# Patient Record
Sex: Male | Born: 1990 | Race: White | Hispanic: No | Marital: Single | Smoking: Never smoker
Health system: Southern US, Community
[De-identification: ages and names within clinical notes are randomized; demographics above are authoritative.]

## PROBLEM LIST (undated history)

## (undated) DIAGNOSIS — F988 Other specified behavioral and emotional disorders with onset usually occurring in childhood and adolescence: Secondary | ICD-10-CM

## (undated) DIAGNOSIS — F32A Depression, unspecified: Secondary | ICD-10-CM

## (undated) DIAGNOSIS — J302 Other seasonal allergic rhinitis: Secondary | ICD-10-CM

## (undated) DIAGNOSIS — F329 Major depressive disorder, single episode, unspecified: Secondary | ICD-10-CM

## (undated) DIAGNOSIS — F419 Anxiety disorder, unspecified: Secondary | ICD-10-CM

## (undated) HISTORY — DX: Other specified behavioral and emotional disorders with onset usually occurring in childhood and adolescence: F98.8

---
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
## 2012-02-10 ENCOUNTER — Emergency Department (HOSPITAL_COMMUNITY)
Admission: EM | Admit: 2012-02-10 | Discharge: 2012-02-10 | Disposition: A | Discharge status: Home / Self Care | Payer: Medicaid Other | Attending: Emergency Medicine | Admitting: Emergency Medicine

## 2012-02-10 ENCOUNTER — Encounter (HOSPITAL_COMMUNITY): Payer: Self-pay | Admitting: *Deleted

## 2012-02-10 DIAGNOSIS — W261XXA Contact with sword or dagger, initial encounter: Secondary | ICD-10-CM | POA: Insufficient documentation

## 2012-02-10 DIAGNOSIS — W260XXA Contact with knife, initial encounter: Secondary | ICD-10-CM | POA: Insufficient documentation

## 2012-02-10 DIAGNOSIS — S61209A Unspecified open wound of unspecified finger without damage to nail, initial encounter: Secondary | ICD-10-CM | POA: Insufficient documentation

## 2012-02-10 DIAGNOSIS — IMO0001 Reserved for inherently not codable concepts without codable children: Secondary | ICD-10-CM

## 2012-02-10 DIAGNOSIS — Z23 Encounter for immunization: Secondary | ICD-10-CM | POA: Insufficient documentation

## 2012-02-10 MED ORDER — BACITRACIN-NEOMYCIN-POLYMYXIN 400-5-5000 EX OINT
TOPICAL_OINTMENT | Freq: Once | CUTANEOUS | Status: AC
  Administered 2012-02-10: 1 via TOPICAL
  Filled 2012-02-10: qty 1

## 2012-02-10 MED ORDER — LIDOCAINE HCL (PF) 1 % IJ SOLN
INTRAMUSCULAR | Status: AC
  Administered 2012-02-10: 17:00:00
  Filled 2012-02-10: qty 5

## 2012-02-10 MED ORDER — TETANUS-DIPHTH-ACELL PERTUSSIS 5-2.5-18.5 LF-MCG/0.5 IM SUSP
0.5000 mL | Freq: Once | INTRAMUSCULAR | Status: AC
  Administered 2012-02-10: 0.5 mL via INTRAMUSCULAR
  Filled 2012-02-10: qty 0.5

## 2012-02-10 NOTE — ED Notes (Addendum)
C/o laceration to left index finger from sharpening a knife. Bleeding controlled at triage

## 2012-02-10 NOTE — ED Notes (Signed)
Discharge instructions reviewed with pt, questions answered. Pt verbalized understanding.  

## 2012-02-10 NOTE — ED Provider Notes (Signed)
Medical screening examination/treatment/procedure(s) were performed by non-physician practitioner and as supervising physician I was immediately available for consultation/collaboration.  Raeford Razor, MD 02/10/12 2044

## 2012-02-10 NOTE — ED Provider Notes (Signed)
History     CSN: [id number]  Arrival date & time 02/10/12  1356   First MD Initiated Contact with Patient 02/10/12 1453      Chief Complaint  Patient presents with  . Extremity Laceration    (Consider location/radiation/quality/duration/timing/severity/associated sxs/prior treatment) HPI Comments: Cut L second finger while sharpening his pocket knife.  ? Last dT.  The history is provided by the patient. No language interpreter was used.    History reviewed. No pertinent past medical history.  History reviewed. No pertinent past surgical history.  No family history on file.  History  Substance Use Topics  . Smoking status: Never Smoker   . Smokeless tobacco: Current User    Types: Chew  . Alcohol Use: No      Review of Systems  Constitutional: Negative for fever and chills.  Skin: Positive for wound.  Neurological: Negative for weakness and numbness.  All other systems reviewed and are negative.    Allergies  Iohexol  Home Medications   Current Outpatient Rx  Name Route Sig Dispense Refill  . CETIRIZINE HCL 10 MG PO TABS Oral Take 10 mg by mouth daily.      BP 115/57  Pulse 65  Temp 98.3 F (36.8 C) (Oral)  Resp 18  Ht 5\' 10"  (1.778 m)  Wt 133 lb (60.328 kg)  BMI 19.08 kg/m2  SpO2 100%  Physical Exam  Nursing note and vitals reviewed. Constitutional: He is oriented to person, place, and time. He appears well-developed and well-nourished.  HENT:  Head: Normocephalic and atraumatic.  Eyes: EOM are normal.  Neck: Normal range of motion.  Cardiovascular: Normal rate, regular rhythm, normal heart sounds and intact distal pulses.   Pulmonary/Chest: Effort normal and breath sounds normal. No respiratory distress.  Abdominal: Soft. He exhibits no distension. There is no tenderness.  Musculoskeletal: Normal range of motion. He exhibits tenderness.       Left hand: He exhibits tenderness and laceration. He exhibits normal range of motion, no bony  tenderness, normal capillary refill, no deformity and no swelling. normal sensation noted. Normal strength noted.       Hands: Neurological: He is alert and oriented to person, place, and time.  Skin: Skin is warm and dry.  Psychiatric: He has a normal mood and affect. Judgment normal.    ED Course  LACERATION REPAIR Date/Time: 02/10/2012 4:00 PM Performed by: Evalina Field Authorized by: Evalina Field Consent: Verbal consent obtained. Written consent not obtained. Consent given by: patient Patient understanding: patient states understanding of the procedure being performed Patient consent: the patient's understanding of the procedure matches consent given Site marked: the operative site was not marked Imaging studies: imaging studies not available Patient identity confirmed: verbally with patient Time out: Immediately prior to procedure a "time out" was called to verify the correct patient, procedure, equipment, support staff and site/side marked as required. Laceration length: 2 cm Foreign bodies: no foreign bodies Tendon involvement: none Nerve involvement: none Vascular damage: no Anesthesia: local infiltration Local anesthetic: lidocaine 1% without epinephrine Anesthetic total: 2 ml Patient sedated: no Preparation: Patient was prepped and draped in the usual sterile fashion. Irrigation solution: saline Irrigation method: syringe Amount of cleaning: standard Debridement: none Degree of undermining: none Skin closure: 4-0 nylon Number of sutures: 4 Approximation: close Approximation difficulty: simple Dressing: 4x4 sterile gauze and antibiotic ointment Patient tolerance: Patient tolerated the procedure well with no immediate complications.   (including critical care time)  Labs Reviewed - No data to display  No results found.   1. Laceration of second finger, left       MDM  Simple laceration repair. Wash/abx oint BID Suture removal in 8-10  days.        Evalina Field, [place] 02/10/12 [phone]

## 2012-04-18 ENCOUNTER — Emergency Department (HOSPITAL_COMMUNITY)
Admission: EM | Admit: 2012-04-18 | Discharge: 2012-04-18 | Disposition: A | Discharge status: Home / Self Care | Payer: Medicaid Other | Attending: Emergency Medicine | Admitting: Emergency Medicine

## 2012-04-18 ENCOUNTER — Encounter (HOSPITAL_COMMUNITY): Payer: Self-pay | Admitting: Emergency Medicine

## 2012-04-18 DIAGNOSIS — S058X9A Other injuries of unspecified eye and orbit, initial encounter: Secondary | ICD-10-CM | POA: Insufficient documentation

## 2012-04-18 DIAGNOSIS — H538 Other visual disturbances: Secondary | ICD-10-CM | POA: Insufficient documentation

## 2012-04-18 DIAGNOSIS — J301 Allergic rhinitis due to pollen: Secondary | ICD-10-CM | POA: Insufficient documentation

## 2012-04-18 DIAGNOSIS — Y929 Unspecified place or not applicable: Secondary | ICD-10-CM | POA: Insufficient documentation

## 2012-04-18 DIAGNOSIS — Y939 Activity, unspecified: Secondary | ICD-10-CM | POA: Insufficient documentation

## 2012-04-18 DIAGNOSIS — S0500XA Injury of conjunctiva and corneal abrasion without foreign body, unspecified eye, initial encounter: Secondary | ICD-10-CM

## 2012-04-18 DIAGNOSIS — X58XXXA Exposure to other specified factors, initial encounter: Secondary | ICD-10-CM | POA: Insufficient documentation

## 2012-04-18 HISTORY — DX: Other seasonal allergic rhinitis: J30.2

## 2012-04-18 MED ORDER — GENTAMICIN SULFATE 0.3 % OP SOLN: 2.0000 [drp] | OPHTHALMIC | Status: DC

## 2012-04-18 NOTE — ED Provider Notes (Signed)
History   This chart was scribed for Geoffery Lyons, MD by Toya Smothers, ED Scribe. The patient was seen in room APA10/APA10. Patient's care was started at 0649.  CSN: [id number]  Arrival date & time 04/18/12  7829   First MD Initiated Contact with Patient 04/18/12 0710      Chief Complaint  Patient presents with  . Eye Pain   Patient is a 21 y.o. male presenting with eye pain. The history is provided by the patient. No language interpreter was used.  Eye Pain This is a new problem. The current episode started 2 days ago. The problem occurs constantly. The problem has not changed since onset.Pertinent negatives include no chest pain, no abdominal pain, no headaches and no shortness of breath. Nothing aggravates the symptoms. Nothing relieves the symptoms. He has tried water for the symptoms. The treatment provided no relief.    Alan Hess is a 21 y.o. male who presents to the Emergency Department complaining of 2 days of new, constant, unchanged, moderate left eye pain with associated blurred vision after possible foreign body. Pain is worse in the morning and Pt denotes associated clear discharge. Symptoms have not been treated PTA. No fever, chills, cough, congestion, rhinorrhea, chest pain, SOB, or n/v/d.   Past Medical History  Diagnosis Date  . Seasonal allergies     No past surgical history on file.  No family history on file.  History  Substance Use Topics  . Smoking status: Never Smoker   . Smokeless tobacco: Current User    Types: Chew  . Alcohol Use: No    Review of Systems  Eyes: Positive for pain. Negative for photophobia.  Respiratory: Negative for shortness of breath.   Cardiovascular: Negative for chest pain.  Gastrointestinal: Negative for abdominal pain.  Neurological: Negative for headaches.  All other systems reviewed and are negative.    Allergies  Iohexol  Home Medications   Current Outpatient Rx  Name  Route  Sig  Dispense  Refill  .  CETIRIZINE HCL 10 MG PO TABS   Oral   Take 10 mg by mouth daily.           BP 138/71  Pulse 114  Temp 97.7 F (36.5 C) (Oral)  Resp 16  Ht 5\' 10"  (1.778 m)  Wt 130 lb (58.968 kg)  BMI 18.65 kg/m2  SpO2 98%  Physical Exam  Nursing note and vitals reviewed. Constitutional:       Awake, alert, nontoxic appearance.  HENT:  Head: Atraumatic.  Eyes: Pupils are equal, round, and reactive to light. Right eye exhibits no discharge. Left eye exhibits no discharge.       Left cornea has a small abrasion centrally located. There is mild discharge and injection of the conjunctiva.  Neck: Neck supple.  Pulmonary/Chest: Effort normal. He exhibits no tenderness.  Abdominal: Soft. There is no tenderness. There is no rebound.  Musculoskeletal: He exhibits no tenderness.       Baseline ROM, no obvious new focal weakness.  Neurological:       Mental status and motor strength appears baseline for patient and situation.  Skin: No rash noted.  Psychiatric: He has a normal mood and affect.    ED Course  Procedures DIAGNOSTIC STUDIES: Oxygen Saturation is 98% on room air, normal by my interpretation.    COORDINATION OF CARE: 19:15- Evaluated Pt. Pt is awake, alert, and without distress 19:20- Patient informed of clinical course, understand medical decision-making process, and agree with  plan.    Labs Reviewed - No data to display No results found.   No diagnosis found.    MDM  Looks to have a corneal abrasion.  Will treat with antibiotic drops.  To discharge and return prn.    I personally performed the services described in this documentation, which was scribed in my presence. The recorded information has been reviewed and is accurate.         Geoffery Lyons, MD 04/18/12 [phone]

## 2012-04-18 NOTE — ED Notes (Signed)
Pt getting leaves up two days ago and feels like something is in eye. Has flushed it out but pain and irritation remains. Wakes up with drainage making it difficult to see.

## 2012-05-08 ENCOUNTER — Emergency Department (HOSPITAL_COMMUNITY)
Admission: EM | Admit: 2012-05-08 | Discharge: 2012-05-08 | Disposition: A | Discharge status: Home / Self Care | Payer: Medicaid Other | Attending: Emergency Medicine | Admitting: Emergency Medicine

## 2012-05-08 ENCOUNTER — Encounter (HOSPITAL_COMMUNITY): Payer: Self-pay | Admitting: Emergency Medicine

## 2012-05-08 DIAGNOSIS — L739 Follicular disorder, unspecified: Secondary | ICD-10-CM

## 2012-05-08 DIAGNOSIS — Y929 Unspecified place or not applicable: Secondary | ICD-10-CM | POA: Insufficient documentation

## 2012-05-08 DIAGNOSIS — Y939 Activity, unspecified: Secondary | ICD-10-CM | POA: Insufficient documentation

## 2012-05-08 DIAGNOSIS — L738 Other specified follicular disorders: Secondary | ICD-10-CM | POA: Insufficient documentation

## 2012-05-08 DIAGNOSIS — Z79899 Other long term (current) drug therapy: Secondary | ICD-10-CM | POA: Insufficient documentation

## 2012-05-08 MED ORDER — DOXYCYCLINE HYCLATE 100 MG PO CAPS: 100.0000 mg | ORAL_CAPSULE | Freq: Two times a day (BID) | ORAL | Status: DC

## 2012-05-08 NOTE — ED Provider Notes (Signed)
History  This chart was scribed for Alan Co, MD by Ardeen Jourdain, ED Scribe. This patient was seen in room APA07/APA07 and the patient's care was started at 0730.  CSN: [id number]  Arrival date & time 05/08/12  2952   First MD Initiated Contact with Patient 05/08/12 0730      Chief Complaint  Patient presents with  . Insect Bite    The history is provided by the patient. No language interpreter was used.    Alan Hess is a 21 y.o. male who presents to the Emergency Department complaining of gradually improving an area of redness on his left anterior lower leg with associated fever, soreness and swelling . He states the area of redness began 1 day ago. He denies seeing a spider bite the area. He reports putting "fat back" with no relief from the swelling. He states the area has been improved by nothing. Pt does not have a h/o any pertinent or chronic medical conditions. Pt denies smoking and alcohol use.   Past Medical History  Diagnosis Date  . Seasonal allergies     History reviewed. No pertinent past surgical history.  History reviewed. No pertinent family history.  History  Substance Use Topics  . Smoking status: Never Smoker   . Smokeless tobacco: Current User    Types: Chew  . Alcohol Use: No      Review of Systems  All other systems reviewed and are negative.  A complete 10 system review of systems was obtained and all systems are negative except as noted in the HPI and PMH.    Allergies  Iohexol  Home Medications   Current Outpatient Rx  Name  Route  Sig  Dispense  Refill  . CETIRIZINE HCL 10 MG PO TABS   Oral   Take 10 mg by mouth daily.         . GENTAMICIN SULFATE 0.3 % OP SOLN   Left Eye   Place 2 drops into the left eye every 4 (four) hours.   5 mL   0     Triage Vitals: BP 122/64  Pulse 67  Temp 98.6 F (37 C) (Oral)  Resp 17  SpO2 97%  Physical Exam  Constitutional: He is oriented to person, place, and time. He  appears well-developed and well-nourished.  HENT:  Head: Normocephalic.  Eyes: EOM are normal.  Neck: Normal range of motion.  Pulmonary/Chest: Effort normal.  Abdominal: He exhibits no distension.  Musculoskeletal: Normal range of motion.  Neurological: He is alert and oriented to person, place, and time.  Skin: Skin is warm and dry. There is erythema.       1.5 cm area of erythema to the left anterior midline tibia. No fluctuation with mild tenderness, no drainage, no spreading erythema   Psychiatric: He has a normal mood and affect.    ED Course  Procedures (including critical care time)  DIAGNOSTIC STUDIES: Oxygen Saturation is 97% on room air, normal by my interpretation.    COORDINATION OF CARE:  7:43 AM: Discussed treatment plan which includes ibuprofen and warm compresses with pt at bedside and pt agreed to plan.    Labs Reviewed - No data to display No results found.   1. Folliculitis       MDM  The patient has evidence of folliculitis on his left anterior tibia.  No secondary signs of spreading erythema.  No systemic symptoms.  No abscess at this time requiring incision and drainage.  Warm compresses and antibiotics.  Instructed to return to the ER for new or worsening symptoms      I personally performed the services described in this documentation, which was scribed in my presence. The recorded information has been reviewed and is accurate.      Alan Co, MD 05/08/12 [phone]

## 2012-05-08 NOTE — ED Notes (Signed)
Pt noticed bump on L anterior lower leg yesterday. States was swollen but put fatback on it and swelling came down. Area is red and size of quarter. nad

## 2012-08-22 ENCOUNTER — Encounter: Payer: Self-pay | Admitting: Family Medicine

## 2012-08-22 ENCOUNTER — Telehealth: Payer: Self-pay | Admitting: Family Medicine

## 2012-08-22 DIAGNOSIS — F988 Other specified behavioral and emotional disorders with onset usually occurring in childhood and adolescence: Secondary | ICD-10-CM | POA: Insufficient documentation

## 2012-08-22 DIAGNOSIS — J302 Other seasonal allergic rhinitis: Secondary | ICD-10-CM | POA: Insufficient documentation

## 2012-08-22 NOTE — Telephone Encounter (Signed)
ntbs

## 2012-08-22 NOTE — Telephone Encounter (Signed)
Please call patient and schedule appt to have medication refilled per providers request. 

## 2012-08-22 NOTE — Telephone Encounter (Signed)
Need approval for controlled medication.  Last office visit for follow up for ADD 12/2011.  Last refill was 07/16/2012. Adderral 20mg  BID

## 2012-08-25 ENCOUNTER — Ambulatory Visit: Payer: Self-pay | Admitting: Physician Assistant

## 2012-08-25 NOTE — Telephone Encounter (Signed)
Pt is coming in for appt today (08/25/12).Oren Section

## 2012-08-27 ENCOUNTER — Ambulatory Visit (INDEPENDENT_AMBULATORY_CARE_PROVIDER_SITE_OTHER): Payer: Medicaid Other | Admitting: Physician Assistant

## 2012-08-27 ENCOUNTER — Encounter: Payer: Self-pay | Admitting: Physician Assistant

## 2012-08-27 VITALS — BP 130/74 | HR 72 | Temp 98.8°F | Resp 20 | Ht 69.0 in | Wt 137.0 lb

## 2012-08-27 DIAGNOSIS — F988 Other specified behavioral and emotional disorders with onset usually occurring in childhood and adolescence: Secondary | ICD-10-CM

## 2012-08-27 MED ORDER — AMPHETAMINE-DEXTROAMPHETAMINE 20 MG PO TABS: 20.0000 mg | ORAL_TABLET | Freq: Two times a day (BID) | ORAL | Status: DC

## 2012-08-27 NOTE — Progress Notes (Signed)
   Patient ID: Alan Hess MRN: [id number], DOB: 12-16-1990, 22 y.o. Date of Encounter: 08/27/2012, 5:10 PM    Chief Complaint:  Chief Complaint  Patient presents with  . Medication Refill    Adderall     HPI: 22 y.o. year old male here to f/u ADD. On Adderall 20mg  BID. Says this is working well. 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 This job pays more so changed jobs. Is "putting off getting GED" b/c busy with a lot of work hours. 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 Current Adderall still works well for him. Able to focus and pay attention to his work and complete tasks. Is eating protein bars, drinking protein drinks, trying to gain weight.  No insomnia, palitation, chest pain, abdominal pain.    Home Meds: Current Outpatient Prescriptions on File Prior to Visit  Medication Sig Dispense Refill  . cetirizine (ZYRTEC) 10 MG tablet Take 10 mg by mouth daily.      Marland Kitchen doxycycline (VIBRAMYCIN) 100 MG capsule Take 1 capsule (100 mg total) by mouth 2 (two) times daily.  14 capsule  0  . gentamicin (GARAMYCIN) 0.3 % ophthalmic solution Place 2 drops into the left eye every 4 (four) hours.  5 mL  0   No current facility-administered medications on file prior to visit.    Allergies:  Allergies  Allergen Reactions  . Iohexol      Code: HIVES, Desc: became itchy after injection, Onset Date: [id number]       Review of Systems: Constitutional: negative for chills, fever, night sweats, weight changes, or fatigue  HEENT: negative for vision changes, hearing loss, congestion, rhinorrhea, ST, epistaxis, or sinus pressure Cardiovascular: negative for chest pain or palpitations Respiratory: negative for hemoptysis, wheezing, shortness of breath, or cough Abdominal: negative for abdominal pain, nausea, vomiting, diarrhea, or constipation Dermatological: negative for rash Neurologic: negative for headache, dizziness, or  syncope    Physical Exam: Blood pressure 130/74, pulse 72, temperature 98.8 F (37.1 C), temperature source Oral, resp. rate 20, height 5\' 9"  (1.753 m), weight 137 lb (62.143 kg)., Body mass index is 20.22 kg/(m^2). General: Well developed, well nourished, in no acute distress. Neck: Supple. No thyromegaly. Full ROM. No lymphadenopathy. Lungs: Clear bilaterally to auscultation without wheezes, rales, or rhonchi. Breathing is unlabored. Heart: RRR with S1 S2. No murmurs, rubs, or gallops appreciated. Msk:  Strength and tone normal for age. Extremities/Skin: Warm and dry. No clubbing or cyanosis. No edema. No rashes or suspicious lesions. Neuro: Alert and oriented X 3. Moves all extremities spontaneously. Gait is normal. CNII-XII grossly in tact. Psych:  Responds to questions appropriately with a normal affect.    ASSESSMENT AND PLAN:  22 y.o. year old male with  1. ADD (attention deficit disorder) Cont Adderall 20mg  BID # 60 / 0. I gave him 2 Prescriptions today. One for today, one to fill 09/27/12.  ROV 6 months   Murray Hodgkins [place], [place], [facility] 08/27/2012 5:10 PM

## 2012-09-01 ENCOUNTER — Ambulatory Visit (INDEPENDENT_AMBULATORY_CARE_PROVIDER_SITE_OTHER): Payer: Medicaid Other | Admitting: Physician Assistant

## 2012-09-01 VITALS — BP 110/66 | HR 100 | Temp 98.5°F | Resp 18 | Ht 69.0 in | Wt 134.0 lb

## 2012-09-01 DIAGNOSIS — G4700 Insomnia, unspecified: Secondary | ICD-10-CM

## 2012-09-01 MED ORDER — ZOLPIDEM TARTRATE 5 MG PO TABS: 5.0000 mg | ORAL_TABLET | Freq: Every evening | ORAL | Status: DC | PRN

## 2012-09-02 NOTE — Progress Notes (Signed)
Patient ID: Alan Hess MRN: [id number], DOB: 06/21/1990, 22 y.o. Date of Encounter: 09/02/2012, 8:46 AM   Chief Complaint:  Chief Complaint  Patient presents with  . c/o insomnia  maybe get 2-3hrs a night  headaches, mind raci    HPI: 22 y.o. year old male is having problems with insomnia. Says he has "always had a problem getting really good sleep" but it is worse recently. Takes a while to fall asleep and even after asleep, does not get a good sleep. Tosses and turns a lot. Sometimes completely awakens during the night after falling asleep. Was having problems with this prior to starting Adderall. Also, has been on Adderall a long time now but the insomnia has only recently worsened. Takes first dose of Adderall at 5:15 am before work. Takes 2nd dose at 12:30 during lunch break.  Has tried otc "natural supplement called Dream Water" but this did not help. Relaxes prior to bed time. Consumes no caffeine after 4pm.     Past Medical History  Diagnosis Date  . Seasonal allergies   . ADD (attention deficit disorder)      Home Meds: Current Outpatient Prescriptions on File Prior to Visit  Medication Sig Dispense Refill  . amphetamine-dextroamphetamine (ADDERALL) 20 MG tablet Take 1 tablet (20 mg total) by mouth 2 (two) times daily.  60 tablet  0  . cetirizine (ZYRTEC) 10 MG tablet Take 10 mg by mouth daily.      Marland Kitchen doxycycline (VIBRAMYCIN) 100 MG capsule Take 1 capsule (100 mg total) by mouth 2 (two) times daily.  14 capsule  0  . gentamicin (GARAMYCIN) 0.3 % ophthalmic solution Place 2 drops into the left eye every 4 (four) hours.  5 mL  0  . loratadine (CLARITIN) 10 MG tablet Take 10 mg by mouth daily.       No current facility-administered medications on file prior to visit.    Allergies:  Allergies  Allergen Reactions  . Iohexol      Code: HIVES, Desc: became itchy after injection, Onset Date: [id number]     History   Social History  . Marital Status: Single     Spouse Name: N/A    Number of Children: N/A  . Years of Education: N/A   Occupational History  . Not on file.   Social History Main Topics  . Smoking status: Never Smoker   . Smokeless tobacco: Current User    Types: Chew  . Alcohol Use: No  . Drug Use: No  . Sexually Active:    Other Topics Concern  . Not on file   Social History Narrative  . No narrative on file     Review of Systems: Constitutional: negative for chills, fever, night sweats, weight changes, or fatigue  HEENT: negative for vision changes or hearing loss Cardiovascular: negative for chest pain or palpitations Respiratory: negative for hemoptysis, wheezing, shortness of breath, or cough Dermatological: negative for rash Neurologic: negative for headache, dizziness, or syncope All other systems reviewed and are otherwise negative with the exception to those above and in the HPI.   Physical Exam: Blood pressure 110/66, pulse 100, temperature 98.5 F (36.9 C), temperature source Oral, resp. rate 18, height 5\' 9"  (1.753 m), weight 134 lb (60.782 kg)., Body mass index is 19.78 kg/(m^2). General: Well developed, well nourished, in no acute distress. Neck: Supple. No thyromegaly. Full ROM. No lymphadenopathy. Lungs: Clear bilaterally to auscultation without wheezes, rales, or rhonchi. Breathing is unlabored. Heart: Regular rhythm.  No murmurs, rubs, or gallops. Msk:  Strength and tone normal for age. Extremities/Skin: Warm and dry. No clubbing or cyanosis. No edema. No rashes or suspicious lesions. Neuro: Alert and oriented X 3. Moves all extremities spontaneously. Gait is normal. CNII-XII grossly in tact. Psych:  Responds to questions appropriately with a normal affect.     ASSESSMENT AND PLAN:  22 y.o. year old male with  1. Insomnia - zolpidem (AMBIEN) 5 MG tablet; Take 1 tablet (5 mg total) by mouth at bedtime as needed for sleep.  Dispense: 15 tablet; Refill: 1   Signed,  [address] [place], [place],  [facility] 09/02/2012 8:46 AM

## 2012-09-22 ENCOUNTER — Ambulatory Visit: Payer: Medicaid Other | Admitting: Physician Assistant

## 2012-10-16 ENCOUNTER — Telehealth: Payer: Self-pay | Admitting: Physician Assistant

## 2012-10-16 MED ORDER — AMPHETAMINE-DEXTROAMPHETAMINE 20 MG PO TABS: 20.0000 mg | ORAL_TABLET | Freq: Two times a day (BID) | ORAL | Status: DC

## 2012-10-16 NOTE — Telephone Encounter (Addendum)
Just seen 08/27/12.  Last refill 08/27/12.  Rx printed for provider signature.  Pt called told ready for pick up in AM

## 2012-10-17 NOTE — Telephone Encounter (Signed)
Rx printed, signed, approved.

## 2012-10-28 ENCOUNTER — Encounter (HOSPITAL_COMMUNITY): Payer: Self-pay

## 2012-10-28 ENCOUNTER — Telehealth: Payer: Self-pay | Admitting: Family Medicine

## 2012-10-28 ENCOUNTER — Emergency Department (HOSPITAL_COMMUNITY)
Admission: EM | Admit: 2012-10-28 | Discharge: 2012-10-28 | Disposition: A | Discharge status: Home / Self Care | Payer: Self-pay | Attending: Emergency Medicine | Admitting: Emergency Medicine

## 2012-10-28 DIAGNOSIS — Y9389 Activity, other specified: Secondary | ICD-10-CM | POA: Insufficient documentation

## 2012-10-28 DIAGNOSIS — T622X1A Toxic effect of other ingested (parts of) plant(s), accidental (unintentional), initial encounter: Secondary | ICD-10-CM | POA: Insufficient documentation

## 2012-10-28 DIAGNOSIS — F988 Other specified behavioral and emotional disorders with onset usually occurring in childhood and adolescence: Secondary | ICD-10-CM | POA: Insufficient documentation

## 2012-10-28 DIAGNOSIS — Z79899 Other long term (current) drug therapy: Secondary | ICD-10-CM | POA: Insufficient documentation

## 2012-10-28 DIAGNOSIS — Y9289 Other specified places as the place of occurrence of the external cause: Secondary | ICD-10-CM | POA: Insufficient documentation

## 2012-10-28 DIAGNOSIS — L255 Unspecified contact dermatitis due to plants, except food: Secondary | ICD-10-CM | POA: Insufficient documentation

## 2012-10-28 MED ORDER — DEXAMETHASONE SODIUM PHOSPHATE 4 MG/ML IJ SOLN
10.0000 mg | Freq: Once | INTRAMUSCULAR | Status: AC
  Administered 2012-10-28: 10 mg via INTRAMUSCULAR
  Filled 2012-10-28: qty 3

## 2012-10-28 MED ORDER — PREDNISONE 10 MG PO TABS: ORAL_TABLET | ORAL | Status: DC

## 2012-10-28 NOTE — Telephone Encounter (Signed)
Yes pt seen in ER

## 2012-10-28 NOTE — ED Notes (Signed)
Pt reports broke out in rash from poison oak on arms and abd x 3 days.  Has been using topical cream without relief.

## 2012-10-28 NOTE — Telephone Encounter (Signed)
Looks like he went to er today, didn't he? If not, he can have prednisone taper pack.

## 2012-10-28 NOTE — ED Provider Notes (Signed)
History     CSN: [id number]  Arrival date & time 10/28/12  1157   First MD Initiated Contact with Patient 10/28/12 1222      Chief Complaint  Patient presents with  . Poison Oak    (Consider location/radiation/quality/duration/timing/severity/associated sxs/prior treatment) HPI Comments: Alan Hess is a 22 y.o. male who presents to the Emergency Department complaining of rash and itching to his  Bilateral arms and abdomen for 3 days.  States the rash began after he was moving bricks outside and he believes it is related to poison oak exposure.  States rash began weeping fluids last evening.  States he has been using Calamine lotion w/o relief. He denies fever,chills, swelling or pain  The history is provided by the patient.    Past Medical History  Diagnosis Date  . Seasonal allergies   . ADD (attention deficit disorder)     History reviewed. No pertinent past surgical history.  No family history on file.  History  Substance Use Topics  . Smoking status: Never Smoker   . Smokeless tobacco: Current User    Types: Chew  . Alcohol Use: No      Review of Systems  Constitutional: Negative for fever, chills, activity change and appetite change.  HENT: Negative for sore throat, facial swelling, trouble swallowing, neck pain and neck stiffness.   Respiratory: Negative for chest tightness, shortness of breath and wheezing.   Gastrointestinal: Negative for nausea and vomiting.  Musculoskeletal: Negative for myalgias and arthralgias.  Skin: Positive for rash. Negative for color change and wound.  Neurological: Negative for dizziness, weakness, numbness and headaches.  All other systems reviewed and are negative.    Allergies  Iohexol  Home Medications   Current Outpatient Rx  Name  Route  Sig  Dispense  Refill  . amphetamine-dextroamphetamine (ADDERALL) 20 MG tablet   Oral   Take 1 tablet (20 mg total) by mouth 2 (two) times daily.   60 tablet   0   .  cetirizine (ZYRTEC) 10 MG tablet   Oral   Take 10 mg by mouth daily.         Marland Kitchen doxycycline (VIBRAMYCIN) 100 MG capsule   Oral   Take 1 capsule (100 mg total) by mouth 2 (two) times daily.   14 capsule   0   . gentamicin (GARAMYCIN) 0.3 % ophthalmic solution   Left Eye   Place 2 drops into the left eye every 4 (four) hours.   5 mL   0   . loratadine (CLARITIN) 10 MG tablet   Oral   Take 10 mg by mouth daily.         Marland Kitchen zolpidem (AMBIEN) 5 MG tablet   Oral   Take 1 tablet (5 mg total) by mouth at bedtime as needed for sleep.   15 tablet   1     BP 130/70  Pulse 84  Temp(Src) 97.1 F (36.2 C) (Oral)  Resp 20  Ht 5\' 10"  (1.778 m)  Wt 140 lb (63.504 kg)  BMI 20.09 kg/m2  Physical Exam  Nursing note and vitals reviewed. Constitutional: He is oriented to person, place, and time. He appears well-developed and well-nourished. No distress.  HENT:  Head: Normocephalic and atraumatic.  Mouth/Throat: Oropharynx is clear and moist.  Neck: Normal range of motion. Neck supple.  Cardiovascular: Normal rate, regular rhythm, normal heart sounds and intact distal pulses.   No murmur heard. Pulmonary/Chest: Effort normal and breath sounds normal. No respiratory  distress.  Musculoskeletal: He exhibits no edema and no tenderness.  Lymphadenopathy:    He has no cervical adenopathy.  Neurological: He is alert and oriented to person, place, and time. He exhibits normal muscle tone. Coordination normal.  Skin: Skin is warm. Rash noted. There is erythema.  Erythematous papules and vesicles to the bilateral forearms and abdomen.  Pt has lesions covered with Calamine lotion.  No edema.  Radial pulse is brisk bilaterally, distal sensation intact    ED Course  Procedures (including critical care time)  Labs Reviewed - No data to display No results found.      MDM    Scattered vesicular rash to the bilateral forearms.  Mild serous drainage present.  Appears c/w plant dermatitis.   No edema.      Pt appears stable for d/c, agrees to close f/u with his PMD or to return here if needed    Kyliegh Jester L. Trisha Mangle, PA-C 10/28/12 2204

## 2012-10-30 NOTE — ED Provider Notes (Signed)
Medical screening examination/treatment/procedure(s) were performed by non-physician practitioner and as supervising physician I was immediately available for consultation/collaboration.   Shelda Jakes, MD 10/30/12 [phone]

## 2012-11-25 ENCOUNTER — Telehealth: Payer: Self-pay | Admitting: Physician Assistant

## 2012-11-26 MED ORDER — AMPHETAMINE-DEXTROAMPHETAMINE 20 MG PO TABS: 20.0000 mg | ORAL_TABLET | Freq: Two times a day (BID) | ORAL | Status: DC

## 2012-11-26 NOTE — Telephone Encounter (Signed)
Approvede, signed

## 2012-11-26 NOTE — Telephone Encounter (Signed)
Recent OV,  refill appropriate.  Rx printed for provider signature

## 2012-12-23 ENCOUNTER — Telehealth: Payer: Self-pay | Admitting: Family Medicine

## 2012-12-23 MED ORDER — AMPHETAMINE-DEXTROAMPHETAMINE 20 MG PO TABS: 20.0000 mg | ORAL_TABLET | Freq: Two times a day (BID) | ORAL | Status: DC

## 2012-12-23 NOTE — Telephone Encounter (Signed)
Last refill 11/26/12.  Not due for OV til end of September.  Rx printed for signature for pick up Friday

## 2012-12-23 NOTE — Telephone Encounter (Signed)
He has seen Shon Hale for this.

## 2012-12-23 NOTE — Telephone Encounter (Signed)
Ok to refill 

## 2012-12-24 NOTE — Telephone Encounter (Signed)
Pt aware prescription will be ready for pick up after 8AM Friday

## 2013-01-10 ENCOUNTER — Emergency Department (HOSPITAL_COMMUNITY)
Admission: EM | Admit: 2013-01-10 | Discharge: 2013-01-10 | Disposition: A | Discharge status: Home / Self Care | Payer: Self-pay | Attending: Emergency Medicine | Admitting: Emergency Medicine

## 2013-01-10 ENCOUNTER — Encounter (HOSPITAL_COMMUNITY): Payer: Self-pay

## 2013-01-10 DIAGNOSIS — Z79899 Other long term (current) drug therapy: Secondary | ICD-10-CM | POA: Insufficient documentation

## 2013-01-10 DIAGNOSIS — F988 Other specified behavioral and emotional disorders with onset usually occurring in childhood and adolescence: Secondary | ICD-10-CM | POA: Insufficient documentation

## 2013-01-10 DIAGNOSIS — R4182 Altered mental status, unspecified: Secondary | ICD-10-CM | POA: Insufficient documentation

## 2013-01-10 DIAGNOSIS — R1110 Vomiting, unspecified: Secondary | ICD-10-CM | POA: Insufficient documentation

## 2013-01-10 DIAGNOSIS — F1910 Other psychoactive substance abuse, uncomplicated: Secondary | ICD-10-CM

## 2013-01-10 DIAGNOSIS — Z8709 Personal history of other diseases of the respiratory system: Secondary | ICD-10-CM | POA: Insufficient documentation

## 2013-01-10 DIAGNOSIS — F1310 Sedative, hypnotic or anxiolytic abuse, uncomplicated: Secondary | ICD-10-CM | POA: Insufficient documentation

## 2013-01-10 LAB — RAPID URINE DRUG SCREEN, HOSP PERFORMED
Barbiturates: NOT DETECTED
Tetrahydrocannabinol: POSITIVE — AB

## 2013-01-10 LAB — COMPREHENSIVE METABOLIC PANEL
ALT: 14 U/L (ref 0–53)
AST: 19 U/L (ref 0–37)
Alkaline Phosphatase: 63 U/L (ref 39–117)
CO2: 24 mEq/L (ref 19–32)
Chloride: 100 mEq/L (ref 96–112)
GFR calc Af Amer: >90 mL/min (ref >90)
GFR calc non Af Amer: >90 mL/min (ref >90)
Glucose, Bld: 116 mg/dL — ABNORMAL HIGH (ref 70–99)
Potassium: 3.5 mEq/L (ref 3.5–5.1)
Sodium: 136 mEq/L (ref 135–145)
Total Bilirubin: 0.5 mg/dL (ref 0.3–1.2)

## 2013-01-10 LAB — CBC
Hemoglobin: 14.6 g/dL (ref 13.0–17.0)
MCH: 31.4 pg (ref 26.0–34.0)
RBC: 4.65 MIL/uL (ref 4.22–5.81)
WBC: 6.3 10*3/uL (ref 4.0–10.5)

## 2013-01-10 LAB — SALICYLATE LEVEL: Salicylate Lvl: <2 mg/dL — ABNORMAL LOW (ref 2.8–20.0)

## 2013-01-10 MED ORDER — DIPHENHYDRAMINE HCL 50 MG/ML IJ SOLN: 25.0000 mg | Freq: Once | INTRAMUSCULAR | Status: DC

## 2013-01-10 NOTE — ED Notes (Signed)
Still sleeping and cannot stay awake long enough to provide a urine specimen.

## 2013-01-10 NOTE — ED Notes (Signed)
Pt speaking in complete sentences, able to do simple computations, A&O x3, ambulates well at this time.

## 2013-01-10 NOTE — ED Notes (Signed)
Drank too much beer today. Has been vomiting per patient. Has possibly taken 4 pills per sister.

## 2013-01-10 NOTE — ED Provider Notes (Signed)
CSN: [id number]     Arrival date & time 01/10/13  0115 History     First MD Initiated Contact with Patient 01/10/13 0203     Chief Complaint  Patient presents with  . Alcohol Intoxication  . Emesis   (Consider location/radiation/quality/duration/timing/severity/associated sxs/prior Treatment) HPI Comments: 22 year old male with a history of attention deficit disorder who according to his sister who is the primary historian as he had too much alcohol this evening and combine this with approximately 5 Xanax tablets. She states that he abuses pills including pain pills as well as sedatives. She is unsure where he gets the medication but it is not prescribed to him. They do not live together however he has been staying with her over the last weekend. This evening she was witnessing him drinking, she did not seem take the pills, he became more and more somnolent as he drank. She called the paramedics because he was almost unresponsive after drinking. She denies any history of overt depression, he does not have any suicidal thoughts or history or past. He is actively employed with a [employer] for the last week but prior to that had not been working. He also admitted to smoking marijuana this evening.  Level V caveat applies secondary to altered mental status  Patient is a 22 y.o. male presenting with intoxication and vomiting. The history is provided by the patient and a relative.  Alcohol Intoxication  Emesis   Past Medical History  Diagnosis Date  . Seasonal allergies   . ADD (attention deficit disorder)    History reviewed. No pertinent past surgical history. No family history on file. History  Substance Use Topics  . Smoking status: Never Smoker   . Smokeless tobacco: Current User    Types: Chew  . Alcohol Use: No    Review of Systems  Unable to perform ROS: Mental status change  Gastrointestinal: Positive for vomiting.    Allergies  Iohexol  Home Medications    Current Outpatient Rx  Name  Route  Sig  Dispense  Refill  . amphetamine-dextroamphetamine (ADDERALL) 20 MG tablet   Oral   Take 1 tablet (20 mg total) by mouth 2 (two) times daily.   60 tablet   0   . cetirizine (ZYRTEC) 10 MG tablet   Oral   Take 10 mg by mouth daily.         Marland Kitchen ibuprofen (ADVIL,MOTRIN) 200 MG tablet   Oral   Take 400 mg by mouth every 6 (six) hours as needed for pain.         . predniSONE (DELTASONE) 10 MG tablet      Take 6 tablets day one, 5 tablets day two, 4 tablets day three, 3 tablets day four, 2 tablets day five, then 1 tablet day six   21 tablet   0    BP 104/58  Pulse 63  Temp(Src) 97.5 F (36.4 C) (Oral)  Resp 18  SpO2 100% Physical Exam  Nursing note and vitals reviewed. Constitutional: He appears well-developed and well-nourished.  Somnolent, minimally arousable to voice, follows commands  HENT:  Head: Normocephalic and atraumatic.  Mouth/Throat: Oropharynx is clear and moist. No oropharyngeal exudate.  Eyes: Conjunctivae and EOM are normal. Pupils are equal, round, and reactive to light. Right eye exhibits no discharge. Left eye exhibits no discharge. No scleral icterus.  Mild bilateral conjunctival injection  Neck: Normal range of motion. Neck supple. No JVD present. No thyromegaly present.  Cardiovascular: Normal rate, regular rhythm,  normal heart sounds and intact distal pulses.  Exam reveals no gallop and no friction rub.   No murmur heard. Pulmonary/Chest: Effort normal and breath sounds normal. No respiratory distress. He has no wheezes. He has no rales.  Abdominal: Soft. Bowel sounds are normal. He exhibits no distension and no mass. There is no tenderness.  Musculoskeletal: Normal range of motion. He exhibits no edema and no tenderness.  Lymphadenopathy:    He has no cervical adenopathy.  Neurological:  Somnolent, minimally arousable to voice, moves all 4 extremities, normal grips bilaterally, does not speak in response to  questions  Skin: Skin is warm and dry. No rash noted. No erythema.  Psychiatric: He has a normal mood and affect. His behavior is normal.    ED Course   Procedures (including critical care time)  Labs Reviewed  COMPREHENSIVE METABOLIC PANEL - Abnormal; Notable for the following:    Glucose, Bld 116 (*)    All other components within normal limits  ETHANOL - Abnormal; Notable for the following:    Alcohol, Ethyl (B) 107 (*)    All other components within normal limits  URINE RAPID DRUG SCREEN (HOSP PERFORMED) - Abnormal; Notable for the following:    Benzodiazepines POSITIVE (*)    Tetrahydrocannabinol POSITIVE (*)    All other components within normal limits  SALICYLATE LEVEL - Abnormal; Notable for the following:    Salicylate Lvl <2.0 (*)    All other components within normal limits  CBC  ACETAMINOPHEN LEVEL   No results found. 1. Substance abuse     MDM  The patient has evidence of alcohol intoxication, possibly other substances as well including benzodiazepines, would question other coingestions. No history of suicide, has history of drug abuse to "get high". The patient is too intoxicated to give any other information at this time. Labs, EKG, reevaluate when sober, cardiac monitoring as the patient is impaired at this time.  ED ECG REPORT  I personally interpreted this EKG   Date: 01/10/2013   Rate: 53  Rhythm: sinus bradycardia  QRS Axis: normal  Intervals: normal  ST/T Wave abnormalities: normal  Conduction Disutrbances:none  Narrative Interpretation:   Old EKG Reviewed: Compared with 04/14/2010, no significant changes other than a slower rate today.  Labs unremarkable overall - pt is now awake and states that he wants no help at this time - he does not view his problem with "pills" as a problem and declines detox program placement.  He has normal mental status and is able to describe why he should not drink ETOH and take pills at the same time.  He will be d/cin the  care of his sister who is sober and will be driving.  Vida Roller, MD 01/10/13 [phone]

## 2013-01-10 NOTE — ED Notes (Signed)
Patient states that he took 5 xanax

## 2013-01-10 NOTE — ED Notes (Signed)
Discharge instructions reviewed with pt, questions answered. Pt verbalized understanding.  

## 2013-01-23 ENCOUNTER — Telehealth: Payer: Self-pay | Admitting: Physician Assistant

## 2013-01-23 NOTE — Telephone Encounter (Signed)
Pt must be seen before Adderall will be refilled due to recent ED visit.

## 2013-01-29 ENCOUNTER — Ambulatory Visit: Payer: Medicaid Other | Admitting: Physician Assistant

## 2013-02-05 ENCOUNTER — Ambulatory Visit: Payer: Self-pay | Admitting: Physician Assistant

## 2013-02-18 ENCOUNTER — Ambulatory Visit (INDEPENDENT_AMBULATORY_CARE_PROVIDER_SITE_OTHER): Payer: Self-pay | Admitting: Physician Assistant

## 2013-02-18 ENCOUNTER — Telehealth: Payer: Self-pay | Admitting: Physician Assistant

## 2013-02-18 ENCOUNTER — Encounter: Payer: Self-pay | Admitting: Physician Assistant

## 2013-02-18 VITALS — BP 100/68 | HR 92 | Temp 98.7°F | Resp 16 | Ht 69.0 in | Wt 137.0 lb

## 2013-02-18 DIAGNOSIS — M542 Cervicalgia: Secondary | ICD-10-CM

## 2013-02-18 DIAGNOSIS — F988 Other specified behavioral and emotional disorders with onset usually occurring in childhood and adolescence: Secondary | ICD-10-CM
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
## 2013-02-18 NOTE — Telephone Encounter (Signed)
Pt has an appointment tomorrow at [facility].

## 2013-02-20 ENCOUNTER — Telehealth: Payer: Self-pay | Admitting: Family Medicine

## 2013-03-05 NOTE — Telephone Encounter (Signed)
closed

## 2013-04-25 ENCOUNTER — Emergency Department (HOSPITAL_COMMUNITY): Payer: Self-pay

## 2013-04-25 ENCOUNTER — Encounter (HOSPITAL_COMMUNITY): Payer: Self-pay | Admitting: Emergency Medicine

## 2013-04-25 ENCOUNTER — Emergency Department (HOSPITAL_COMMUNITY)
Admission: EM | Admit: 2013-04-25 | Discharge: 2013-04-25 | Disposition: A | Discharge status: Home / Self Care | Payer: Self-pay | Attending: Emergency Medicine | Admitting: Emergency Medicine

## 2013-04-25 DIAGNOSIS — Z79899 Other long term (current) drug therapy: Secondary | ICD-10-CM | POA: Insufficient documentation

## 2013-04-25 DIAGNOSIS — F17200 Nicotine dependence, unspecified, uncomplicated: Secondary | ICD-10-CM | POA: Insufficient documentation

## 2013-04-25 DIAGNOSIS — J3489 Other specified disorders of nose and nasal sinuses: Secondary | ICD-10-CM | POA: Insufficient documentation

## 2013-04-25 DIAGNOSIS — R51 Headache: Secondary | ICD-10-CM | POA: Insufficient documentation

## 2013-04-25 DIAGNOSIS — Z8659 Personal history of other mental and behavioral disorders: Secondary | ICD-10-CM | POA: Insufficient documentation

## 2013-04-25 DIAGNOSIS — Z9109 Other allergy status, other than to drugs and biological substances: Secondary | ICD-10-CM | POA: Insufficient documentation

## 2013-04-25 LAB — COMPREHENSIVE METABOLIC PANEL
ALT: 12 U/L (ref 0–53)
AST: 13 U/L (ref 0–37)
Alkaline Phosphatase: 64 U/L (ref 39–117)
CO2: 26 mEq/L (ref 19–32)
Calcium: 10.1 mg/dL (ref 8.4–10.5)
GFR calc Af Amer: >90 mL/min (ref >90)
Glucose, Bld: 95 mg/dL (ref 70–99)
Potassium: 4.1 mEq/L (ref 3.5–5.1)
Sodium: 143 mEq/L (ref 135–145)
Total Protein: 7.5 g/dL (ref 6.0–8.3)

## 2013-04-25 LAB — CBC WITH DIFFERENTIAL/PLATELET
Basophils Absolute: 0 10*3/uL (ref 0.0–0.1)
Eosinophils Absolute: 0.1 10*3/uL (ref 0.0–0.7)
Eosinophils Relative: 1 % (ref 0–5)
Lymphocytes Relative: 14 % (ref 12–46)
Lymphs Abs: 1.2 10*3/uL (ref 0.7–4.0)
Neutrophils Relative %: 76 % (ref 43–77)
Platelets: 275 10*3/uL (ref 150–400)
RBC: 5.06 MIL/uL (ref 4.22–5.81)
RDW: 12.5 % (ref 11.5–15.5)
WBC: 8.6 10*3/uL (ref 4.0–10.5)

## 2013-04-25 MED ORDER — KETOROLAC TROMETHAMINE 30 MG/ML IJ SOLN
30.0000 mg | Freq: Once | INTRAMUSCULAR | Status: AC
  Administered 2013-04-25: 30 mg via INTRAVENOUS
  Filled 2013-04-25: qty 1

## 2013-04-25 MED ORDER — METOCLOPRAMIDE HCL 5 MG/ML IJ SOLN
10.0000 mg | Freq: Once | INTRAMUSCULAR | Status: AC
  Administered 2013-04-25: 10 mg via INTRAVENOUS
  Filled 2013-04-25: qty 2

## 2013-04-25 MED ORDER — LORAZEPAM 0.5 MG PO TABS: 0.5000 mg | ORAL_TABLET | Freq: Three times a day (TID) | ORAL | Status: DC | PRN

## 2013-04-25 MED ORDER — LORAZEPAM 2 MG/ML IJ SOLN: 0.5000 mg | Freq: Once | INTRAMUSCULAR | Status: DC

## 2013-04-25 MED ORDER — DIPHENHYDRAMINE HCL 50 MG/ML IJ SOLN
25.0000 mg | Freq: Once | INTRAMUSCULAR | Status: AC
  Administered 2013-04-25: 25 mg via INTRAVENOUS
  Filled 2013-04-25: qty 1

## 2013-04-25 NOTE — ED Notes (Signed)
Pt states that his headache has been unrelieved for past 2 days, pain is a throbbing pain in both temples, pt has tried Tylenol and Aleve with no pain decrease. States pain is 7/10, denies nausea and vomiting, does complain of nasal congestion.

## 2013-04-25 NOTE — ED Notes (Signed)
Complain of headache and nasal congestion

## 2013-04-25 NOTE — ED Notes (Signed)
Pt alert & oriented x4, stable gait. Patient given discharge instructions, paperwork & prescription(s). Patient  instructed to stop at the registration desk to finish any additional paperwork. Patient verbalized understanding. Pt left department w/ no further questions. 

## 2013-04-25 NOTE — ED Provider Notes (Signed)
CSN: [id number]     Arrival date & time 04/25/13  4098 History  This chart was scribed for Alan Lennert, MD,  by Alan Hess, ED Scribe. The patient was seen in room APA01/APA01 and the patient's care was started at  7:08 AM   First MD Initiated Contact with Patient 04/25/13 0706     Chief Complaint  Patient presents with  . Headache   (Consider location/radiation/quality/duration/timing/severity/associated sxs/prior Treatment) Patient is a 22 y.o. male presenting with headaches. The history is provided by the patient and medical records. No language interpreter was used.  Headache Pain location:  Generalized Onset quality:  Sudden Duration:  2 days Timing:  Constant Progression:  Unchanged Chronicity:  New Similar to prior headaches: no   Relieved by:  Nothing Associated symptoms: congestion (nasal)    HPI Comments: Alan Hess is a 22 y.o. male who presents to the Emergency Department complaining of constant, severe, headache for the past two days. Pt reports the pain disturbs his sleep and mentions this is "taking a toll" on him.  Pt works 12 hours shifts for four consecutive days. He denies prior headaches. He also complains of nasal congestion as an associated symptoms. Pt has a hx of seasonal allergies. Pt smokes tobacco and does not drink alcohol.  Past Medical History  Diagnosis Date  . Seasonal allergies   . ADD (attention deficit disorder)    No past surgical history on file. No family history on file. History  Substance Use Topics  . Smoking status: Never Smoker   . Smokeless tobacco: Current User    Types: Chew  . Alcohol Use: No    Review of Systems  HENT: Positive for congestion (nasal).   Neurological: Positive for headaches.  All other systems reviewed and are negative.    Allergies  Iohexol  Home Medications   Current Outpatient Rx  Name  Route  Sig  Dispense  Refill  . cetirizine (ZYRTEC) 10 MG tablet   Oral   Take 10 mg by  mouth daily.          BP 121/61  Pulse 82  Temp(Src) 98.1 F (36.7 C) (Oral)  Resp 18  Ht 5\' 9"  (1.753 m)  Wt 230 lb (104.327 kg)  BMI 33.95 kg/m2  SpO2 99% Physical Exam  Nursing note and vitals reviewed. Constitutional: He is oriented to person, place, and time. He appears well-developed and well-nourished.  HENT:  Head: Normocephalic.  Eyes: Conjunctivae and EOM are normal. No scleral icterus.  Neck: Neck supple. No thyromegaly present.  Cardiovascular: Normal rate and regular rhythm.  Exam reveals no gallop and no friction rub.   No murmur heard. Pulmonary/Chest: No stridor. He has no wheezes. He has no rales. He exhibits no tenderness.  Abdominal: He exhibits no distension. There is no tenderness. There is no rebound.  Musculoskeletal: Normal range of motion. He exhibits no edema.  Lymphadenopathy:    He has no cervical adenopathy.  Neurological: He is oriented to person, place, and time. He exhibits normal muscle tone. Coordination normal.  Skin: No rash noted. No erythema.  Psychiatric: He has a normal mood and affect. His behavior is normal.    ED Course  Procedures (including critical care time) DIAGNOSTIC STUDIES: Oxygen Saturation is 99% on room air, normal by my interpretation.    COORDINATION OF CARE: 7:12 AM Discussed course of care with pt which includes Toradol, Reglan, Benadryl, Head CT and laboratory tests. Pt understands and agrees.  Labs Review  Labs Reviewed  COMPREHENSIVE METABOLIC PANEL - Abnormal; Notable for the following:    Total Bilirubin 1.5 (*)    All other components within normal limits  CBC WITH DIFFERENTIAL   Imaging Review Ct Head Wo Contrast  04/25/2013   CLINICAL DATA:  Headache.  EXAM: CT HEAD WITHOUT CONTRAST  TECHNIQUE: Contiguous axial images were obtained from the base of the skull through the vertex without intravenous contrast.  COMPARISON:  None.  FINDINGS: Bony calvarium appears intact. No mass effect or midline shift is  noted. Ventricular size is within normal limits. There is no evidence of mass lesion, hemorrhage or acute infarction.  IMPRESSION: No gross intracranial abnormality seen.   Electronically Signed   By: Roque Lias M.D.   On: 04/25/2013 07:59    EKG Interpretation   None       MDM  No diagnosis found.  The chart was scribed for me under my direct supervision.  I personally performed the history, physical, and medical decision making and all procedures in the evaluation of this patient.Alan Lennert, MD 04/25/13 [phone]

## 2013-04-25 NOTE — ED Notes (Signed)
Pt states the medicine is making him jittery but the headache is gone, does not want any more medications

## 2013-05-31 ENCOUNTER — Emergency Department (HOSPITAL_COMMUNITY): Payer: Self-pay

## 2013-05-31 ENCOUNTER — Encounter (HOSPITAL_COMMUNITY): Payer: Self-pay | Admitting: Emergency Medicine

## 2013-05-31 ENCOUNTER — Emergency Department (HOSPITAL_COMMUNITY)
Admission: EM | Admit: 2013-05-31 | Discharge: 2013-05-31 | Disposition: A | Discharge status: Home / Self Care | Payer: Self-pay | Attending: Emergency Medicine | Admitting: Emergency Medicine

## 2013-05-31 DIAGNOSIS — S40019A Contusion of unspecified shoulder, initial encounter: Secondary | ICD-10-CM | POA: Insufficient documentation

## 2013-05-31 DIAGNOSIS — Z8659 Personal history of other mental and behavioral disorders: Secondary | ICD-10-CM | POA: Insufficient documentation

## 2013-05-31 DIAGNOSIS — S40012A Contusion of left shoulder, initial encounter: Secondary | ICD-10-CM

## 2013-05-31 DIAGNOSIS — Z8709 Personal history of other diseases of the respiratory system: Secondary | ICD-10-CM | POA: Insufficient documentation

## 2013-05-31 DIAGNOSIS — T148XXA Other injury of unspecified body region, initial encounter: Secondary | ICD-10-CM

## 2013-05-31 DIAGNOSIS — W208XXA Other cause of strike by thrown, projected or falling object, initial encounter: Secondary | ICD-10-CM | POA: Insufficient documentation

## 2013-05-31 DIAGNOSIS — IMO0002 Reserved for concepts with insufficient information to code with codable children: Secondary | ICD-10-CM | POA: Insufficient documentation

## 2013-05-31 DIAGNOSIS — S0993XA Unspecified injury of face, initial encounter: Secondary | ICD-10-CM | POA: Insufficient documentation

## 2013-05-31 DIAGNOSIS — Y929 Unspecified place or not applicable: Secondary | ICD-10-CM | POA: Insufficient documentation

## 2013-05-31 DIAGNOSIS — Y9389 Activity, other specified: Secondary | ICD-10-CM | POA: Insufficient documentation

## 2013-05-31 DIAGNOSIS — Z79899 Other long term (current) drug therapy: Secondary | ICD-10-CM | POA: Insufficient documentation

## 2013-05-31 MED ORDER — IBUPROFEN 800 MG PO TABS
800.0000 mg | ORAL_TABLET | Freq: Once | ORAL | Status: AC
  Administered 2013-05-31: 800 mg via ORAL
  Filled 2013-05-31: qty 1

## 2013-05-31 MED ORDER — TRAMADOL HCL 50 MG PO TABS: 50.0000 mg | ORAL_TABLET | Freq: Four times a day (QID) | ORAL | Status: DC | PRN

## 2013-05-31 MED ORDER — CYCLOBENZAPRINE HCL 10 MG PO TABS
10.0000 mg | ORAL_TABLET | Freq: Once | ORAL | Status: AC
  Administered 2013-05-31: 10 mg via ORAL
  Filled 2013-05-31: qty 1

## 2013-05-31 MED ORDER — CYCLOBENZAPRINE HCL 5 MG PO TABS: 5.0000 mg | ORAL_TABLET | Freq: Three times a day (TID) | ORAL | Status: DC | PRN

## 2013-05-31 MED ORDER — IBUPROFEN 600 MG PO TABS: 600.0000 mg | ORAL_TABLET | Freq: Four times a day (QID) | ORAL | Status: DC | PRN

## 2013-05-31 MED ORDER — HYDROCODONE-ACETAMINOPHEN 5-325 MG PO TABS: 1.0000 | ORAL_TABLET | Freq: Once | ORAL | Status: DC

## 2013-05-31 NOTE — ED Notes (Signed)
No bruising noted to the pt's shoulder.
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
## 2013-06-03 NOTE — ED Provider Notes (Signed)
Medical screening examination/treatment/procedure(s) were performed by non-physician practitioner and as supervising physician I was immediately available for consultation/collaboration.  EKG Interpretation   None         Elsworth Ledin L Aziz Slape, MD 06/03/13 1454 

## 2013-06-24 ENCOUNTER — Encounter (HOSPITAL_COMMUNITY): Payer: Self-pay | Admitting: Emergency Medicine

## 2013-06-24 ENCOUNTER — Emergency Department (HOSPITAL_COMMUNITY)
Admission: EM | Admit: 2013-06-24 | Discharge: 2013-06-24 | Discharge status: Against Medical Advice | Payer: Self-pay | Attending: Emergency Medicine | Admitting: Emergency Medicine

## 2013-06-24 DIAGNOSIS — Z760 Encounter for issue of repeat prescription: Secondary | ICD-10-CM | POA: Insufficient documentation

## 2013-06-24 NOTE — ED Notes (Signed)
Pt states he has been on and off his adderol since July. States he sees a [facility]. Denies si/hi. States he "just feels weird".

## 2013-08-17 ENCOUNTER — Encounter (HOSPITAL_COMMUNITY): Payer: Self-pay | Admitting: Emergency Medicine

## 2013-08-17 ENCOUNTER — Emergency Department (HOSPITAL_COMMUNITY)
Admission: EM | Admit: 2013-08-17 | Discharge: 2013-08-17 | Disposition: A | Discharge status: Home / Self Care | Payer: Self-pay | Attending: Emergency Medicine | Admitting: Emergency Medicine

## 2013-08-17 DIAGNOSIS — F1210 Cannabis abuse, uncomplicated: Secondary | ICD-10-CM | POA: Insufficient documentation

## 2013-08-17 DIAGNOSIS — Z8709 Personal history of other diseases of the respiratory system: Secondary | ICD-10-CM | POA: Insufficient documentation

## 2013-08-17 DIAGNOSIS — Z79899 Other long term (current) drug therapy: Secondary | ICD-10-CM | POA: Insufficient documentation

## 2013-08-17 DIAGNOSIS — F1510 Other stimulant abuse, uncomplicated: Secondary | ICD-10-CM | POA: Insufficient documentation

## 2013-08-17 DIAGNOSIS — F1310 Sedative, hypnotic or anxiolytic abuse, uncomplicated: Secondary | ICD-10-CM | POA: Insufficient documentation

## 2013-08-17 DIAGNOSIS — F909 Attention-deficit hyperactivity disorder, unspecified type: Secondary | ICD-10-CM

## 2013-08-17 DIAGNOSIS — F988 Other specified behavioral and emotional disorders with onset usually occurring in childhood and adolescence: Secondary | ICD-10-CM

## 2013-08-17 DIAGNOSIS — R45851 Suicidal ideations: Secondary | ICD-10-CM

## 2013-08-17 LAB — RAPID URINE DRUG SCREEN, HOSP PERFORMED
AMPHETAMINES: POSITIVE — AB
Barbiturates: NOT DETECTED
Benzodiazepines: POSITIVE — AB
Cocaine: NOT DETECTED
Opiates: NOT DETECTED
TETRAHYDROCANNABINOL: POSITIVE — AB

## 2013-08-17 LAB — CBC WITH DIFFERENTIAL/PLATELET
Basophils Absolute: 0 10*3/uL (ref 0.0–0.1)
Basophils Relative: 1 % (ref 0–1)
Eosinophils Absolute: 0.1 10*3/uL (ref 0.0–0.7)
Eosinophils Relative: 2 % (ref 0–5)
HCT: 43.5 % (ref 39.0–52.0)
Hemoglobin: 14.9 g/dL (ref 13.0–17.0)
Lymphocytes Relative: 27 % (ref 12–46)
Lymphs Abs: 1.6 10*3/uL (ref 0.7–4.0)
MCH: 31.4 pg (ref 26.0–34.0)
MCHC: 34.3 g/dL (ref 30.0–36.0)
MCV: 91.6 fL (ref 78.0–100.0)
Monocytes Absolute: 0.6 10*3/uL (ref 0.1–1.0)
Monocytes Relative: 11 % (ref 3–12)
Neutro Abs: 3.6 10*3/uL (ref 1.7–7.7)
Neutrophils Relative %: 60 % (ref 43–77)
Platelets: 288 10*3/uL (ref 150–400)
RBC: 4.75 MIL/uL (ref 4.22–5.81)
RDW: 12.8 % (ref 11.5–15.5)
WBC: 6 10*3/uL (ref 4.0–10.5)

## 2013-08-17 LAB — BASIC METABOLIC PANEL
BUN: 9 mg/dL (ref 6–23)
CHLORIDE: 108 meq/L (ref 96–112)
CO2: 26 mEq/L (ref 19–32)
Calcium: 9.5 mg/dL (ref 8.4–10.5)
Creatinine, Ser: 0.9 mg/dL (ref 0.50–1.35)
GFR calc non Af Amer: >90 mL/min (ref >90)
GLUCOSE: 101 mg/dL — AB (ref 70–99)
POTASSIUM: 3.9 meq/L (ref 3.7–5.3)
Sodium: 143 mEq/L (ref 137–147)

## 2013-08-17 LAB — ETHANOL: Alcohol, Ethyl (B): <11 mg/dL (ref 0–11)

## 2013-08-17 MED ORDER — AMPHETAMINE-DEXTROAMPHETAMINE 20 MG PO TABS: 20.0000 mg | ORAL_TABLET | Freq: Two times a day (BID) | ORAL | Status: DC

## 2013-08-17 MED ORDER — IBUPROFEN 400 MG PO TABS
600.0000 mg | ORAL_TABLET | Freq: Once | ORAL | Status: DC
Start: 2013-08-17 — End: 2013-08-17

## 2013-08-17 MED ORDER — ONDANSETRON HCL 4 MG PO TABS: 4.0000 mg | ORAL_TABLET | Freq: Three times a day (TID) | ORAL | Status: DC | PRN

## 2013-08-17 MED ORDER — LORAZEPAM 1 MG PO TABS
2.0000 mg | ORAL_TABLET | Freq: Once | ORAL | Status: AC
Start: 2013-08-17 — End: 2013-08-17
  Administered 2013-08-17: 2 mg via ORAL
  Filled 2013-08-17: qty 2

## 2013-08-17 MED ORDER — LORAZEPAM 1 MG PO TABS
1.0000 mg | ORAL_TABLET | Freq: Once | ORAL | Status: AC
  Administered 2013-08-17: 1 mg via ORAL
  Filled 2013-08-17: qty 1

## 2013-08-17 MED ORDER — AMPHETAMINE-DEXTROAMPHETAMINE 10 MG PO TABS
20.0000 mg | ORAL_TABLET | Freq: Every day | ORAL | Status: AC
  Administered 2013-08-17: 20 mg via ORAL
  Filled 2013-08-17: qty 2

## 2013-08-17 MED ORDER — LORAZEPAM 1 MG PO TABS
1.0000 mg | ORAL_TABLET | Freq: Three times a day (TID) | ORAL | Status: DC | PRN
  Administered 2013-08-17: 1 mg via ORAL
  Filled 2013-08-17: qty 1

## 2013-08-17 MED ORDER — ACETAMINOPHEN 325 MG PO TABS: 650.0000 mg | ORAL_TABLET | ORAL | Status: DC | PRN

## 2013-08-17 MED ORDER — ALUM & MAG HYDROXIDE-SIMETH 200-200-20 MG/5ML PO SUSP: 30.0000 mL | ORAL | Status: DC | PRN

## 2013-08-17 NOTE — ED Notes (Signed)
Pt being uncooperative-refusing to change into paper scrubs and give belongings to nurse. Pt states he is "going to go off".security at bedside. EDP aware IVC papers filled out.

## 2013-08-17 NOTE — ED Provider Notes (Signed)
CSN: [id number]     Arrival date & time 08/17/13  7829 History   First MD Initiated Contact with Patient 08/17/13 [phone]     Chief Complaint  Patient presents with  . V70.1     (Consider location/radiation/quality/duration/timing/severity/associated sxs/prior Treatment) HPI This is a 23 year old male with a long-standing history of ADD and depression. He has been off his medications for "a long time". He is subsequently become depressed over the past several months. The depression worsened recently due to stressors in his family. He has become suicidal with plans that include pain himself. He is here voluntarily requesting that he be placed back on his psychiatric medications so that he can suppress these suicidal thoughts. He is having some mild abdominal upset otherwise denies pain or other acute somatic symptom. He did breakout in hives earlier this morning due to an emotional upset. These are resolving. As a consequence of his depression he has been having difficulty sleeping, difficulty concentrating and has been obtaining Adderall on the street.   Past Medical History  Diagnosis Date  . Seasonal allergies   . ADD (attention deficit disorder)    History reviewed. No pertinent past surgical history. No family history on file. History  Substance Use Topics  . Smoking status: Never Smoker   . Smokeless tobacco: Current User    Types: Chew  . Alcohol Use: No    Review of Systems  All other systems reviewed and are negative.      Allergies  Iohexol  Home Medications   Current Outpatient Rx  Name  Route  Sig  Dispense  Refill  . acetaminophen (TYLENOL) 500 MG tablet   Oral   Take 500 mg by mouth every 6 (six) hours as needed.         . cetirizine (ZYRTEC) 10 MG tablet   Oral   Take 10 mg by mouth daily.         . cyclobenzaprine (FLEXERIL) 5 MG tablet   Oral   Take 1 tablet (5 mg total) by mouth 3 (three) times daily as needed for muscle spasms.   15 tablet    0   . ibuprofen (ADVIL,MOTRIN) 200 MG tablet   Oral   Take 200 mg by mouth every 6 (six) hours as needed.         Marland Kitchen ibuprofen (ADVIL,MOTRIN) 600 MG tablet   Oral   Take 1 tablet (600 mg total) by mouth every 6 (six) hours as needed.   30 tablet   0    BP 129/78  Pulse 101  Temp(Src) 97.7 F (36.5 C) (Oral)  Resp 20  SpO2 100%  Physical Exam General: Well-developed, thin male in no acute distress; appearance consistent with age of record HENT: normocephalic; atraumatic Eyes: pupils equal, round and reactive to light; extraocular muscles intact Neck: supple Heart: regular rate and rhythm Lungs: clear to auscultation bilaterally Abdomen: soft; nondistended; nontender; no masses or hepatosplenomegaly; bowel sounds present Extremities: No deformity; full range of motion; pulses normal Neurologic: Awake, alert and oriented; motor function intact in all extremities and symmetric; no facial droop Skin: Warm and dry; resolving urticaria shoulders and upper chest Psychiatric: Depressed mood with congruent affect; suicidal ideation, denies homicidal ideation    ED Course  Procedures (including critical care time)   MDM   Nursing notes and vitals signs, including pulse oximetry, reviewed.  Summary of this visit's results, reviewed by myself:  Labs:  Results for orders placed during the hospital encounter of 08/17/13 (  from the past 24 hour(s))  URINE RAPID DRUG SCREEN (HOSP PERFORMED)     Status: Abnormal   Collection Time    08/17/13  5:54 AM      Result Value Ref Range   Opiates NONE DETECTED  NONE DETECTED   Cocaine NONE DETECTED  NONE DETECTED   Benzodiazepines POSITIVE (*) NONE DETECTED   Amphetamines POSITIVE (*) NONE DETECTED   Tetrahydrocannabinol POSITIVE (*) NONE DETECTED   Barbiturates NONE DETECTED  NONE DETECTED  CBC WITH DIFFERENTIAL     Status: None   Collection Time    08/17/13  6:05 AM      Result Value Ref Range   WBC 6.0  4.0 - 10.5 K/uL   RBC  4.75  4.22 - 5.81 MIL/uL   Hemoglobin 14.9  13.0 - 17.0 g/dL   HCT 69.643.5  29.539.0 - 28.452.0 %   MCV 91.6  78.0 - 100.0 fL   MCH 31.4  26.0 - 34.0 pg   MCHC 34.3  30.0 - 36.0 g/dL   RDW 13.212.8  44.011.5 - 10.215.5 %   Platelets 288  150 - 400 K/uL   Neutrophils Relative % 60  43 - 77 %   Neutro Abs 3.6  1.7 - 7.7 K/uL   Lymphocytes Relative 27  12 - 46 %   Lymphs Abs 1.6  0.7 - 4.0 K/uL   Monocytes Relative 11  3 - 12 %   Monocytes Absolute 0.6  0.1 - 1.0 K/uL   Eosinophils Relative 2  0 - 5 %   Eosinophils Absolute 0.1  0.0 - 0.7 K/uL   Basophils Relative 1  0 - 1 %   Basophils Absolute 0.0  0.0 - 0.1 K/uL  BASIC METABOLIC PANEL     Status: Abnormal   Collection Time    08/17/13  6:05 AM      Result Value Ref Range   Sodium 143  137 - 147 mEq/L   Potassium 3.9  3.7 - 5.3 mEq/L   Chloride 108  96 - 112 mEq/L   CO2 26  19 - 32 mEq/L   Glucose, Bld 101 (*) 70 - 99 mg/dL   BUN 9  6 - 23 mg/dL   Creatinine, Ser 7.250.90  0.50 - 1.35 mg/dL   Calcium 9.5  8.4 - 36.610.5 mg/dL   GFR calc non Af Amer >90  >90 mL/min   GFR calc Af Amer >90  >90 mL/min  ETHANOL     Status: None   Collection Time    08/17/13  6:05 AM      Result Value Ref Range   Alcohol, Ethyl (B) <11  0 - 11 mg/dL       Hanley SeamenJohn L Edilson Vital, MD 08/17/13 [id number]

## 2013-08-17 NOTE — ED Notes (Signed)
Pt st stay for 24 hour obs and get another telepsych in the morning.

## 2013-08-17 NOTE — Progress Notes (Signed)
Writer scheduled a Tele Assessment for 12:30pm.  Clinical research associateWriter informed the nurse Lennon Alstrom(JJ)

## 2013-08-17 NOTE — ED Notes (Addendum)
Pt reports he has been off his medication for the past 3 years. States "I've been trying to be strong. It's just gotten worse". Pt reports SI. When asked if he has a plan, replies "yes, whatever I can do". Pt brought in voluntarily by [facility].

## 2013-08-17 NOTE — ED Provider Notes (Signed)
Patient is not psychotic. No suicidal or homicidal ideation. Refill Adderall 20 mg twice a day. Patient has primary care followup.  Donnetta HutchingBrian Xee [facility], MD 08/17/13 [phone]

## 2013-08-17 NOTE — ED Notes (Signed)
Pt calmer .

## 2013-08-17 NOTE — ED Notes (Signed)
Pt increasingly agitated-tearful and cursing. EDP at bedside.

## 2013-08-17 NOTE — ED Notes (Signed)
Pt wanting to argue with me about the amount of ativan he can take.  States I gave him 3 ativan last time.  (when he actually got 2 ativan)   Wanting to argue about only getting 1 ativan this time.

## 2013-08-17 NOTE — ED Notes (Signed)
Pt states he is feeling  Very mad and angry.  States he doesn't want to flip out and leave.  Spoke with dr. Bebe ShaggyWickline.  Orders received.

## 2013-08-17 NOTE — Discharge Instructions (Signed)
Refill for Adderall given. Followup your primary care Dr.

## 2013-08-17 NOTE — ED Notes (Signed)
Pt reports that his family is stressing him out and making "it worse".

## 2013-08-17 NOTE — Consult Note (Signed)
Telepsych Consultation   Reason for Consult:  Suicidal Ideation (possible) Referring Physician:  EDP Vallarie Hess is an 23 y.o. male.  Assessment: AXIS I:  ADHD, combined type AXIS II:  Deferred AXIS III:   Past Medical History  Diagnosis Date  . Seasonal allergies   . ADD (attention deficit disorder)    AXIS IV:  other psychosocial or environmental problems and problems related to social environment AXIS V:  51-60 moderate symptoms  Plan:  Hold for 24-hour observation. Pt denies SI, HI, AVH at this time. Telepsych tomorrow AM, if pt stable, plan is to discharge home with outpatient psychiatry referral.  Subjective:   Alan Hess is a 23 y.o. male patient presenting to the [facility] with complaints of "wanting to hang himself", escorted voluntarily by [place]. Reviewing the pt's historical encounters as well as this one, requests for prescriptions for Adderall are scattered throughout his records, including pt being discharged as a patient by [facility] for positive drug screens during Adderall therapy as well as manipulative behavior such as adding on requests for Adderall prescriptions at the end of other family members' appointments which he is present at. During assessment, pt denies SI, HI, and AVH, contracts for safety. Pt reports that he "only said the hanging comment to come to the ED to get Adderall...didn't really want to commit suicide". Pt is calm, oriented x 4, cooperative during the assessment, and in NAD. However, pt becomes very anxious when informed that he will not be given a prescription for Adderall in the ED.   HPI:  This is a 23 year old male with a long-standing history of ADD and depression. He has been off his medications for "a long time". He is subsequently become depressed over the past several months. The depression worsened recently due to stressors in his family. He has become suicidal with plans that include pain himself. He is here  voluntarily requesting that he be placed back on his psychiatric medications so that he can suppress these suicidal thoughts. He is having some mild abdominal upset otherwise denies pain or other acute somatic symptom. He did breakout in hives earlier this morning due to an emotional upset. These are resolving. As a consequence of his depression he has been having difficulty sleeping, difficulty concentrating and has been obtaining Adderall on the street.    HPI Elements:   Location:  Generalized, Inpatient [facility]. Quality:  Stable. Severity:  Moderate. Timing:  Transient. Duration:  Chronic. Context:  Pt is manipulative, attempting to obtain Adderall scripts from multiple sources and obtaining them "on the street" when he cannot do so legally..  Past Psychiatric History: Past Medical History  Diagnosis Date  . Seasonal allergies   . ADD (attention deficit disorder)     reports that he has never smoked. His smokeless tobacco use includes Chew. He reports that he does not drink alcohol or use illicit drugs. History reviewed. No pertinent family history.       Allergies:   Allergies  Allergen Reactions  . Iohexol      Code: HIVES, Desc: became itchy after injection, Onset Date: [id number]     ACT Assessment Complete:  Yes:    Educational Status    Risk to Self: Risk to self Substance abuse history and/or treatment for substance abuse?: No  Risk to Others:    Abuse:    Prior Inpatient Therapy:    Prior Outpatient Therapy:    Additional Information:      Objective: Blood  pressure 117/64, pulse 69, temperature 98.4 F (36.9 C), temperature source Oral, resp. rate 18, SpO2 99.00%.There is no weight on file to calculate BMI. Results for orders placed during the hospital encounter of 08/17/13 (from the past 72 hour(s))  URINE RAPID DRUG SCREEN (HOSP PERFORMED)     Status: Abnormal   Collection Time    08/17/13  5:54 AM      Result Value Ref Range   Opiates NONE DETECTED  NONE  DETECTED   Cocaine NONE DETECTED  NONE DETECTED   Benzodiazepines POSITIVE (*) NONE DETECTED   Amphetamines POSITIVE (*) NONE DETECTED   Tetrahydrocannabinol POSITIVE (*) NONE DETECTED   Barbiturates NONE DETECTED  NONE DETECTED   Comment:            DRUG SCREEN FOR MEDICAL PURPOSES     ONLY.  IF CONFIRMATION IS NEEDED     FOR ANY PURPOSE, NOTIFY LAB     WITHIN 5 DAYS.                LOWEST DETECTABLE LIMITS     FOR URINE DRUG SCREEN     Drug Class       Cutoff (ng/mL)     Amphetamine      1000     Barbiturate      200     Benzodiazepine   537     Tricyclics       482     Opiates          300     Cocaine          300     THC              50  CBC WITH DIFFERENTIAL     Status: None   Collection Time    08/17/13  6:05 AM      Result Value Ref Range   WBC 6.0  4.0 - 10.5 K/uL   RBC 4.75  4.22 - 5.81 MIL/uL   Hemoglobin 14.9  13.0 - 17.0 g/dL   HCT 43.5  39.0 - 52.0 %   MCV 91.6  78.0 - 100.0 fL   MCH 31.4  26.0 - 34.0 pg   MCHC 34.3  30.0 - 36.0 g/dL   RDW 12.8  11.5 - 15.5 %   Platelets 288  150 - 400 K/uL   Neutrophils Relative % 60  43 - 77 %   Neutro Abs 3.6  1.7 - 7.7 K/uL   Lymphocytes Relative 27  12 - 46 %   Lymphs Abs 1.6  0.7 - 4.0 K/uL   Monocytes Relative 11  3 - 12 %   Monocytes Absolute 0.6  0.1 - 1.0 K/uL   Eosinophils Relative 2  0 - 5 %   Eosinophils Absolute 0.1  0.0 - 0.7 K/uL   Basophils Relative 1  0 - 1 %   Basophils Absolute 0.0  0.0 - 0.1 K/uL  BASIC METABOLIC PANEL     Status: Abnormal   Collection Time    08/17/13  6:05 AM      Result Value Ref Range   Sodium 143  137 - 147 mEq/L   Potassium 3.9  3.7 - 5.3 mEq/L   Chloride 108  96 - 112 mEq/L   CO2 26  19 - 32 mEq/L   Glucose, Bld 101 (*) 70 - 99 mg/dL   BUN 9  6 - 23 mg/dL   Creatinine, Ser 0.90  0.50 -  1.35 mg/dL   Calcium 9.5  8.4 - 10.5 mg/dL   GFR calc non Af Amer >90  >90 mL/min   GFR calc Af Amer >90  >90 mL/min   Comment: (NOTE)     The eGFR has been calculated using the CKD  EPI equation.     This calculation has not been validated in all clinical situations.     eGFR's persistently <90 mL/min signify possible Chronic Kidney     Disease.  ETHANOL     Status: None   Collection Time    08/17/13  6:05 AM      Result Value Ref Range   Alcohol, Ethyl (B) <11  0 - 11 mg/dL   Comment:            LOWEST DETECTABLE LIMIT FOR     SERUM ALCOHOL IS 11 mg/dL     FOR MEDICAL PURPOSES ONLY   Labs are reviewed and are pertinent for (+) Benzo, amphetamines, THC.  Current Facility-Administered Medications  Medication Dose Route Frequency Provider Last Rate Last Dose  . acetaminophen (TYLENOL) tablet 650 mg  650 mg Oral Q4H PRN John L Molpus, MD      . alum & mag hydroxide-simeth (MAALOX/MYLANTA) 200-200-20 MG/5ML suspension 30 mL  30 mL Oral PRN John L Molpus, MD      . LORazepam (ATIVAN) tablet 1 mg  1 mg Oral Q8H PRN Karen Chafe Molpus, MD   1 mg at 08/17/13 0903  . ondansetron (ZOFRAN) tablet 4 mg  4 mg Oral Q8H PRN Wynetta Fines, MD       Current Outpatient Prescriptions  Medication Sig Dispense Refill  . acetaminophen (TYLENOL) 500 MG tablet Take 500 mg by mouth every 8 (eight) hours as needed for moderate pain.       Marland Kitchen HYDROcodone-acetaminophen (NORCO/VICODIN) 5-325 MG per tablet Take 1-2 tablets by mouth every 8 (eight) hours as needed for moderate pain.        Psychiatric Specialty Exam:     Blood pressure 117/64, pulse 69, temperature 98.4 F (36.9 C), temperature source Oral, resp. rate 18, SpO2 99.00%.There is no weight on file to calculate BMI.  General Appearance: Casual  Eye Contact::  Good  Speech:  Clear and Coherent  Volume:  Normal  Mood:  Anxious  Affect:  Appropriate  Thought Process:  Coherent  Orientation:  Full (Time, Place, and Person)  Thought Content:  WDL  Suicidal Thoughts:  No  Homicidal Thoughts:  No  Memory:  Immediate;   Good Recent;   Good Remote;   Good  Judgement:  Fair  Insight:  Fair  Psychomotor Activity:  Normal   Concentration:  Good  Recall:  Good  Akathisia:  NA  Handed:    AIMS (if indicated):     Assets:  Desire for Improvement Resilience  Sleep:      Treatment Plan Summary: Hold for 24-hour observation. Pt denies SI, HI, AVH at this time. Telepsych tomorrow AM, if pt stable, plan is to discharge home with outpatient psychiatry referral.  Disposition: See above    Benjamine Mola, FNP-BC 08/17/2013 4:42 PM   Case discussed with physician extender and reviewed the information documented by physician extender and agree with the treatment plan.  Tavaughn Silguero,JANARDHAHA R. 08/18/2013 11:27 AM

## 2013-08-17 NOTE — ED Notes (Signed)
Per [facility]: [facility] was [facility] out due to pt "wanting to hang himself".

## 2013-08-26 ENCOUNTER — Emergency Department (HOSPITAL_COMMUNITY)
Admission: EM | Admit: 2013-08-26 | Discharge: 2013-08-26 | Disposition: A | Discharge status: Home / Self Care | Payer: Self-pay | Attending: Emergency Medicine | Admitting: Emergency Medicine

## 2013-08-26 ENCOUNTER — Encounter (HOSPITAL_COMMUNITY): Payer: Self-pay | Admitting: Emergency Medicine

## 2013-08-26 DIAGNOSIS — F988 Other specified behavioral and emotional disorders with onset usually occurring in childhood and adolescence: Secondary | ICD-10-CM | POA: Insufficient documentation

## 2013-08-26 DIAGNOSIS — R45851 Suicidal ideations: Secondary | ICD-10-CM | POA: Insufficient documentation

## 2013-08-26 DIAGNOSIS — Z79899 Other long term (current) drug therapy: Secondary | ICD-10-CM | POA: Insufficient documentation

## 2013-08-26 DIAGNOSIS — F32A Depression, unspecified: Secondary | ICD-10-CM

## 2013-08-26 DIAGNOSIS — F3289 Other specified depressive episodes: Secondary | ICD-10-CM | POA: Insufficient documentation

## 2013-08-26 DIAGNOSIS — F1310 Sedative, hypnotic or anxiolytic abuse, uncomplicated: Secondary | ICD-10-CM | POA: Insufficient documentation

## 2013-08-26 DIAGNOSIS — F329 Major depressive disorder, single episode, unspecified: Secondary | ICD-10-CM

## 2013-08-26 LAB — CBC WITH DIFFERENTIAL/PLATELET
BASOS ABS: 0 10*3/uL (ref 0.0–0.1)
BASOS PCT: 0 % (ref 0–1)
EOS PCT: 1 % (ref 0–5)
Eosinophils Absolute: 0.1 10*3/uL (ref 0.0–0.7)
HCT: 46.3 % (ref 39.0–52.0)
Hemoglobin: 15.7 g/dL (ref 13.0–17.0)
Lymphocytes Relative: 14 % (ref 12–46)
Lymphs Abs: 1.2 10*3/uL (ref 0.7–4.0)
MCH: 30.9 pg (ref 26.0–34.0)
MCHC: 33.9 g/dL (ref 30.0–36.0)
MCV: 91.1 fL (ref 78.0–100.0)
Monocytes Absolute: 0.4 10*3/uL (ref 0.1–1.0)
Monocytes Relative: 5 % (ref 3–12)
NEUTROS ABS: 6.9 10*3/uL (ref 1.7–7.7)
Neutrophils Relative %: 81 % — ABNORMAL HIGH (ref 43–77)
PLATELETS: 282 10*3/uL (ref 150–400)
RBC: 5.08 MIL/uL (ref 4.22–5.81)
RDW: 12.7 % (ref 11.5–15.5)
WBC: 8.6 10*3/uL (ref 4.0–10.5)

## 2013-08-26 LAB — RAPID URINE DRUG SCREEN, HOSP PERFORMED
Amphetamines: NOT DETECTED
Barbiturates: NOT DETECTED
Benzodiazepines: POSITIVE — AB
Cocaine: NOT DETECTED
Opiates: NOT DETECTED
Tetrahydrocannabinol: NOT DETECTED

## 2013-08-26 LAB — BASIC METABOLIC PANEL
BUN: 6 mg/dL (ref 6–23)
CALCIUM: 9.6 mg/dL (ref 8.4–10.5)
CO2: 31 meq/L (ref 19–32)
Chloride: 105 mEq/L (ref 96–112)
Creatinine, Ser: 0.85 mg/dL (ref 0.50–1.35)
GFR calc non Af Amer: >90 mL/min (ref >90)
Glucose, Bld: 97 mg/dL (ref 70–99)
Potassium: 4.6 mEq/L (ref 3.7–5.3)
SODIUM: 143 meq/L (ref 137–147)

## 2013-08-26 LAB — ETHANOL

## 2013-08-26 MED ORDER — AMPHETAMINE-DEXTROAMPHETAMINE 10 MG PO TABS
20.0000 mg | ORAL_TABLET | Freq: Every day | ORAL | Status: DC
  Administered 2013-08-26: 20 mg via ORAL
  Filled 2013-08-26: qty 2

## 2013-08-26 MED ORDER — LORAZEPAM 1 MG PO TABS
1.0000 mg | ORAL_TABLET | Freq: Once | ORAL | Status: AC
  Administered 2013-08-26: 1 mg via ORAL
  Filled 2013-08-26: qty 1

## 2013-08-26 MED ORDER — AMPHETAMINE-DEXTROAMPHETAMINE 20 MG PO TABS: 20.0000 mg | ORAL_TABLET | Freq: Two times a day (BID) | ORAL | Status: DC

## 2013-08-26 NOTE — BH Assessment (Signed)
Tele Assessment Note   ZALE MARCOTTE is an 23 y.o. male that presented to [facility] with vague suicidal thoughts. Sts he had thoughts this earlier this morning but no longer has them. His suicidal thoughts are related to his living situation. Says that he is homeless and has been for the past week. He was living with grandparents but after a argument with his grandfather was kicked out of the home. He appears remorseful stating, "I got very upset and pushed him". Patient eager to return back to grandparents home but afraid he will be rejected if he ask to come back. He is asking that this Clinical research associate or [facility] staff call his grandparents and ask if he can return back. Patient stating if his living situation is solved he will not have such passive suicidal thoughts. He also is requesting help with a job stating he was recently fired. Patient has no history of suicide attempts. Sts that he does have a hx of self mutilating by cutting and last incident was 1 yr ago. He denies HI and AVH's. He also denies current alcohol and drug use. Says that he did try THC 1x when he was younger. He has a Therapist, sports at [facility] but not happy with the services and reluctant to return. He is however requesting medications for his dx's ADD.   Writer discussed clinicals with Dr. Agapito Games before and following the completion of the assessment. Writer explained that patient is not suicidal, homicidal, and/or psychotic. He meets no criteria for inpatient psychiatric treatment. His issues are psychosocial related. Dr. Agapito Games will have LCSW follow up with patient and discharge home.   Axis I: Depressive Disorder Nos and ADD Axis III: Deferred   Past Medical History  Diagnosis Date  . Seasonal allergies   . ADD (attention deficit disorder)    Axis IV: other psychosocial or environmental problems, problems related to social environment, problems with access to health care services and problems with primary support group Axis V: 31-40  impairment in reality testing  Past Medical History:  Past Medical History  Diagnosis Date  . Seasonal allergies   . ADD (attention deficit disorder)     History reviewed. No pertinent past surgical history.  Family History: No family history on file.  Social History:  reports that he has never smoked. His smokeless tobacco use includes Chew. He reports that he does not drink alcohol or use illicit drugs.  Additional Social History:  Alcohol / Drug Use Pain Medications: SEE MAR Prescriptions: SEE MAR Over the Counter: SEE MAR History of alcohol / drug use?: No history of alcohol / drug abuse  CIWA: CIWA-Ar BP: 130/72 mmHg Pulse Rate: 98 COWS:    Allergies:  Allergies  Allergen Reactions  . Iohexol      Code: HIVES, Desc: became itchy after injection, Onset Date: [id number]     Home Medications:  (Not in a hospital admission)  OB/GYN Status:  No LMP for male patient.  General Assessment Data Location of Assessment: [facility] Is this a Tele or Face-to-Face Assessment?: Tele Assessment Is this an Initial Assessment or a Re-assessment for this encounter?: Initial Assessment Living Arrangements: Other (Comment) (homeless; kicked out of grandparents home 1 wk ago) Can pt return to current living arrangement?: No (kicked out of grandparents home ) Admission Status: Voluntary Is patient capable of signing voluntary admission?: Yes Transfer from: Acute Hospital Referral Source: Self/Family/Friend     [facility] Crisis Care Plan Living Arrangements: Other (Comment) (homeless; kicked out of grandparents  home 1 wk ago) Name of Psychiatrist:  [facility] ) Name of Therapist:  (None Reported )  Education Status Is patient currently in school?: No  Risk to self Suicidal Ideation: No-Not Currently/Within Last 6 Months Suicidal Intent: No Is patient at risk for suicide?: No Suicidal Plan?: No Access to Means: No What has been your use of drugs/alcohol within the last 12 months?:   (patient reports that he tried THC 1c in the past ) Previous Attempts/Gestures: No How many times?:  (none reported ) Other Self Harm Risks:  (hx of cutting self 1 yr ago) Triggers for Past Attempts: Other (Comment) (depression ) Intentional Self Injurious Behavior: None Family Suicide History: Unknown Recent stressful life event(s): Other (Comment) (argument w/ grandparents and kicked out of their home ) Persecutory voices/beliefs?: No Depression: Yes Depression Symptoms: Feeling angry/irritable;Feeling worthless/self pity;Loss of interest in usual pleasures;Guilt;Fatigue;Isolating;Tearfulness;Insomnia;Despondent Substance abuse history and/or treatment for substance abuse?: No Suicide prevention information given to non-admitted patients: Not applicable  Risk to Others Homicidal Ideation: No Thoughts of Harm to Others: No Current Homicidal Intent: No Current Homicidal Plan: No Access to Homicidal Means: No Identified Victim:  (patient is calm and cooperative ) History of harm to others?: No Assessment of Violence: None Noted Does patient have access to weapons?: No Criminal Charges Pending?: No Does patient have a court date: No  Psychosis Hallucinations: None noted Delusions: None noted  Mental Status Report Appear/Hygiene: Disheveled Eye Contact: Fair Motor Activity: Freedom of movement Speech: Logical/coherent Level of Consciousness: Alert Mood: Depressed Affect: Appropriate to circumstance Anxiety Level: Severe Thought Processes: Coherent Judgement: Unimpaired Orientation: Person;Situation;Time;Place Obsessive Compulsive Thoughts/Behaviors: None  Cognitive Functioning Concentration: Decreased Memory: Recent Intact;Remote Intact IQ: Average Insight: Fair Impulse Control: Good Appetite: Fair Weight Loss:  (none reported ) Weight Gain:  (none reported ) Sleep: Decreased Total Hours of Sleep:  (varies ) Vegetative Symptoms: None  ADLScreening [facility]  Assessment Services) Patient's cognitive ability adequate to safely complete daily activities?: Yes Patient able to express need for assistance with ADLs?: Yes Independently performs ADLs?: Yes (appropriate for developmental age)  Prior Inpatient Therapy Prior Inpatient Therapy: No Prior Therapy Dates:  (n/a) Prior Therapy Facilty/Provider(s):  (none reported ) Reason for Treatment:  (none reported )  Prior Outpatient Therapy Prior Outpatient Therapy: Yes Prior Therapy Dates:  ([facility]-current) Prior Therapy Facilty/Provider(s):  [facility] ) Reason for Treatment:  (depression )  ADL Screening (condition at time of admission) Patient's cognitive ability adequate to safely complete daily activities?: Yes Is the patient deaf or have difficulty hearing?: No Does the patient have difficulty seeing, even when wearing glasses/contacts?: No Does the patient have difficulty concentrating, remembering, or making decisions?: Yes Patient able to express need for assistance with ADLs?: Yes Does the patient have difficulty dressing or bathing?: No Independently performs ADLs?: Yes (appropriate for developmental age) Does the patient have difficulty walking or climbing stairs?: No Weakness of Legs: None Weakness of Arms/Hands: None  Home Assistive Devices/Equipment Home Assistive Devices/Equipment: None    Abuse/Neglect Assessment (Assessment to be complete while patient is alone) Physical Abuse: Denies Verbal Abuse: Denies Sexual Abuse: Denies Exploitation of patient/patient's resources: Denies Self-Neglect: Denies Values / Beliefs Cultural Requests During Hospitalization: None Spiritual Requests During Hospitalization: None   Advance Directives (For Healthcare) Advance Directive: Patient does not have advance directive Nutrition Screen- [facility] Adult/[facility] Patient's home diet: Regular  Additional Information 1:1 In Past 12 Months?: No CIRT Risk: No Elopement Risk: No Does  patient have medical clearance?: Yes  Disposition:  Disposition Initial Assessment Completed for this Encounter: Yes Disposition of Patient: [facility] Patient referred to: Other (Comment);Outpatient clinic referral [facility] )  Melynda Rippleerry, Winferd Wease [facility] 08/26/2013 5:22 PM

## 2013-08-26 NOTE — ED Notes (Signed)
Pt reports feeling depressed lately and having SI.  Pt denies any HI.  Pt reports his grandparents kicking him out of their house 2 weeks ago.  Pt reports staying at his "other grandparents" house off and on since then.  Pt reports due to his inability to get to work everyday that he may lose his job with the [employer] [address] [place].  Pt reports some withdrawal symptoms from Adderall because he ran out two days ago.  Pt reports being being on prescribed Adderall since he was 23 yrs old.  Pt reports pain to his back, and some anxiety.

## 2013-08-26 NOTE — ED Provider Notes (Signed)
CSN: [id number]     Arrival date & time 08/26/13  1052 History   First MD Initiated Contact with Patient 08/26/13 1147 This chart was scribed for Benny Lennert, MD by Valera Castle, ED Scribe. This patient was seen in room APA15/APA15 and the patient's care was started at 11:50 AM.     No chief complaint on file.  (Consider location/radiation/quality/duration/timing/severity/associated sxs/prior Treatment) The history is provided by the patient. No language interpreter was used.   HPI Comments: Alan Hess is a 23 y.o. male who presents to the Emergency Department complaining of depression and SI. He denies having a plan of suicide. He reports having been to psychiatric hospital when he was younger. He reports h/o ADD, but states he has been off of his medication recently. He no longer has a job, and does not have a place to sleep. He denies HI, and any other symptoms.  PCP - No PCP Per Patient  Past Medical History  Diagnosis Date  . Seasonal allergies   . ADD (attention deficit disorder)    History reviewed. No pertinent past surgical history. No family history on file. History  Substance Use Topics  . Smoking status: Never Smoker   . Smokeless tobacco: Current User    Types: Chew  . Alcohol Use: No    Review of Systems  Constitutional: Negative for appetite change and fatigue.  HENT: Negative for congestion, ear discharge and sinus pressure.   Eyes: Negative for discharge.  Respiratory: Negative for cough.   Cardiovascular: Negative for chest pain.  Gastrointestinal: Negative for abdominal pain and diarrhea.  Genitourinary: Negative for frequency and hematuria.  Musculoskeletal: Negative for back pain.  Skin: Negative for rash and wound.  Neurological: Negative for seizures and headaches.  Psychiatric/Behavioral: Positive for suicidal ideas and dysphoric mood. Negative for hallucinations and self-injury.       Negative for plan of suicide, HI.     Allergies   Iohexol  Home Medications   Current Outpatient Rx  Name  Route  Sig  Dispense  Refill  . amphetamine-dextroamphetamine (ADDERALL) 20 MG tablet   Oral   Take 1 tablet (20 mg total) by mouth 2 (two) times daily.   60 tablet   0    There were no vitals taken for this visit.  Physical Exam  Constitutional: He is oriented to person, place, and time. He appears well-developed.  HENT:  Head: Normocephalic.  Eyes: Conjunctivae and EOM are normal. No scleral icterus.  Neck: Neck supple. No thyromegaly present.  Cardiovascular: Normal rate and regular rhythm.  Exam reveals no gallop and no friction rub.   No murmur heard. Pulmonary/Chest: No stridor. He has no wheezes. He has no rales. He exhibits no tenderness.  Abdominal: He exhibits no distension. There is no tenderness. There is no rebound.  Musculoskeletal: Normal range of motion. He exhibits no edema.  Lymphadenopathy:    He has no cervical adenopathy.  Neurological: He is oriented to person, place, and time. He exhibits normal muscle tone. Coordination normal.  Skin: No rash noted. No erythema.  Psychiatric: His behavior is normal. He exhibits a depressed mood. He expresses suicidal ideation. He expresses no homicidal ideation. He expresses no suicidal plans.    ED Course  Procedures (including critical care time)  COORDINATION OF CARE: 11:53 AM-Discussed treatment plan which includes telepsych with pt at bedside and pt agreed to plan.   Results for orders placed during the hospital encounter of 08/26/13  URINE RAPID DRUG SCREEN (  HOSP PERFORMED)      Result Value Ref Range   Opiates NONE DETECTED  NONE DETECTED   Cocaine NONE DETECTED  NONE DETECTED   Benzodiazepines POSITIVE (*) NONE DETECTED   Amphetamines NONE DETECTED  NONE DETECTED   Tetrahydrocannabinol NONE DETECTED  NONE DETECTED   Barbiturates NONE DETECTED  NONE DETECTED  CBC WITH DIFFERENTIAL      Result Value Ref Range   WBC 8.6  4.0 - 10.5 K/uL   RBC  5.08  4.22 - 5.81 MIL/uL   Hemoglobin 15.7  13.0 - 17.0 g/dL   HCT 16.146.3  09.639.0 - 04.552.0 %   MCV 91.1  78.0 - 100.0 fL   MCH 30.9  26.0 - 34.0 pg   MCHC 33.9  30.0 - 36.0 g/dL   RDW 40.912.7  81.111.5 - 91.415.5 %   Platelets 282  150 - 400 K/uL   Neutrophils Relative % 81 (*) 43 - 77 %   Neutro Abs 6.9  1.7 - 7.7 K/uL   Lymphocytes Relative 14  12 - 46 %   Lymphs Abs 1.2  0.7 - 4.0 K/uL   Monocytes Relative 5  3 - 12 %   Monocytes Absolute 0.4  0.1 - 1.0 K/uL   Eosinophils Relative 1  0 - 5 %   Eosinophils Absolute 0.1  0.0 - 0.7 K/uL   Basophils Relative 0  0 - 1 %   Basophils Absolute 0.0  0.0 - 0.1 K/uL  BASIC METABOLIC PANEL      Result Value Ref Range   Sodium 143  137 - 147 mEq/L   Potassium 4.6  3.7 - 5.3 mEq/L   Chloride 105  96 - 112 mEq/L   CO2 31  19 - 32 mEq/L   Glucose, Bld 97  70 - 99 mg/dL   BUN 6  6 - 23 mg/dL   Creatinine, Ser 7.820.85  0.50 - 1.35 mg/dL   Calcium 9.6  8.4 - 95.610.5 mg/dL   GFR calc non Af Amer >90  >90 mL/min   GFR calc Af Amer >90  >90 mL/min  ETHANOL      Result Value Ref Range   Alcohol, Ethyl (B) <11  0 - 11 mg/dL   No results found.   EKG Interpretation None     Medications - No data to display MDM   Final diagnoses:  None    Pt depressed not suicidal and will follow up with pcp  The chart was scribed for me under my direct supervision.  I personally performed the history, physical, and medical decision making and all procedures in the evaluation of this patient.Benny Lennert.   Nickole Adamek L Arleth Mccullar, MD 08/26/13 [phone]

## 2013-08-26 NOTE — ED Notes (Signed)
Pt reports severe anxiety.  Notified edp

## 2013-08-26 NOTE — ED Notes (Signed)
Social work in with pt, gave him prescription voucher for his meds.  Grandmother has been contacted and on her way to pick up pt

## 2013-08-26 NOTE — ED Notes (Signed)
Gave pt lunch tray.  Pt ate 100%. nad noted

## 2013-09-21 ENCOUNTER — Other Ambulatory Visit: Payer: Self-pay | Admitting: Occupational Medicine

## 2013-09-21 ENCOUNTER — Ambulatory Visit: Payer: Self-pay

## 2013-09-21 DIAGNOSIS — M549 Dorsalgia, unspecified: Secondary | ICD-10-CM

## 2013-09-24 ENCOUNTER — Emergency Department (HOSPITAL_COMMUNITY)
Admission: EM | Admit: 2013-09-24 | Discharge: 2013-09-24 | Disposition: A | Discharge status: Home / Self Care | Payer: Self-pay | Attending: Emergency Medicine | Admitting: Emergency Medicine

## 2013-09-24 ENCOUNTER — Encounter (HOSPITAL_COMMUNITY): Payer: Self-pay | Admitting: Emergency Medicine

## 2013-09-24 DIAGNOSIS — F411 Generalized anxiety disorder: Secondary | ICD-10-CM | POA: Insufficient documentation

## 2013-09-24 DIAGNOSIS — Z79899 Other long term (current) drug therapy: Secondary | ICD-10-CM | POA: Insufficient documentation

## 2013-09-24 DIAGNOSIS — F988 Other specified behavioral and emotional disorders with onset usually occurring in childhood and adolescence: Secondary | ICD-10-CM | POA: Insufficient documentation

## 2013-09-24 DIAGNOSIS — Z760 Encounter for issue of repeat prescription: Secondary | ICD-10-CM | POA: Insufficient documentation

## 2013-09-24 MED ORDER — LORAZEPAM 1 MG PO TABS
1.0000 mg | ORAL_TABLET | Freq: Once | ORAL | Status: AC
  Administered 2013-09-24: 1 mg via ORAL
  Filled 2013-09-24: qty 1

## 2013-09-24 NOTE — ED Notes (Signed)
Pt requesting to speak with provider again, states he is not willing to leave until he receives a prescription for Adderal for which he has received here before.  Pt states if he doesn't get it, then "I will be right back up her saying i am suicidal again, and I really don't want to do that"   Alan Hess back to room to speak with pt at this time and to explain to pt as this nurse has attempted to do that we cannot prescribe ADHD meds that are prescribed by his Psychiatric doctors.  Pt states he cannot afford to go to his psychiatric doctor right now due to money issues.

## 2013-09-24 NOTE — ED Notes (Signed)
Pt states he tried to come off med, has not had in one month. He just can not take the way he is feeling in his stomach, trying to work and stay busy, has appt in 2 weeks, but feels he needs meds now

## 2013-09-24 NOTE — Discharge Instructions (Signed)
You have been treated tonight with Ativan. It is reported that you see your doctor, or physicians at behavioral health tomorrow for a refill of your ADHD prescriptions. The emergency department is not set up for prescription refills.

## 2013-09-24 NOTE — ED Notes (Signed)
Pt left very upset, "this is bullshit"  That he didn't get prescription for his adderall.  Pt did sign discharge acceptance.

## 2013-09-24 NOTE — ED Notes (Signed)
Patient states he has been out of his ADHD medication x 1 month and cannot go to doctor for another 2 weeks.
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
## 2013-09-26 NOTE — ED Provider Notes (Signed)
Medical screening examination/treatment/procedure(s) were performed by non-physician practitioner and as supervising physician I was immediately available for consultation/collaboration.   EKG Interpretation None        Benny LennertJoseph L Nasia Cannan, MD 09/26/13 1539

## 2013-10-03 ENCOUNTER — Ambulatory Visit (HOSPITAL_COMMUNITY)
Admission: RE | Admit: 2013-10-03 | Discharge: 2013-10-03 | Disposition: A | Discharge status: Home / Self Care | Payer: Self-pay | Attending: Psychiatry | Admitting: Psychiatry

## 2013-10-03 ENCOUNTER — Ambulatory Visit (INDEPENDENT_AMBULATORY_CARE_PROVIDER_SITE_OTHER): Payer: Self-pay | Admitting: Emergency Medicine

## 2013-10-03 VITALS — BP 122/68 | HR 74 | Temp 98.5°F | Resp 18 | Ht 69.0 in | Wt 134.0 lb

## 2013-10-03 DIAGNOSIS — Z765 Malingerer [conscious simulation]: Secondary | ICD-10-CM

## 2013-10-03 DIAGNOSIS — F1910 Other psychoactive substance abuse, uncomplicated: Secondary | ICD-10-CM
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
## 2013-10-07 ENCOUNTER — Telehealth: Payer: Self-pay | Admitting: *Deleted

## 2013-10-07 NOTE — Telephone Encounter (Signed)
Discussed with Frazier RichardsMary Beth Dixon and she will not accept back into the practice

## 2014-02-05 ENCOUNTER — Emergency Department (HOSPITAL_COMMUNITY)
Admission: EM | Admit: 2014-02-05 | Discharge: 2014-02-05 | Disposition: A | Discharge status: Home / Self Care | Payer: Self-pay | Attending: Emergency Medicine | Admitting: Emergency Medicine

## 2014-02-05 ENCOUNTER — Encounter (HOSPITAL_COMMUNITY): Payer: Self-pay | Admitting: Emergency Medicine

## 2014-02-05 DIAGNOSIS — Z79899 Other long term (current) drug therapy: Secondary | ICD-10-CM | POA: Insufficient documentation

## 2014-02-05 DIAGNOSIS — M545 Low back pain, unspecified: Secondary | ICD-10-CM | POA: Insufficient documentation

## 2014-02-05 DIAGNOSIS — Z8709 Personal history of other diseases of the respiratory system: Secondary | ICD-10-CM | POA: Insufficient documentation

## 2014-02-05 DIAGNOSIS — G478 Other sleep disorders: Secondary | ICD-10-CM | POA: Insufficient documentation

## 2014-02-05 DIAGNOSIS — F909 Attention-deficit hyperactivity disorder, unspecified type: Secondary | ICD-10-CM | POA: Insufficient documentation

## 2014-02-05 DIAGNOSIS — G4700 Insomnia, unspecified: Secondary | ICD-10-CM | POA: Insufficient documentation

## 2014-02-05 MED ORDER — HYDROXYZINE HCL 25 MG PO TABS: 25.0000 mg | ORAL_TABLET | Freq: Three times a day (TID) | ORAL | Status: DC | PRN

## 2014-02-05 MED ORDER — IBUPROFEN 800 MG PO TABS: 800.0000 mg | ORAL_TABLET | Freq: Three times a day (TID) | ORAL | Status: DC

## 2014-02-05 NOTE — ED Notes (Signed)
Pt reports taking various narcotic pain medications when he can get them to help alleviate the pain.

## 2014-02-05 NOTE — Discharge Instructions (Signed)
Back Pain, Adult °Low back pain is very common. About 1 in 5 people have back pain. The cause of low back pain is rarely dangerous. The pain often gets better over time. About half of people with a sudden onset of back pain feel better in just 2 weeks. About 8 in 10 people feel better by 6 weeks.  °CAUSES °Some common causes of back pain include: °· Strain of the muscles or ligaments supporting the spine. °· Wear and tear (degeneration) of the spinal discs. °· Arthritis. °· Direct injury to the back. °DIAGNOSIS °Most of the time, the direct cause of low back pain is not known. However, back pain can be treated effectively even when the exact cause of the pain is unknown. Answering your caregiver's questions about your overall health and symptoms is one of the most accurate ways to make sure the cause of your pain is not dangerous. If your caregiver needs more information, he or she may order lab work or imaging tests (X-rays or MRIs). However, even if imaging tests show changes in your back, this usually does not require surgery. °HOME CARE INSTRUCTIONS °For many people, back pain returns. Since low back pain is rarely dangerous, it is often a condition that people can learn to manage on their own.  °· Remain active. It is stressful on the back to sit or stand in one place. Do not sit, drive, or stand in one place for more than 30 minutes at a time. Take short walks on level surfaces as soon as pain allows. Try to increase the length of time you walk each day. °· Do not stay in bed. Resting more than 1 or 2 days can delay your recovery. °· Do not avoid exercise or work. Your body is made to move. It is not dangerous to be active, even though your back may hurt. Your back will likely heal faster if you return to being active before your pain is gone. °· Pay attention to your body when you  bend and lift. Many people have less discomfort when lifting if they bend their knees, keep the load close to their bodies, and  avoid twisting. Often, the most comfortable positions are those that put less stress on your recovering back. °· Find a comfortable position to sleep. Use a firm mattress and lie on your side with your knees slightly bent. If you lie on your back, put a pillow under your knees. °· Only take over-the-counter or prescription medicines as directed by your caregiver. Over-the-counter medicines to reduce pain and inflammation are often the most helpful. Your caregiver may prescribe muscle relaxant drugs. These medicines help dull your pain so you can more quickly return to your normal activities and healthy exercise. °· Put ice on the injured area. °¨ Put ice in a plastic bag. °¨ Place a towel between your skin and the bag. °¨ Leave the ice on for 15-20 minutes, 03-04 times a day for the first 2 to 3 days. After that, ice and heat may be alternated to reduce pain and spasms. °· Ask your caregiver about trying back exercises and gentle massage. This may be of some benefit. °· Avoid feeling anxious or stressed. Stress increases muscle tension and can worsen back pain. It is important to recognize when you are anxious or stressed and learn ways to manage it. Exercise is a great option. °SEEK MEDICAL CARE IF: °· You have pain that is not relieved with rest or medicine. °· You have pain that does not improve in 1 week. °· You have new symptoms. °· You are generally not feeling well. °SEEK   IMMEDIATE MEDICAL CARE IF:   You have pain that radiates from your back into your legs.  You develop new bowel or bladder control problems.  You have unusual weakness or numbness in your arms or legs.  You develop nausea or vomiting.  You develop abdominal pain.  You feel faint. Document Released: 05/21/2005 Document Revised: 11/20/2011 Document Reviewed: 09/22/2013 [facility] Patient Information 2015 [place], [place]. This information is not intended to replace advice given to you by your health care provider. Make sure you  discuss any questions you have with your health care provider.  Insomnia Insomnia is frequent trouble falling and/or staying asleep. Insomnia can be a long term problem or a short term problem. Both are common. Insomnia can be a short term problem when the wakefulness is related to a certain stress or worry. Long term insomnia is often related to ongoing stress during waking hours and/or poor sleeping habits. Overtime, sleep deprivation itself can make the problem worse. Every little thing feels more severe because you are overtired and your ability to cope is decreased. CAUSES   Stress, anxiety, and depression.  Poor sleeping habits.  Distractions such as TV in the bedroom.  Naps close to bedtime.  Engaging in emotionally charged conversations before bed.  Technical reading before sleep.  Alcohol and other sedatives. They may make the problem worse. They can hurt normal sleep patterns and normal dream activity.  Stimulants such as caffeine for several hours prior to bedtime.  Pain syndromes and shortness of breath can cause insomnia.  Exercise late at night.  Changing time zones may cause sleeping problems (jet lag). It is sometimes helpful to have someone observe your sleeping patterns. They should look for periods of not breathing during the night (sleep apnea). They should also look to see how long those periods last. If you live alone or observers are uncertain, you can also be observed at a sleep clinic where your sleep patterns will be professionally monitored. Sleep apnea requires a checkup and treatment. Give your caregivers your medical history. Give your caregivers observations your family has made about your sleep.  SYMPTOMS   Not feeling rested in the morning.  Anxiety and restlessness at bedtime.  Difficulty falling and staying asleep. TREATMENT   Your caregiver may prescribe treatment for an underlying medical disorders. Your caregiver can give advice or help if  you are using alcohol or other drugs for self-medication. Treatment of underlying problems will usually eliminate insomnia problems.  Medications can be prescribed for short time use. They are generally not recommended for lengthy use.  Over-the-counter sleep medicines are not recommended for lengthy use. They can be habit forming.  You can promote easier sleeping by making lifestyle changes such as:  Using relaxation techniques that help with breathing and reduce muscle tension.  Exercising earlier in the day.  Changing your diet and the time of your last meal. No night time snacks.  Establish a regular time to go to bed.  Counseling can help with stressful problems and worry.  Soothing music and white noise may be helpful if there are background noises you cannot remove.  Stop tedious detailed work at least one hour before bedtime. HOME CARE INSTRUCTIONS   Keep a diary. Inform your caregiver about your progress. This includes any medication side effects. See your caregiver regularly. Take note of:  Times when you are asleep.  Times when you are awake during the night.  The quality of your sleep.  How you feel the next  day. This information will help your caregiver care for you.  Get out of bed if you are still awake after 15 minutes. Read or do some quiet activity. Keep the lights down. Wait until you feel sleepy and go back to bed.  Keep regular sleeping and waking hours. Avoid naps.  Exercise regularly.  Avoid distractions at bedtime. Distractions include watching television or engaging in any intense or detailed activity like attempting to balance the household checkbook.  Develop a bedtime ritual. Keep a familiar routine of bathing, brushing your teeth, climbing into bed at the same time each night, listening to soothing music. Routines increase the success of falling to sleep faster.  Use relaxation techniques. This can be using breathing and muscle tension release  routines. It can also include visualizing peaceful scenes. You can also help control troubling or intruding thoughts by keeping your mind occupied with boring or repetitive thoughts like the old concept of counting sheep. You can make it more creative like imagining planting one beautiful flower after another in your backyard garden.  During your day, work to eliminate stress. When this is not possible use some of the previous suggestions to help reduce the anxiety that accompanies stressful situations. MAKE SURE YOU:   Understand these instructions.  Will watch your condition.  Will get help right away if you are not doing well or get worse. Document Released: 05/18/2000 Document Revised: 08/13/2011 Document Reviewed: 06/18/2007 [facility] Patient Information 2015 [place], [place]. This information is not intended to replace advice given to you by your health care provider. Make sure you discuss any questions you have with your health care provider.   Emergency Department Resource Guide 1) Find a Doctor and Pay Out of Pocket Although you won't have to find out who is covered by your insurance plan, it is a good idea to ask around and get recommendations. You will then need to call the office and see if the doctor you have chosen will accept you as a new patient and what types of options they offer for patients who are self-pay. Some doctors offer discounts or will set up payment plans for their patients who do not have insurance, but you will need to ask so you aren't surprised when you get to your appointment.  2) Contact Your Local Health Department Not all health departments have doctors that can see patients for sick visits, but many do, so it is worth a call to see if yours does. If you don't know where your local health department is, you can check in your phone book. The CDC also has a tool to help you locate your state's health department, and many state websites also have listings of all of  their local health departments.  3) Find a Walk-in Clinic If your illness is not likely to be very severe or complicated, you may want to try a walk in clinic. These are popping up all over the country in pharmacies, drugstores, and shopping centers. They're usually staffed by nurse practitioners or physician assistants that have been trained to treat common illnesses and complaints. They're usually fairly quick and inexpensive. However, if you have serious medical issues or chronic medical problems, these are probably not your best option.  No Primary Care Doctor: - Call Health Connect at  [phone] - they can help you locate a primary care doctor that  accepts your insurance, provides certain services, etc. - Physician Referral Service- [phone]  Chronic Pain Problems: Organization  Address  Phone   Notes  [facility]  [phone] Patients need to be referred by their primary care doctor.   Medication Assistance: Organization         Address  Phone   Notes  [facility] [address] [place], [place] [address] [phone] --Must be a resident of [facility] -- Must have NO insurance coverage whatsoever (no Medicaid/ Medicare, etc.) -- The pt. MUST have a primary care doctor that directs their care regularly and follows them in the community   [facility]ist  [phone]   [employer]  [phone]    Agencies that provide inexpensive medical care: Organization         Address  Phone   Notes  [facility]  [phone]   [facility]    [phone]   [facility] [address] [place], [place] [address] [phone]   [facility] [place] [address], [place] [phone]   Planned Parenthood    [phone]   [facility]    [phone]   [facility]  [address],  [place] Phone:  [phone], Fax:  [phone] Hours of Operation:  9 am - 6 pm, M-F.  Also accepts Medicaid/Medicare and self-pay.  [facility]  [address], [place] Phone: [phone], Fax: [phone]. Hours of Operation:  8:30 am - 5:30 pm, M-F.  Also accepts Medicaid and self-pay.  [facility] [place] [address] [place] Phone: [phone]   [facility] [address] Natasha Bence [place], [place] [phone], Ext. [phone] Mondays & Thursdays: 7-9 AM.  First 15 patients are seen on a first come, first serve basis.    Medicaid-accepting [facility] Providers:  Organization         Address  Phone   Notes  [facility] [address],  [phone] Also accepts self-pay patients.  [facility] [address] Laurell Josephs [place], [place]  [phone]   [facility] [address], [place] [phone]   [facility] [address], [place] [phone]   Renaye Rakers [address], [place]   [phone] Only accepts [place] Access [place] patients after they have their name applied to their card.   Self-Pay (no insurance) in [facility]:  Organization         Address  Phone   Notes  Sickle Cell Patients, [facility] [address] [place], [place] [phone]   [facility] [address] [place], [place] [phone]   [facility]  [address], [place] [phone]   [facility]/Dr. Osei-Bonsu  [address], [place] or [address], [place] [phone] Phone number for both [place] and [place] locations is the same.  [facility] [address] [place] [phone]   [facility] [address] [place] or [address] [phone]   [facility] [address] [place] [phone], phone; [phone], fax Sees patients 1st and 3rd Saturday of every month.  Must not qualify  for public or private insurance (i.e. Medicaid, Medicare, [place] Health Choice, Veterans' Benefits)  Household income should be no more than 200% of the poverty level The clinic cannot treat you if you are pregnant or think you are pregnant  Sexually transmitted diseases are not treated at the clinic.    Dental Care: Organization         Address  Phone  Notes  [facility] [address] [place], [place] [phone] Accepts children up to age 19 who are enrolled in [place] or [place] Health Choice; pregnant women with a Medicaid card; and children who have applied for Medicaid or [place] Health Choice, but were declined, whose parents can pay a reduced fee at time of service.  [facility] of [facility]  [address], [place] [phone] Accepts children up to age 75 who are enrolled in [place] or [place] Health Choice; pregnant women with a Medicaid card; and children who have applied for Medicaid or [place] Health Choice, but were declined, whose parents can pay a reduced fee at time of service.  [facility]  [address] [place], [place] [phone] Patients are seen by appointment only. Walk-ins are not accepted. [facility] will see patients 28 years of age and older. Monday - Tuesday (8am-5pm) Most Wednesdays (8:30-5pm) $30 per visit, cash only  [facility]  [address], [facility] [phone] Patients are seen by appointment only. Walk-ins are not accepted. [facility] will see patients 38 years of age and older. One Wednesday Evening (Monthly: Volunteer Based).  $30 per visit, cash only  [employer]   [phone] for adults; Children under age 31, call Graduate Pediatric Dentistry at [phone]. Children aged 61-14, please call [phone] to request a pediatric application.  Dental services are provided in all areas of dental care including fillings, crowns and bridges, complete and partial dentures, implants, gum treatment, root canals, and extractions. Preventive care is also provided. Treatment is provided to both adults and children. Patients are selected via a lottery and there is often a waiting list.   [facility] [address], [place]  [phone] [email or web address]   [facility] [address] [place], [place] [phone], Ext. [phone] Second and Fourth Thursday of each month, opens at 6:30 AM; Clinic ends at 9 AM.  Patients are seen on a first-come first-served basis, and a limited number are seen during each clinic.   [facility]  [address] Ether Griffins [place], [place] [phone]   Eligibility Requirements You must have lived in [place], [place], or [place] counties for at least the last three months.   You cannot be eligible for state or federal sponsored [place], including [employer], [place], or [employer].   You generally cannot be eligible for healthcare insurance through your employer.    How to apply: Eligibility screenings are held every Tuesday and Wednesday afternoon from 1:00 pm until 4:00 pm. You do not need an appointment for the interview!  [facility] [address], [place], [place] [phone]   [facility] Department  [phone]   [facility] Health Department  [phone]   [facility] Health Department  [phone]    Behavioral Health Resources in the Community: Intensive Outpatient Programs Organization         Address  Phone  Notes  [facility] [address] [place], [place] [phone]   [facility] Outpatient [address], [place], [place] [phone]   [facility] [address] [place], [place]  [phone]   [facility] [address],  [place], [place] [phone] or [phone]   Substance Abuse Resources Organization         Address  Phone  Notes  [facility]  [phone]   [facility]  [phone]   [facility] [place]  [phone]   Floydene Flock  [phone]   Residential & Outpatient Substance Abuse Program  [phone]   Psychological Services Organization         Address  Phone  Notes  [facility] Behavioral Health  [phone]   [facility]  [phone]   [facility] [address], [place] [phone] or [phone]    Mobile Crisis Teams Organization         Address  Phone  Notes  [facility], Mobile Crisis Care Unit  [phone]   [facility]  [address] [place], [place] [phone]   Doristine Locks [address] [place] [phone]    Self-Help/Support Groups Organization         Address  Phone             Notes  [facility] [place] - variety of support groups  336- I7437963 Call for more information  Narcotics Anonymous (NA), [facility] [address], [employer]   2 meetings at this location   Statistician         Address  Phone  Notes  [facility] [address],    [place]  [phone]   [facility]  [address], [place] [address], [place], [place] [phone]   [facility] [address] [place], [place] [phone] Admissions: 8am-3pm M-F  [facility] [address]    [place], [place] [phone]   [facility] [address] [place], [place], [place] [phone]   [facility] [address],  [place], [place] [phone]     [facility] - Intensive Outpatient [address] Laurell Josephs [address], [place], [place] [phone]   [facility] ([facility] [address] [place]  [place], [place] [phone] or [phone]   [facility] ([facility]) [address] [place], [place] [phone] Accepts Medicaid  [employer] [address],  [place] [phone] Substance Abuse/Addiction Treatment   [facility] Organization         Address  Phone  Notes  [facility]  [phone]   Angie Fava, PhD [address] Ervin Knack [place], [place]   [phone] or [phone]   [facility]   [address] [place], [place] [phone]   [facility] [address], [place], [place] [phone] Insurance/Medicaid/sponsorship through [employer] and Families [address]                                    [place], [place] [phone] Therapy/tele-psych/case  [facility] [address], [place] [phone]    Dr. Lolly Mustache  [phone]   [facility]  [place] Dept. 1) [address] [place] [address] [place] 2) [place] 3)  [place] [address], [place] [phone]  [phone]   [place] [phone] or [phone] (After Hours)

## 2014-02-05 NOTE — ED Notes (Signed)
Pt reports he has been withdrawing from Adderall for a couple months. States he wasn't able to afford his prescription and came off the medication. C/o bilateral leg and lower pain, inability to sleep.

## 2014-02-05 NOTE — ED Provider Notes (Signed)
CSN: [id number]     Arrival date & time 02/05/14  0258 History   First MD Initiated Contact with Patient 02/05/14 [phone]     Chief Complaint  Patient presents with  . Withdrawal     (Consider location/radiation/quality/duration/timing/severity/associated sxs/prior Treatment) HPI Comments: Patient presents to the ER stating that he is withdrawing from Adderall. Patient reports that he has a previous diagnosis of ADHD. His primary doctor prescribed him Adderall, but several months ago he lost his insurance and has not had any medication since. She reports that since he stopped taking the Adderall he has had decreased ability to sleep at night, it becomes very restless and has occasional pain in his legs and back. No homicidality or suicidality.   Past Medical History  Diagnosis Date  . Seasonal allergies   . ADD (attention deficit disorder)    History reviewed. No pertinent past surgical history. No family history on file. History  Substance Use Topics  . Smoking status: Never Smoker   . Smokeless tobacco: Current User    Types: Chew  . Alcohol Use: No    Review of Systems  Musculoskeletal: Positive for back pain.  Psychiatric/Behavioral: Positive for sleep disturbance. Negative for suicidal ideas.  All other systems reviewed and are negative.     Allergies  Iohexol  Home Medications   Prior to Admission medications   Medication Sig Start Date End Date Taking? Authorizing Provider  amphetamine-dextroamphetamine (ADDERALL) 20 MG tablet Take 1 tablet (20 mg total) by mouth 2 (two) times daily. 08/17/13   Donnetta Hutching, MD   BP 113/64  Pulse 61  Temp(Src) 97.6 F (36.4 C) (Oral)  Resp 20  Ht  (1.753 m)  Wt 130 lb (58.968 kg)  BMI 19.19 kg/m2  SpO2 100% Physical Exam  Constitutional: He is oriented to person, place, and time. He appears well-developed and well-nourished. No distress.  HENT:  Head: Normocephalic and atraumatic.  Right Ear: Hearing normal.  Left Ear:  Hearing normal.  Nose: Nose normal.  Mouth/Throat: Oropharynx is clear and moist and mucous membranes are normal.  Eyes: Conjunctivae and EOM are normal. Pupils are equal, round, and reactive to light.  Neck: Normal range of motion. Neck supple.  Cardiovascular: Regular rhythm, S1 normal and S2 normal.  Exam reveals no gallop and no friction rub.   No murmur heard. Pulmonary/Chest: Effort normal and breath sounds normal. No respiratory distress. He exhibits no tenderness.  Abdominal: Soft. Normal appearance and bowel sounds are normal. There is no hepatosplenomegaly. There is no tenderness. There is no rebound, no guarding, no tenderness at McBurney's point and negative Murphy's sign. No hernia.  Musculoskeletal: Normal range of motion.  Neurological: He is alert and oriented to person, place, and time. He has normal strength. No cranial nerve deficit or sensory deficit. Coordination normal. GCS eye subscore is 4. GCS verbal subscore is 5. GCS motor subscore is 6.  Skin: Skin is warm, dry and intact. No rash noted. No cyanosis.  Psychiatric: He has a normal mood and affect. His speech is normal and behavior is normal. Thought content normal.    ED Course  Procedures (including critical care time) Labs Review Labs Reviewed - No data to display  Imaging Review No results found.   EKG Interpretation None      MDM   Final diagnoses:  None    Patient presents with concerns over possible withdrawal from Adderall. This is not, however, have the medication for several months. This is not withdrawal.  Patient has degraded the fact that he has insomnia and occasionally has back and leg pain with stopping the medication. I informed him that his symptoms were not consistent with a withdrawal syndrome. If he is having symptoms of his ADHD, he needs to followup with psychiatry for possible repeat administration of Adderall. He was given contact information.  Patient is not homicidal or suicidal.  He reports back pain and leg pain, but examination is unremarkable. He does not have any change in bowel or bladder function, change in strength or sensation in the lower extremities. No findings on examination or history that would be concerning for lumbar or thoracic lesion, no workup necessary.    Gilda Crease, MD 02/05/14 [phone]

## 2014-06-29 ENCOUNTER — Encounter (HOSPITAL_COMMUNITY): Payer: Self-pay | Admitting: Emergency Medicine

## 2014-06-29 ENCOUNTER — Emergency Department (HOSPITAL_COMMUNITY)
Admission: EM | Admit: 2014-06-29 | Discharge: 2014-06-29 | Disposition: A | Discharge status: Home / Self Care | Payer: Self-pay | Attending: Emergency Medicine | Admitting: Emergency Medicine

## 2014-06-29 DIAGNOSIS — R5383 Other fatigue: Secondary | ICD-10-CM | POA: Insufficient documentation

## 2014-06-29 DIAGNOSIS — Z791 Long term (current) use of non-steroidal anti-inflammatories (NSAID): Secondary | ICD-10-CM | POA: Insufficient documentation

## 2014-06-29 DIAGNOSIS — G479 Sleep disorder, unspecified: Secondary | ICD-10-CM | POA: Insufficient documentation

## 2014-06-29 DIAGNOSIS — G4700 Insomnia, unspecified: Secondary | ICD-10-CM | POA: Insufficient documentation

## 2014-06-29 DIAGNOSIS — Z8709 Personal history of other diseases of the respiratory system: Secondary | ICD-10-CM | POA: Insufficient documentation

## 2014-06-29 DIAGNOSIS — F909 Attention-deficit hyperactivity disorder, unspecified type: Secondary | ICD-10-CM | POA: Insufficient documentation

## 2014-06-29 DIAGNOSIS — Z79899 Other long term (current) drug therapy: Secondary | ICD-10-CM | POA: Insufficient documentation

## 2014-06-29 DIAGNOSIS — F419 Anxiety disorder, unspecified: Secondary | ICD-10-CM | POA: Insufficient documentation

## 2014-06-29 MED ORDER — LORAZEPAM 1 MG PO TABS: 1.0000 mg | ORAL_TABLET | Freq: Every day | ORAL | Status: DC | PRN

## 2014-06-29 MED ORDER — HYDROXYZINE HCL 25 MG PO TABS: 25.0000 mg | ORAL_TABLET | Freq: Every evening | ORAL | Status: DC | PRN

## 2014-06-29 NOTE — ED Provider Notes (Signed)
CSN: [id number]     Arrival date & time 06/29/14  1522 History   First MD Initiated Contact with Patient 06/29/14 1635     Chief Complaint  Patient presents with  . Withdrawal      HPI  She presents for evaluation stating he is "having withdrawal from my Adderall". History of ADHD since he was age 24. Was on Ritalin initially with poor response. Has been on Adderall according to mom since he was 10 or 11". Did not have the money to see primary care or fill a prescription this is been off that for the last 3 weeks. States she's having trouble focusing. Sleeping poorly. Occasional anxiety. Denies being homicidal suicidal or frankly depressed. Good appetite. Poor energy level.  Past Medical History  Diagnosis Date  . Seasonal allergies   . ADD (attention deficit disorder)    History reviewed. No pertinent past surgical history. Family History  Problem Relation Age of Onset  . Cancer Other    History  Substance Use Topics  . Smoking status: Never Smoker   . Smokeless tobacco: Current User    Types: Chew  . Alcohol Use: No    Review of Systems  Constitutional: Positive for activity change and fatigue. Negative for fever, chills, diaphoresis and appetite change.  HENT: Negative for mouth sores, sore throat and trouble swallowing.   Eyes: Negative for visual disturbance.  Respiratory: Negative for cough, chest tightness, shortness of breath and wheezing.   Cardiovascular: Negative for chest pain.  Gastrointestinal: Negative for nausea, vomiting, abdominal pain, diarrhea and abdominal distention.  Endocrine: Negative for polydipsia, polyphagia and polyuria.  Genitourinary: Negative for dysuria, frequency and hematuria.  Musculoskeletal: Negative for gait problem.  Skin: Negative for color change, pallor and rash.  Neurological: Negative for dizziness, syncope, light-headedness and headaches.  Hematological: Does not bruise/bleed easily.  Psychiatric/Behavioral: Positive for sleep  disturbance. Negative for behavioral problems and confusion. The patient is nervous/anxious.       Allergies  Iohexol  Home Medications   Prior to Admission medications   Medication Sig Start Date End Date Taking? Authorizing Provider  amphetamine-dextroamphetamine (ADDERALL) 20 MG tablet Take 1 tablet (20 mg total) by mouth 2 (two) times daily. 08/17/13  Yes Donnetta Hutching, MD  ferrous sulfate 325 (65 FE) MG EC tablet Take 325 mg by mouth daily.   Yes Historical Provider, MD  Misc Natural Products (FOCUSED MIND PO) Take 1 tablet by mouth daily.   Yes Historical Provider, MD  hydrOXYzine (ATARAX/VISTARIL) 25 MG tablet Take 1 tablet (25 mg total) by mouth at bedtime as needed (sleep). 06/29/14   Rolland Porter, MD  ibuprofen (ADVIL,MOTRIN) 800 MG tablet Take 1 tablet (800 mg total) by mouth 3 (three) times daily. Patient not taking: Reported on 06/29/2014 02/05/14   Gilda Crease, MD  LORazepam (ATIVAN) 1 MG tablet Take 1 tablet (1 mg total) by mouth daily as needed for anxiety. 06/29/14   Rolland Porter, MD   BP 130/66 mmHg  Pulse 87  Temp(Src) 98.7 F (37.1 C) (Oral)  Resp 16  Ht  (1.778 m)  Wt 130 lb (58.968 kg)  BMI 18.65 kg/m2  SpO2 100% Physical Exam  Constitutional: He is oriented to person, place, and time. He appears well-developed and well-nourished. No distress.  HENT:  Head: Normocephalic.  Eyes: Conjunctivae are normal. Pupils are equal, round, and reactive to light. No scleral icterus.  Neck: Normal range of motion. Neck supple. No thyromegaly present.  Cardiovascular: Normal rate and  regular rhythm.  Exam reveals no gallop and no friction rub.   No murmur heard. Pulmonary/Chest: Effort normal and breath sounds normal. No respiratory distress. He has no wheezes. He has no rales.  Abdominal: Soft. Bowel sounds are normal. He exhibits no distension. There is no tenderness. There is no rebound.  Musculoskeletal: Normal range of motion.  Neurological: He is alert and  oriented to person, place, and time.  Skin: Skin is warm and dry. No rash noted.  Psychiatric: He has a normal mood and affect. His behavior is normal.    ED Course  Procedures (including critical care time) Labs Review Labs Reviewed - No data to display  Imaging Review No results found.   EKG Interpretation None      MDM   Final diagnoses:  Attention deficit hyperactivity disorder (ADHD), unspecified ADHD type  Anxiety  Insomnia    I discussed with the patient and his mother that Adderall is a controlled substance that should only be prescribed by an ongoing primary care provider or psychiatrist area in discussing his symptoms other than the "trouble focusing". States he has episodes of anxiety, although Z these are infrequent. Also complains of nightly insomnia. I do feel, writing him a limited number of 10 Ativan when necessary for sleep. Caution regarding working or driving. Hospital Atarax for sleep. Encouraged primary care follow-up.    Rolland PorterMark Mannat Benedetti, MD 06/29/14 [phone]

## 2014-06-29 NOTE — ED Notes (Signed)
Pt reports he is having withdrawal symptoms after being off his medication for a couple of months. ADHD med. Pt c/o body aches, nausea, shaking on occas.

## 2014-06-29 NOTE — Discharge Instructions (Signed)
Attention Deficit Hyperactivity Disorder Attention deficit hyperactivity disorder (ADHD) is a problem with behavior issues based on the way the brain functions (neurobehavioral disorder). It is a common reason for behavior and academic problems in school. SYMPTOMS  There are 3 types of ADHD. The 3 types and some of the symptoms include:  Inattentive.  Gets bored or distracted easily.  Loses or forgets things. Forgets to hand in homework.  Has trouble organizing or completing tasks.  Difficulty staying on task.  An inability to organize daily tasks and school work.  Leaving projects, chores, or homework unfinished.  Trouble paying attention or responding to details. Careless mistakes.  Difficulty following directions. Often seems like is not listening.  Dislikes activities that require sustained attention (like chores or homework).  Hyperactive-impulsive.  Feels like it is impossible to sit still or stay in a seat. Fidgeting with hands and feet.  Trouble waiting turn.  Talking too much or out of turn. Interruptive.  Speaks or acts impulsively.  Aggressive, disruptive behavior.  Constantly busy or on the go; noisy.  Often leaves seat when they are expected to remain seated.  Often runs or climbs where it is not appropriate, or feels very restless.  Combined.  Has symptoms of both of the above. Often children with ADHD feel discouraged about themselves and with school. They often perform well below their abilities in school. As children get older, the excess motor activities can calm down, but the problems with paying attention and staying organized persist. Most children do not outgrow ADHD but with good treatment can learn to cope with the symptoms. DIAGNOSIS  When ADHD is suspected, the diagnosis should be made by professionals trained in ADHD. This professional will collect information about the individual suspected of having ADHD. Information must be collected from  various settings where the person lives, works, or attends school.  Diagnosis will include:  Confirming symptoms began in childhood.  Ruling out other reasons for the child's behavior.  The health care providers will check with the child's school and check their medical records.  They will talk to teachers and parents.  Behavior rating scales for the child will be filled out by those dealing with the child on a daily basis. A diagnosis is made only after all information has been considered. TREATMENT  Treatment usually includes behavioral treatment, tutoring or extra support in school, and stimulant medicines. Because of the way a person's brain works with ADHD, these medicines decrease impulsivity and hyperactivity and increase attention. This is different than how they would work in a person who does not have ADHD. Other medicines used include antidepressants and certain blood pressure medicines. Most experts agree that treatment for ADHD should address all aspects of the person's functioning. Along with medicines, treatment should include structured classroom management at school. Parents should reward good behavior, provide constant discipline, and set limits. Tutoring should be available for the child as needed. ADHD is a lifelong condition. If untreated, the disorder can have long-term serious effects into adolescence and adulthood. HOME CARE INSTRUCTIONS   Often with ADHD there is a lot of frustration among family members dealing with the condition. Blame and anger are also feelings that are common. In many cases, because the problem affects the family as a whole, the entire family may need help. A therapist can help the family find better ways to handle the disruptive behaviors of the person with ADHD and promote change. If the person with ADHD is young, most of the therapist's  work is with the parents. Parents will learn techniques for coping with and improving their child's behavior.  Sometimes only the child with the ADHD needs counseling. Your health care providers can help you make these decisions.  Children with ADHD may need help learning how to organize. Some helpful tips include:  Keep routines the same every day from wake-up time to bedtime. Schedule all activities, including homework and playtime. Keep the schedule in a place where the person with ADHD will often see it. Liyat Faulkenberry schedule changes as far in advance as possible.  Schedule outdoor and indoor recreation.  Have a place for everything and keep everything in its place. This includes clothing, backpacks, and school supplies.  Encourage writing down assignments and bringing home needed books. Work with your child's teachers for assistance in organizing school work.  Offer your child a well-balanced diet. Breakfast that includes a balance of whole grains, protein, and fruits or vegetables is especially important for school performance. Children should avoid drinks with caffeine including:  Soft drinks.  Coffee.  Tea.  However, some older children (adolescents) may find these drinks helpful in improving their attention. Because it can also be common for adolescents with ADHD to become addicted to caffeine, talk with your health care provider about what is a safe amount of caffeine intake for your child.  Children with ADHD need consistent rules that they can understand and follow. If rules are followed, give small rewards. Children with ADHD often receive, and expect, criticism. Look for good behavior and praise it. Set realistic goals. Give clear instructions. Look for activities that can foster success and self-esteem. Make time for pleasant activities with your child. Give lots of affection.  Parents are their children's greatest advocates. Learn as much as possible about ADHD. This helps you become a stronger and better advocate for your child. It also helps you educate your child's teachers and instructors  if they feel inadequate in these areas. Parent support groups are often helpful. A national group with local chapters is called Children and Adults with Attention Deficit Hyperactivity Disorder (CHADD). SEEK MEDICAL CARE IF:  Your child has repeated muscle twitches, cough, or speech outbursts.  Your child has sleep problems.  Your child has a marked loss of appetite.  Your child develops depression.  Your child has new or worsening behavioral problems.  Your child develops dizziness.  Your child has a racing heart.  Your child has stomach pains.  Your child develops headaches. SEEK IMMEDIATE MEDICAL CARE IF:  Your child has been diagnosed with depression or anxiety and the symptoms seem to be getting worse.  Your child has been depressed and suddenly appears to have increased energy or motivation.  You are worried that your child is having a bad reaction to a medication he or she is taking for ADHD. Document Released: 05/11/2002 Document Revised: 05/26/2013 Document Reviewed: 01/26/2013 [facility] Patient Information 2015 [place], [place]. This information is not intended to replace advice given to you by your health care provider. Make sure you discuss any questions you have with your health care provider.  Insomnia Insomnia is frequent trouble falling and/or staying asleep. Insomnia can be a long term problem or a short term problem. Both are common. Insomnia can be a short term problem when the wakefulness is related to a certain stress or worry. Long term insomnia is often related to ongoing stress during waking hours and/or poor sleeping habits. Overtime, sleep deprivation itself can make the problem worse. Every little thing  feels more severe because you are overtired and your ability to cope is decreased. CAUSES   Stress, anxiety, and depression.  Poor sleeping habits.  Distractions such as TV in the bedroom.  Naps close to bedtime.  Engaging in emotionally charged  conversations before bed.  Technical reading before sleep.  Alcohol and other sedatives. They may make the problem worse. They can hurt normal sleep patterns and normal dream activity.  Stimulants such as caffeine for several hours prior to bedtime.  Pain syndromes and shortness of breath can cause insomnia.  Exercise late at night.  Changing time zones may cause sleeping problems (jet lag). It is sometimes helpful to have someone observe your sleeping patterns. They should look for periods of not breathing during the night (sleep apnea). They should also look to see how long those periods last. If you live alone or observers are uncertain, you can also be observed at a sleep clinic where your sleep patterns will be professionally monitored. Sleep apnea requires a checkup and treatment. Give your caregivers your medical history. Give your caregivers observations your family has made about your sleep.  SYMPTOMS   Not feeling rested in the morning.  Anxiety and restlessness at bedtime.  Difficulty falling and staying asleep. TREATMENT   Your caregiver may prescribe treatment for an underlying medical disorders. Your caregiver can give advice or help if you are using alcohol or other drugs for self-medication. Treatment of underlying problems will usually eliminate insomnia problems.  Medications can be prescribed for short time use. They are generally not recommended for lengthy use.  Over-the-counter sleep medicines are not recommended for lengthy use. They can be habit forming.  You can promote easier sleeping by making lifestyle changes such as:  Using relaxation techniques that help with breathing and reduce muscle tension.  Exercising earlier in the day.  Changing your diet and the time of your last meal. No night time snacks.  Establish a regular time to go to bed.  Counseling can help with stressful problems and worry.  Soothing music and white noise may be helpful if  there are background noises you cannot remove.  Stop tedious detailed work at least one hour before bedtime. HOME CARE INSTRUCTIONS   Keep a diary. Inform your caregiver about your progress. This includes any medication side effects. See your caregiver regularly. Take note of:  Times when you are asleep.  Times when you are awake during the night.  The quality of your sleep.  How you feel the next day. This information will help your caregiver care for you.  Get out of bed if you are still awake after 15 minutes. Read or do some quiet activity. Keep the lights down. Wait until you feel sleepy and go back to bed.  Keep regular sleeping and waking hours. Avoid naps.  Exercise regularly.  Avoid distractions at bedtime. Distractions include watching television or engaging in any intense or detailed activity like attempting to balance the household checkbook.  Develop a bedtime ritual. Keep a familiar routine of bathing, brushing your teeth, climbing into bed at the same time each night, listening to soothing music. Routines increase the success of falling to sleep faster.  Use relaxation techniques. This can be using breathing and muscle tension release routines. It can also include visualizing peaceful scenes. You can also help control troubling or intruding thoughts by keeping your mind occupied with boring or repetitive thoughts like the old concept of counting sheep. You can make it more creative  like imagining planting one beautiful flower after another in your backyard garden.  During your day, work to eliminate stress. When this is not possible use some of the previous suggestions to help reduce the anxiety that accompanies stressful situations. MAKE SURE YOU:   Understand these instructions.  Will watch your condition.  Will get help right away if you are not doing well or get worse. Document Released: 05/18/2000 Document Revised: 08/13/2011 Document Reviewed:  06/18/2007 [facility] Patient Information 2015 [place], [place]. This information is not intended to replace advice given to you by your health care provider. Make sure you discuss any questions you have with your health care provider.  Panic Attacks Panic attacks are sudden, short feelings of great fear or discomfort. You may have them for no reason when you are relaxed, when you are uneasy (anxious), or when you are sleeping.  HOME CARE  Take all your medicines as told.  Check with your doctor before starting new medicines.  Keep all doctor visits. GET HELP IF:  You are not able to take your medicines as told.  Your symptoms do not get better.  Your symptoms get worse. GET HELP RIGHT AWAY IF:  Your attacks seem different than your normal attacks.  You have thoughts about hurting yourself or others.  You take panic attack medicine and you have a side effect. MAKE SURE YOU:  Understand these instructions.  Will watch your condition.  Will get help right away if you are not doing well or get worse. Document Released: 06/23/2010 Document Revised: 03/11/2013 Document Reviewed: 01/02/2013 [facility] Patient Information 2015 [place], [place]. This information is not intended to replace advice given to you by your health care provider. Make sure you discuss any questions you have with your health care provider.

## 2014-07-13 ENCOUNTER — Encounter (HOSPITAL_COMMUNITY): Payer: Self-pay | Admitting: *Deleted

## 2014-07-13 ENCOUNTER — Emergency Department (HOSPITAL_COMMUNITY)
Admission: EM | Admit: 2014-07-13 | Discharge: 2014-07-13 | Discharge status: Against Medical Advice | Payer: Self-pay | Attending: Emergency Medicine | Admitting: Emergency Medicine

## 2014-07-13 ENCOUNTER — Emergency Department (HOSPITAL_COMMUNITY): Payer: Self-pay

## 2014-07-13 DIAGNOSIS — R339 Retention of urine, unspecified: Secondary | ICD-10-CM

## 2014-07-13 DIAGNOSIS — R1011 Right upper quadrant pain: Secondary | ICD-10-CM | POA: Insufficient documentation

## 2014-07-13 DIAGNOSIS — F909 Attention-deficit hyperactivity disorder, unspecified type: Secondary | ICD-10-CM | POA: Insufficient documentation

## 2014-07-13 DIAGNOSIS — R109 Unspecified abdominal pain: Secondary | ICD-10-CM

## 2014-07-13 DIAGNOSIS — M545 Low back pain: Secondary | ICD-10-CM | POA: Insufficient documentation

## 2014-07-13 LAB — CBC WITH DIFFERENTIAL/PLATELET
Basophils Absolute: 0 10*3/uL (ref 0.0–0.1)
Basophils Relative: 1 % (ref 0–1)
EOS ABS: 0.3 10*3/uL (ref 0.0–0.7)
EOS PCT: 4 % (ref 0–5)
HEMATOCRIT: 41.5 % (ref 39.0–52.0)
Hemoglobin: 14 g/dL (ref 13.0–17.0)
Lymphocytes Relative: 35 % (ref 12–46)
Lymphs Abs: 2.3 10*3/uL (ref 0.7–4.0)
MCH: 30.5 pg (ref 26.0–34.0)
MCHC: 33.7 g/dL (ref 30.0–36.0)
MCV: 90.4 fL (ref 78.0–100.0)
Monocytes Absolute: 0.5 10*3/uL (ref 0.1–1.0)
Monocytes Relative: 8 % (ref 3–12)
Neutro Abs: 3.4 10*3/uL (ref 1.7–7.7)
Neutrophils Relative %: 52 % (ref 43–77)
PLATELETS: 263 10*3/uL (ref 150–400)
RBC: 4.59 MIL/uL (ref 4.22–5.81)
RDW: 12.3 % (ref 11.5–15.5)
WBC: 6.5 10*3/uL (ref 4.0–10.5)

## 2014-07-13 LAB — COMPREHENSIVE METABOLIC PANEL
ALK PHOS: 68 U/L (ref 39–117)
ALT: 12 U/L (ref 0–53)
AST: 15 U/L (ref 0–37)
Albumin: 4.5 g/dL (ref 3.5–5.2)
Anion gap: 3 — ABNORMAL LOW (ref 5–15)
BILIRUBIN TOTAL: 1.4 mg/dL — AB (ref 0.3–1.2)
BUN: 15 mg/dL (ref 6–23)
CO2: 31 mmol/L (ref 19–32)
Calcium: 8.9 mg/dL (ref 8.4–10.5)
Chloride: 105 mmol/L (ref 96–112)
Creatinine, Ser: 0.83 mg/dL (ref 0.50–1.35)
Glucose, Bld: 105 mg/dL — ABNORMAL HIGH (ref 70–99)
POTASSIUM: 4.2 mmol/L (ref 3.5–5.1)
SODIUM: 139 mmol/L (ref 135–145)
Total Protein: 7.2 g/dL (ref 6.0–8.3)

## 2014-07-13 NOTE — ED Provider Notes (Signed)
CSN: [id number]     Arrival date & time 07/13/14  2050 History   First MD Initiated Contact with Patient 07/13/14 2103     Chief Complaint  Patient presents with  . Urinary Retention     (Consider location/radiation/quality/duration/timing/severity/associated sxs/prior Treatment) The history is provided by the patient.   patient presents with right flank pain and difficulty urinating. States he has not urinated all today. States he has pain in his abdomen and back. He has had some constipation. States he feels as if he has had to urinate. States he has been drinking fluids to try and help. No previous history of this. No nausea vomiting. He states his back hurts also. He denies recent change in medication.  Past Medical History  Diagnosis Date  . Seasonal allergies   . ADD (attention deficit disorder)    History reviewed. No pertinent past surgical history. Family History  Problem Relation Age of Onset  . Cancer Other    History  Substance Use Topics  . Smoking status: Never Smoker   . Smokeless tobacco: Current User    Types: Chew, Snuff  . Alcohol Use: No    Review of Systems  Constitutional: Negative for fever and activity change.  Respiratory: Negative for shortness of breath.   Gastrointestinal: Positive for abdominal pain.  Genitourinary: Positive for flank pain and difficulty urinating. Negative for dysuria, hematuria and genital sores.  Musculoskeletal: Positive for back pain.  Skin: Negative for wound.  Neurological: Negative for headaches.  Hematological: Does not bruise/bleed easily.      Allergies  Iohexol  Home Medications   Prior to Admission medications   Medication Sig Start Date End Date Taking? Authorizing Provider  amphetamine-dextroamphetamine (ADDERALL) 20 MG tablet Take 1 tablet (20 mg total) by mouth 2 (two) times daily. 08/17/13   Donnetta HutchingBrian Cook, MD  ferrous sulfate 325 (65 FE) MG EC tablet Take 325 mg by mouth daily.    Historical Provider, MD   hydrOXYzine (ATARAX/VISTARIL) 25 MG tablet Take 1 tablet (25 mg total) by mouth at bedtime as needed (sleep). 06/29/14   Rolland PorterMark James, MD  ibuprofen (ADVIL,MOTRIN) 800 MG tablet Take 1 tablet (800 mg total) by mouth 3 (three) times daily. Patient not taking: Reported on 06/29/2014 02/05/14   Gilda Creasehristopher J. Pollina, MD  LORazepam (ATIVAN) 1 MG tablet Take 1 tablet (1 mg total) by mouth daily as needed for anxiety. 06/29/14   Rolland PorterMark James, MD  Misc Natural Products (FOCUSED MIND PO) Take 1 tablet by mouth daily.    Historical Provider, MD   BP 122/68 mmHg  Pulse 70  Temp(Src) 97.7 F (36.5 C) (Oral)  Resp 20  Ht 5\' 10"  (1.778 m)  Wt 135 lb (61.236 kg)  BMI 19.37 kg/m2  SpO2 100% Physical Exam  Constitutional: He appears well-developed.  HENT:  Head: Normocephalic.  Neck: Neck supple.  Cardiovascular: Normal rate.   Pulmonary/Chest: Effort normal.  Abdominal: Soft. There is tenderness.  Tenderness on right upper abdomen. No masses. He is slender. No suprapubic mass.   Musculoskeletal:  Right lower back tenderness.  Skin: Skin is warm.    ED Course  Procedures (including critical care time) Labs Review Labs Reviewed  URINALYSIS, ROUTINE W REFLEX MICROSCOPIC  CBC WITH DIFFERENTIAL/PLATELET  COMPREHENSIVE METABOLIC PANEL    Imaging Review No results found.   EKG Interpretation None      MDM   Final diagnoses:  Flank pain    Patient with reported urinary retention. States he has not urinated  all day. Some bladder distention on CT scan. Patient refused Foley catheter also has right-sided abdominal pain. He does have a somewhat distended stomach. Bladder outlet obstruction is possible, however patient also   states that he ate a whole container [place] food right before he got here. Patient is leaving AMA since he will not allow Foley catheter. Will need to follow-up with urology and gastroenterology.   Juliet Rude. Rubin Payor, MD 07/13/14 2214

## 2014-07-13 NOTE — ED Notes (Signed)
Instructed pt on need for urine sample 

## 2014-07-13 NOTE — ED Notes (Signed)
Pt states he has not been able to pee all day. He states his back and abdomen is killing him.

## 2014-07-13 NOTE — ED Notes (Signed)
Patient went to restroom, states that he is  unable to void at this time.

## 2014-07-13 NOTE — Discharge Instructions (Signed)
You have some urinary retention. The way to relieve this would be with a catheter. You have refused this This could cause kidney failure. Feel free to return for further evaluation. Return if you still are unable to urinate. Your CT scan also showed possible stomach problems. This could be due to the [place] food he just ate but also could be an obstruction. His needs further evaluation.  Acute Urinary Retention Acute urinary retention is the temporary inability to urinate. This is a common problem in older men. As men age their prostates become larger and block the flow of urine from the bladder. This is usually a problem that has come on gradually.  HOME CARE INSTRUCTIONS If you are sent home with a Foley catheter and a drainage system, you will need to discuss the best course of action with your health care provider. While the catheter is in, maintain a good intake of fluids. Keep the drainage bag emptied and lower than your catheter. This is so that contaminated urine will not flow back into your bladder, which could lead to a urinary tract infection. There are two main types of drainage bags. One is a large bag that usually is used at night. It has a good capacity that will allow you to sleep through the night without having to empty it. The second type is called a leg bag. It has a smaller capacity, so it needs to be emptied more frequently. However, the main advantage is that it can be attached by a leg strap and can go underneath your clothing, allowing you the freedom to move about or leave your home. Only take over-the-counter or prescription medicines for pain, discomfort, or fever as directed by your health care provider.  SEEK MEDICAL CARE IF:  You develop a low-grade fever.  You experience spasms or leakage of urine with the spasms. SEEK IMMEDIATE MEDICAL CARE IF:   You develop chills or fever.  Your catheter stops draining urine.  Your catheter falls out.  You start to develop  increased bleeding that does not respond to rest and increased fluid intake. MAKE SURE YOU:  Understand these instructions.  Will watch your condition.  Will get help right away if you are not doing well or get worse. Document Released: 08/27/2000 Document Revised: 05/26/2013 Document Reviewed: 10/30/2012 [facility] Patient Information 2015 [place], [place]. This information is not intended to replace advice given to you by your health care provider. Make sure you discuss any questions you have with your health care provider.

## 2014-07-30 ENCOUNTER — Emergency Department (HOSPITAL_COMMUNITY)
Admission: EM | Admit: 2014-07-30 | Discharge: 2014-07-30 | Disposition: A | Discharge status: Home / Self Care | Payer: Self-pay | Attending: Emergency Medicine | Admitting: Emergency Medicine

## 2014-07-30 ENCOUNTER — Encounter (HOSPITAL_COMMUNITY): Payer: Self-pay | Admitting: Emergency Medicine

## 2014-07-30 ENCOUNTER — Emergency Department (HOSPITAL_COMMUNITY): Payer: Self-pay

## 2014-07-30 DIAGNOSIS — Y92410 Unspecified street and highway as the place of occurrence of the external cause: Secondary | ICD-10-CM | POA: Insufficient documentation

## 2014-07-30 DIAGNOSIS — F909 Attention-deficit hyperactivity disorder, unspecified type: Secondary | ICD-10-CM | POA: Insufficient documentation

## 2014-07-30 DIAGNOSIS — Y998 Other external cause status: Secondary | ICD-10-CM | POA: Insufficient documentation

## 2014-07-30 DIAGNOSIS — Z8709 Personal history of other diseases of the respiratory system: Secondary | ICD-10-CM | POA: Insufficient documentation

## 2014-07-30 DIAGNOSIS — S01112A Laceration without foreign body of left eyelid and periocular area, initial encounter: Secondary | ICD-10-CM | POA: Insufficient documentation

## 2014-07-30 DIAGNOSIS — Z79899 Other long term (current) drug therapy: Secondary | ICD-10-CM | POA: Insufficient documentation

## 2014-07-30 DIAGNOSIS — Z23 Encounter for immunization: Secondary | ICD-10-CM | POA: Insufficient documentation

## 2014-07-30 DIAGNOSIS — Y9389 Activity, other specified: Secondary | ICD-10-CM | POA: Insufficient documentation

## 2014-07-30 DIAGNOSIS — S199XXA Unspecified injury of neck, initial encounter: Secondary | ICD-10-CM | POA: Insufficient documentation

## 2014-07-30 DIAGNOSIS — S0990XA Unspecified injury of head, initial encounter: Secondary | ICD-10-CM | POA: Insufficient documentation

## 2014-07-30 MED ORDER — BACITRACIN-NEOMYCIN-POLYMYXIN 400-5-5000 EX OINT
TOPICAL_OINTMENT | Freq: Once | CUTANEOUS | Status: AC
  Administered 2014-07-30: 1 via TOPICAL
  Filled 2014-07-30: qty 4

## 2014-07-30 MED ORDER — TETANUS-DIPHTH-ACELL PERTUSSIS 5-2.5-18.5 LF-MCG/0.5 IM SUSP
0.5000 mL | Freq: Once | INTRAMUSCULAR | Status: AC
  Administered 2014-07-30: 0.5 mL via INTRAMUSCULAR
  Filled 2014-07-30: qty 0.5

## 2014-07-30 MED ORDER — LIDOCAINE-EPINEPHRINE (PF) 1 %-1:200000 IJ SOLN
10.0000 mL | Freq: Once | INTRAMUSCULAR | Status: AC
  Administered 2014-07-30: 10 mL via INTRADERMAL
  Filled 2014-07-30: qty 10

## 2014-07-30 MED ORDER — HYDROCODONE-ACETAMINOPHEN 5-325 MG PO TABS: 1.0000 | ORAL_TABLET | ORAL | Status: DC | PRN

## 2014-07-30 MED ORDER — IBUPROFEN 800 MG PO TABS: 800.0000 mg | ORAL_TABLET | Freq: Three times a day (TID) | ORAL | Status: DC | PRN

## 2014-07-30 MED ORDER — OXYCODONE-ACETAMINOPHEN 5-325 MG PO TABS
2.0000 | ORAL_TABLET | Freq: Once | ORAL | Status: AC
  Administered 2014-07-30: 2 via ORAL
  Filled 2014-07-30: qty 2

## 2014-07-30 NOTE — Discharge Instructions (Signed)
Facial Laceration ° A facial laceration is a cut on the face. These injuries can be painful and cause bleeding. Lacerations usually heal quickly, but they need special care to reduce scarring. °DIAGNOSIS  °Your health care provider will take a medical history, ask for details about how the injury occurred, and examine the wound to determine how deep the cut is. °TREATMENT  °Some facial lacerations may not require closure. Others may not be able to be closed because of an increased risk of infection. The risk of infection and the chance for successful closure will depend on various factors, including the amount of time since the injury occurred. °The wound may be cleaned to help prevent infection. If closure is appropriate, pain medicines may be given if needed. Your health care provider will use stitches (sutures), wound glue (adhesive), or skin adhesive strips to repair the laceration. These tools bring the skin edges together to allow for faster healing and a better cosmetic outcome. If needed, you may also be given a tetanus shot. °HOME CARE INSTRUCTIONS °· Only take over-the-counter or prescription medicines as directed by your health care provider. °· Follow your health care provider's instructions for wound care. These instructions will vary depending on the technique used for closing the wound. °For Sutures: °· Keep the wound clean and dry.   °· If you were given a bandage (dressing), you should change it at least once a day. Also change the dressing if it becomes wet or dirty, or as directed by your health care provider.   °· Wash the wound with soap and water 2 times a day. Rinse the wound off with water to remove all soap. Pat the wound dry with a clean towel.   °· After cleaning, apply a thin layer of the antibiotic ointment recommended by your health care provider. This will help prevent infection and keep the dressing from sticking.   °· You may shower as usual after the first 24 hours. Do not soak the  wound in water until the sutures are removed.   °· Get your sutures removed as directed by your health care provider. With facial lacerations, sutures should usually be taken out after 4-5 days to avoid stitch marks.   °· Wait a few days after your sutures are removed before applying any makeup. °For Skin Adhesive Strips: °· Keep the wound clean and dry.   °· Do not get the skin adhesive strips wet. You may bathe carefully, using caution to keep the wound dry.   °· If the wound gets wet, pat it dry with a clean towel.   °· Skin adhesive strips will fall off on their own. You may trim the strips as the wound heals. Do not remove skin adhesive strips that are still stuck to the wound. They will fall off in time.   °For Wound Adhesive: °· You may briefly wet your wound in the shower or bath. Do not soak or scrub the wound. Do not swim. Avoid periods of heavy sweating until the skin adhesive has fallen off on its own. After showering or bathing, gently pat the wound dry with a clean towel.   °· Do not apply liquid medicine, cream medicine, ointment medicine, or makeup to your wound while the skin adhesive is in place. This may loosen the film before your wound is healed.   °· If a dressing is placed over the wound, be careful not to apply tape directly over the skin adhesive. This may cause the adhesive to be pulled off before the wound is healed.   °· Avoid   prolonged exposure to sunlight or tanning lamps while the skin adhesive is in place.  The skin adhesive will usually remain in place for 5-10 days, then naturally fall off the skin. Do not pick at the adhesive film.  After Healing: Once the wound has healed, cover the wound with sunscreen during the day for 1 full year. This can help minimize scarring. Exposure to ultraviolet light in the first year will darken the scar. It can take 1-2 years for the scar to lose its redness and to heal completely.  SEEK IMMEDIATE MEDICAL CARE IF:  You have redness, pain, or  swelling around the wound.   You see ayellowish-white fluid (pus) coming from the wound.   You have chills or a fever.  MAKE SURE YOU:  Understand these instructions.  Will watch your condition.  Will get help right away if you are not doing well or get worse. Document Released: 06/28/2004 Document Revised: 03/11/2013 Document Reviewed: 01/01/2013 [facility] Patient Information 2015 [place], [place]. This information is not intended to replace advice given to you by your health care provider. Make sure you discuss any questions you have with your health care provider.  Head Injury You have received a head injury. It does not appear serious at this time. Headaches and vomiting are common following head injury. It should be easy to awaken from sleeping. Sometimes it is necessary for you to stay in the emergency department for a while for observation. Sometimes admission to the hospital may be needed. After injuries such as yours, most problems occur within the first 24 hours, but side effects may occur up to 7-10 days after the injury. It is important for you to carefully monitor your condition and contact your health care provider or seek immediate medical care if there is a change in your condition. WHAT ARE THE TYPES OF HEAD INJURIES? Head injuries can be as minor as a bump. Some head injuries can be more severe. More severe head injuries include:  A jarring injury to the brain (concussion).  A bruise of the brain (contusion). This mean there is bleeding in the brain that can cause swelling.  A cracked skull (skull fracture).  Bleeding in the brain that collects, clots, and forms a bump (hematoma). WHAT CAUSES A HEAD INJURY? A serious head injury is most likely to happen to someone who is in a car wreck and is not wearing a seat belt. Other causes of major head injuries include bicycle or motorcycle accidents, sports injuries, and falls. HOW ARE HEAD INJURIES DIAGNOSED? A complete  history of the event leading to the injury and your current symptoms will be helpful in diagnosing head injuries. Many times, pictures of the brain, such as CT or MRI are needed to see the extent of the injury. Often, an overnight hospital stay is necessary for observation.  WHEN SHOULD I SEEK IMMEDIATE MEDICAL CARE?  You should get help right away if:  You have confusion or drowsiness.  You feel sick to your stomach (nauseous) or have continued, forceful vomiting.  You have dizziness or unsteadiness that is getting worse.  You have severe, continued headaches not relieved by medicine. Only take over-the-counter or prescription medicines for pain, fever, or discomfort as directed by your health care provider.  You do not have normal function of the arms or legs or are unable to walk.  You notice changes in the black spots in the center of the colored part of your eye (pupil).  You have a clear  or bloody fluid coming from your nose or ears. °· You have a loss of vision. °During the next 24 hours after the injury, you must stay with someone who can watch you for the warning signs. This person should contact local emergency services (911 in the [place] if you have seizures, you become unconscious, or you are unable to wake up. °HOW CAN I PREVENT A HEAD INJURY IN THE FUTURE? °The most important factor for preventing major head injuries is avoiding motor vehicle accidents.  To minimize the potential for damage to your head, it is crucial to wear seat belts while riding in motor vehicles. Wearing helmets while bike riding and playing collision sports (like football) is also helpful. Also, avoiding dangerous activities around the house will further help reduce your risk of head injury.  °WHEN CAN I RETURN TO NORMAL ACTIVITIES AND ATHLETICS? °You should be reevaluated by your health care provider before returning to these activities. If you have any of the following symptoms, you should not return to  activities or contact sports until 1 week after the symptoms have stopped: °· Persistent headache. °· Dizziness or vertigo. °· Poor attention and concentration. °· Confusion. °· Memory problems. °· Nausea or vomiting. °· Fatigue or tire easily. °· Irritability. °· Intolerant of bright lights or loud noises. °· Anxiety or depression. °· Disturbed sleep. °MAKE SURE YOU:  °· Understand these instructions. °· Will watch your condition. °· Will get help right away if you are not doing well or get worse. °Document Released: 05/21/2005 Document Revised: 05/26/2013 Document Reviewed: 01/26/2013 °Exit[employer]® Patient Information ©2015 [employer]. This information is not intended to replace advice given to you by your health care provider. Make sure you discuss any questions you have with your health care provider. ° °Motor Vehicle Collision °It is common to have multiple bruises and sore muscles after a motor vehicle collision (MVC). These tend to feel worse for the first 24 hours. You may have the most stiffness and soreness over the first several hours. You may also feel worse when you wake up the first morning after your collision. After this point, you will usually begin to improve with each day. The speed of improvement often depends on the severity of the collision, the number of injuries, and the location and nature of these injuries. °HOME CARE INSTRUCTIONS °· Put ice on the injured area. °¨ Put ice in a plastic bag. °¨ Place a towel between your skin and the bag. °¨ Leave the ice on for 15-20 minutes, 3-4 times a day, or as directed by your health care provider. °· Drink enough fluids to keep your urine clear or pale yellow. Do not drink alcohol. °· Take a warm shower or bath once or twice a day. This will increase blood flow to sore muscles. °· You may return to activities as directed by your caregiver. Be careful when lifting, as this may aggravate neck or back pain. °· Only take over-the-counter or prescription  medicines for pain, discomfort, or fever as directed by your caregiver. Do not use aspirin. This may increase bruising and bleeding. °SEEK IMMEDIATE MEDICAL CARE IF: °· You have numbness, tingling, or weakness in the arms or legs. °· You develop severe headaches not relieved with medicine. °· You have severe neck pain, especially tenderness in the middle of the back of your neck. °· You have changes in bowel or bladder control. °· There is increasing pain in any area of the body. °· You have shortness of breath, light-headedness,   or fainting.  You have chest pain.  You feel sick to your stomach (nauseous), throw up (vomit), or sweat.  You have increasing abdominal discomfort.  There is blood in your urine, stool, or vomit.  You have pain in your shoulder (shoulder strap areas).  You feel your symptoms are getting worse. MAKE SURE YOU:  Understand these instructions.  Will watch your condition.  Will get help right away if you are not doing well or get worse. Document Released: 05/21/2005 Document Revised: 10/05/2013 Document Reviewed: 10/18/2010 [facility] Patient Information 2015 [place], [place]. This information is not intended to replace advice given to you by your health care provider. Make sure you discuss any questions you have with your health care provider.
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
## 2014-08-05 ENCOUNTER — Emergency Department (HOSPITAL_COMMUNITY)
Admission: EM | Admit: 2014-08-05 | Discharge: 2014-08-05 | Disposition: A | Discharge status: Home / Self Care | Payer: Self-pay | Attending: Emergency Medicine | Admitting: Emergency Medicine

## 2014-08-05 ENCOUNTER — Encounter (HOSPITAL_COMMUNITY): Payer: Self-pay | Admitting: Emergency Medicine

## 2014-08-05 DIAGNOSIS — F988 Other specified behavioral and emotional disorders with onset usually occurring in childhood and adolescence: Secondary | ICD-10-CM

## 2014-08-05 DIAGNOSIS — F909 Attention-deficit hyperactivity disorder, unspecified type: Secondary | ICD-10-CM | POA: Insufficient documentation

## 2014-08-05 DIAGNOSIS — Z760 Encounter for issue of repeat prescription: Secondary | ICD-10-CM

## 2014-08-05 MED ORDER — AMPHETAMINE-DEXTROAMPHETAMINE 20 MG PO TABS: 20.0000 mg | ORAL_TABLET | Freq: Two times a day (BID) | ORAL | Status: DC

## 2014-08-05 NOTE — Discharge Instructions (Signed)
Take adderall as prescribed.  Only take meds that you are prescribed.   Don't drink or smoke or do other drugs.   See your doctor.   Return to ER if you have withdrawal symptoms, vomiting, fevers, thoughts of harming yourself or others.

## 2014-08-05 NOTE — ED Notes (Signed)
Pt states that he has been on adderall since he was 8.  States that he ran out a week ago and cannot afford to go to the doctor.  States he needs a refill of adderall.  Pt appears to be under the influence (drooping eyes, slurred speech).

## 2014-08-05 NOTE — ED Provider Notes (Signed)
CSN: [id number]     Arrival date & time 08/05/14  1622 History   First MD Initiated Contact with Patient 08/05/14 1725     Chief Complaint  Patient presents with  . Medication Refill     (Consider location/radiation/quality/duration/timing/severity/associated sxs/prior Treatment) The history is provided by the patient.  Alan Hess is a 24 y.o. male hx ADD here requesting medication refill. He is out of his adderall for the last week. He states that he has been feeling more jittery and has withdrawal symptoms so he took his family's Klonopin to make him feel better. He also smokes cigarettes but denies any other drug use. Denies any suicidal or homicidal ideations. He was noted by triage nurse to be under the influence but mother and him denies recent drug or alcohol use.    Past Medical History  Diagnosis Date  . Seasonal allergies   . ADD (attention deficit disorder)    No past surgical history on file. Family History  Problem Relation Age of Onset  . Cancer Other   . Diabetes Other    History  Substance Use Topics  . Smoking status: Never Smoker   . Smokeless tobacco: Current User    Types: Chew, Snuff  . Alcohol Use: No    Review of Systems  Psychiatric/Behavioral: Negative for suicidal ideas and dysphoric mood. The patient is nervous/anxious.   All other systems reviewed and are negative.     Allergies  Iohexol  Home Medications   Prior to Admission medications   Medication Sig Start Date End Date Taking? Authorizing Provider  acetaminophen (TYLENOL) 325 MG tablet Take 650 mg by mouth every 6 (six) hours as needed (pain).    Yes Historical Provider, MD  hydrOXYzine (ATARAX/VISTARIL) 25 MG tablet Take 1 tablet (25 mg total) by mouth at bedtime as needed (sleep). 06/29/14  Yes Rolland PorterMark James, MD  amphetamine-dextroamphetamine (ADDERALL) 20 MG tablet Take 1 tablet (20 mg total) by mouth 2 (two) times daily. 08/05/14   Richardean Canalavid H Eddy Liszewski, MD  HYDROcodone-acetaminophen  (NORCO/VICODIN) 5-325 MG per tablet Take 1 tablet by mouth every 4 (four) hours as needed. 07/30/14   Kristen N Ward, DO  ibuprofen (ADVIL,MOTRIN) 800 MG tablet Take 1 tablet (800 mg total) by mouth every 8 (eight) hours as needed for mild pain. 07/30/14   Kristen N Ward, DO  LORazepam (ATIVAN) 1 MG tablet Take 1 tablet (1 mg total) by mouth daily as needed for anxiety. Patient not taking: Reported on 07/13/2014 06/29/14   Rolland PorterMark James, MD   BP 117/67 mmHg  Pulse 72  Temp(Src) 98.7 F (37.1 C) (Oral)  Resp 18  SpO2 100% Physical Exam  Constitutional: He is oriented to person, place, and time.  Tired, NAD   HENT:  Head: Normocephalic.  Mouth/Throat: Oropharynx is clear and moist.  Eyes: Conjunctivae are normal. Pupils are equal, round, and reactive to light.  Neck: Normal range of motion. Neck supple.  Cardiovascular: Normal rate, regular rhythm and normal heart sounds.   Pulmonary/Chest: Effort normal and breath sounds normal. No respiratory distress. He has no wheezes. He has no rales.  Abdominal: Soft. Bowel sounds are normal. He exhibits no distension. There is no tenderness. There is no rebound.  Musculoskeletal: Normal range of motion. He exhibits no edema.  Neurological: He is alert and oriented to person, place, and time. No cranial nerve deficit. Coordination normal.  Skin: Skin is warm and dry.  Psychiatric: He has a normal mood and affect. His behavior is  normal. Judgment and thought content normal.  Nursing note and vitals reviewed.   ED Course  Procedures (including critical care time) Labs Review Labs Reviewed - No data to display  Imaging Review No results found.   EKG Interpretation None      MDM   Final diagnoses:  ADD (attention deficit disorder)  Medication refill    Alan Hess is a 24 y.o. male here with adderal refill. Appears appropriate, tired from klonopin. Denies suicidal ideations. Social work saw patient. Patient doesn't have insurance  currently. Mother is able to get him a card through [facility]. Has appointment in [place] in a month. I told him that I will give him several weeks supply of adderall and that he can't take other people's medications. He should be gradually tapered off adderall as it is addictive but that needs to be done with primary care doctor.     Richardean Canal, MD 08/05/14 [phone]

## 2014-08-05 NOTE — Progress Notes (Signed)
EDCM went to speak to patient at bedside, but EDP speaking with patient at this time.  Patient noted to be seen in the ED five times in the last six months.  Patient is listed was not having insurance or a pcp. Patient was seen in Jan in the ED with complaints of going through "Adderall withdraw."  Prescription for Adderall was not given at that time.  EDP discussed patient with patient's mother.  EDCM spoke to [name].  Per Rene Kocheregina, patient has been on Addreall since he was 14six years old.  Patient's mother reports patient is unable to pay for his doctor visits. 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  Patient was being seen by [facility]uss [facility]Marybeth [facility] but is unable to afford to see that physician now.  [name] has made an appointment with Dr. Dulcy FannyHusanni in [place] for March 8th or 9th to establish care and hopefully be able to continue Adderall.  [name] reports she will pay for patient's doctor visit.  [name] also reports she [facility] in regards to enrolling patient into insurance offered through the hospital and expects everything to go through without a problem.  EDCM asked patient's mother if the patient spoke to a therapist for ADD?  Patient's mother reports [facility] and plan to make an appointment for the patient.  Patient's mother reports [facility] and also has discount cards for his medication.  EDCM offered patient's mother support.  No further EDCM needs at this time.

## 2014-08-07 ENCOUNTER — Encounter (HOSPITAL_COMMUNITY): Payer: Self-pay

## 2014-08-07 ENCOUNTER — Emergency Department (HOSPITAL_COMMUNITY)
Admission: EM | Admit: 2014-08-07 | Discharge: 2014-08-07 | Disposition: A | Discharge status: Home / Self Care | Payer: Self-pay | Attending: Emergency Medicine | Admitting: Emergency Medicine

## 2014-08-07 ENCOUNTER — Emergency Department (HOSPITAL_COMMUNITY): Payer: Self-pay

## 2014-08-07 DIAGNOSIS — R1084 Generalized abdominal pain: Secondary | ICD-10-CM | POA: Insufficient documentation

## 2014-08-07 DIAGNOSIS — Z8709 Personal history of other diseases of the respiratory system: Secondary | ICD-10-CM | POA: Insufficient documentation

## 2014-08-07 DIAGNOSIS — F909 Attention-deficit hyperactivity disorder, unspecified type: Secondary | ICD-10-CM | POA: Insufficient documentation

## 2014-08-07 DIAGNOSIS — Z79899 Other long term (current) drug therapy: Secondary | ICD-10-CM | POA: Insufficient documentation

## 2014-08-07 DIAGNOSIS — M545 Low back pain, unspecified: Secondary | ICD-10-CM

## 2014-08-07 DIAGNOSIS — T1490XA Injury, unspecified, initial encounter: Secondary | ICD-10-CM

## 2014-08-07 DIAGNOSIS — Y998 Other external cause status: Secondary | ICD-10-CM | POA: Insufficient documentation

## 2014-08-07 DIAGNOSIS — Y92410 Unspecified street and highway as the place of occurrence of the external cause: Secondary | ICD-10-CM | POA: Insufficient documentation

## 2014-08-07 DIAGNOSIS — Y9389 Activity, other specified: Secondary | ICD-10-CM | POA: Insufficient documentation

## 2014-08-07 DIAGNOSIS — R112 Nausea with vomiting, unspecified: Secondary | ICD-10-CM | POA: Insufficient documentation

## 2014-08-07 DIAGNOSIS — S3992XA Unspecified injury of lower back, initial encounter: Secondary | ICD-10-CM | POA: Insufficient documentation

## 2014-08-07 LAB — COMPREHENSIVE METABOLIC PANEL
ALT: 16 U/L (ref 0–53)
ANION GAP: 12 (ref 5–15)
AST: 17 U/L (ref 0–37)
Albumin: 5.2 g/dL (ref 3.5–5.2)
Alkaline Phosphatase: 73 U/L (ref 39–117)
BILIRUBIN TOTAL: 2.3 mg/dL — AB (ref 0.3–1.2)
BUN: 19 mg/dL (ref 6–23)
CO2: 24 mmol/L (ref 19–32)
Calcium: 10.2 mg/dL (ref 8.4–10.5)
Chloride: 106 mmol/L (ref 96–112)
Creatinine, Ser: 1.35 mg/dL (ref 0.50–1.35)
GFR calc non Af Amer: 74 mL/min — ABNORMAL LOW (ref >90)
GFR, EST AFRICAN AMERICAN: 86 mL/min — AB (ref >90)
Glucose, Bld: 125 mg/dL — ABNORMAL HIGH (ref 70–99)
POTASSIUM: 3.6 mmol/L (ref 3.5–5.1)
Sodium: 142 mmol/L (ref 135–145)
Total Protein: 8.2 g/dL (ref 6.0–8.3)

## 2014-08-07 LAB — CBC WITH DIFFERENTIAL/PLATELET
Basophils Absolute: 0 10*3/uL (ref 0.0–0.1)
Basophils Relative: 0 % (ref 0–1)
EOS ABS: 0 10*3/uL (ref 0.0–0.7)
Eosinophils Relative: 0 % (ref 0–5)
HCT: 48 % (ref 39.0–52.0)
Hemoglobin: 17.2 g/dL — ABNORMAL HIGH (ref 13.0–17.0)
LYMPHS PCT: 9 % — AB (ref 12–46)
Lymphs Abs: 1.2 10*3/uL (ref 0.7–4.0)
MCH: 31.3 pg (ref 26.0–34.0)
MCHC: 35.8 g/dL (ref 30.0–36.0)
MCV: 87.3 fL (ref 78.0–100.0)
MONO ABS: 0.6 10*3/uL (ref 0.1–1.0)
Monocytes Relative: 5 % (ref 3–12)
NEUTROS ABS: 11.6 10*3/uL — AB (ref 1.7–7.7)
Neutrophils Relative %: 86 % — ABNORMAL HIGH (ref 43–77)
Platelets: 335 10*3/uL (ref 150–400)
RBC: 5.5 MIL/uL (ref 4.22–5.81)
RDW: 12.5 % (ref 11.5–15.5)
WBC: 13.5 10*3/uL — AB (ref 4.0–10.5)

## 2014-08-07 MED ORDER — ONDANSETRON HCL 4 MG/2ML IJ SOLN
INTRAMUSCULAR | Status: AC
  Administered 2014-08-07: 4 mg
  Filled 2014-08-07: qty 2

## 2014-08-07 MED ORDER — HYDROMORPHONE HCL 1 MG/ML IJ SOLN
INTRAMUSCULAR | Status: AC
  Administered 2014-08-07: 1 mg
  Filled 2014-08-07: qty 1

## 2014-08-07 MED ORDER — SODIUM CHLORIDE 0.9 % IV BOLUS (SEPSIS)
1000.0000 mL | Freq: Once | INTRAVENOUS | Status: AC
  Administered 2014-08-07: 1000 mL via INTRAVENOUS

## 2014-08-07 MED ORDER — ONDANSETRON HCL 4 MG/2ML IJ SOLN
4.0000 mg | Freq: Once | INTRAMUSCULAR | Status: AC
  Administered 2014-08-07: 4 mg via INTRAVENOUS
  Filled 2014-08-07: qty 2

## 2014-08-07 MED ORDER — HYDROCODONE-ACETAMINOPHEN 5-325 MG PO TABS: 1.0000 | ORAL_TABLET | Freq: Four times a day (QID) | ORAL | Status: DC | PRN

## 2014-08-07 MED ORDER — ONDANSETRON 4 MG PO TBDP: ORAL_TABLET | ORAL | Status: DC

## 2014-08-07 MED ORDER — HYDROMORPHONE HCL 1 MG/ML IJ SOLN
1.0000 mg | Freq: Once | INTRAMUSCULAR | Status: AC
  Administered 2014-08-07: 1 mg via INTRAVENOUS
  Filled 2014-08-07: qty 1
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
## 2014-08-07 NOTE — Discharge Instructions (Signed)
Follow up with Dr. Darrick PennaFields to check out your swollen stomach on ct scan.    Follow up with a family md in 1-2 weeks to see how your back is doing
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
## 2014-08-08 ENCOUNTER — Emergency Department (HOSPITAL_COMMUNITY)
Admission: EM | Admit: 2014-08-08 | Discharge: 2014-08-08 | Disposition: A | Discharge status: Home / Self Care | Payer: Self-pay | Attending: Emergency Medicine | Admitting: Emergency Medicine

## 2014-08-08 ENCOUNTER — Encounter (HOSPITAL_COMMUNITY): Payer: Self-pay

## 2014-08-08 DIAGNOSIS — R1013 Epigastric pain: Secondary | ICD-10-CM | POA: Insufficient documentation

## 2014-08-08 DIAGNOSIS — Z79899 Other long term (current) drug therapy: Secondary | ICD-10-CM | POA: Insufficient documentation

## 2014-08-08 DIAGNOSIS — Z8709 Personal history of other diseases of the respiratory system: Secondary | ICD-10-CM | POA: Insufficient documentation

## 2014-08-08 DIAGNOSIS — R197 Diarrhea, unspecified: Secondary | ICD-10-CM | POA: Insufficient documentation

## 2014-08-08 DIAGNOSIS — F909 Attention-deficit hyperactivity disorder, unspecified type: Secondary | ICD-10-CM | POA: Insufficient documentation

## 2014-08-08 DIAGNOSIS — R112 Nausea with vomiting, unspecified: Secondary | ICD-10-CM | POA: Insufficient documentation

## 2014-08-08 DIAGNOSIS — M549 Dorsalgia, unspecified: Secondary | ICD-10-CM | POA: Insufficient documentation

## 2014-08-08 LAB — COMPREHENSIVE METABOLIC PANEL
ALT: 14 U/L (ref 0–53)
AST: 16 U/L (ref 0–37)
Albumin: 4.4 g/dL (ref 3.5–5.2)
Alkaline Phosphatase: 62 U/L (ref 39–117)
Anion gap: 8 (ref 5–15)
BUN: 17 mg/dL (ref 6–23)
CO2: 24 mmol/L (ref 19–32)
CREATININE: 1.1 mg/dL (ref 0.50–1.35)
Calcium: 8.9 mg/dL (ref 8.4–10.5)
Chloride: 110 mmol/L (ref 96–112)
GFR calc Af Amer: >90 mL/min (ref >90)
GFR calc non Af Amer: >90 mL/min (ref >90)
GLUCOSE: 129 mg/dL — AB (ref 70–99)
POTASSIUM: 3.4 mmol/L — AB (ref 3.5–5.1)
SODIUM: 142 mmol/L (ref 135–145)
Total Bilirubin: 2.1 mg/dL — ABNORMAL HIGH (ref 0.3–1.2)
Total Protein: 7 g/dL (ref 6.0–8.3)

## 2014-08-08 LAB — LIPASE, BLOOD: Lipase: 100 U/L — ABNORMAL HIGH (ref 11–59)

## 2014-08-08 MED ORDER — PROMETHAZINE HCL 25 MG RE SUPP: 25.0000 mg | Freq: Four times a day (QID) | RECTAL | Status: DC | PRN

## 2014-08-08 MED ORDER — PROMETHAZINE HCL 25 MG/ML IJ SOLN
25.0000 mg | Freq: Once | INTRAMUSCULAR | Status: AC
  Administered 2014-08-08: 25 mg via INTRAVENOUS
  Filled 2014-08-08: qty 1

## 2014-08-08 MED ORDER — MORPHINE SULFATE 4 MG/ML IJ SOLN
4.0000 mg | Freq: Once | INTRAMUSCULAR | Status: AC
  Administered 2014-08-08: 4 mg via INTRAVENOUS
  Filled 2014-08-08: qty 1

## 2014-08-08 MED ORDER — ONDANSETRON HCL 4 MG PO TABS: 4.0000 mg | ORAL_TABLET | Freq: Three times a day (TID) | ORAL | Status: DC | PRN

## 2014-08-08 MED ORDER — SODIUM CHLORIDE 0.9 % IV BOLUS (SEPSIS)
1000.0000 mL | Freq: Once | INTRAVENOUS | Status: AC
  Administered 2014-08-08: 1000 mL via INTRAVENOUS

## 2014-08-08 NOTE — ED Notes (Signed)
Discharge instructions given, pt mother demonstrated teach back and verbal understanding after EDP came to room for explanation of discharge diagnosis. Pt was A&O x4, walked with steady gait in room, yet pt and mother insisted he have wheel chair due to his "unstable" condition. Pt left with all belongings and prescriptions.
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
## 2014-08-08 NOTE — Discharge Instructions (Signed)
You were seen today for persistent, nausea, vomiting, and abdominal pain. Yesterday he had a CT scan which showed distention of the stomach but was otherwise unremarkable. Her lab work continues to be largely reassuring without evidence of persistent dehydration. He will be given a pack of Zofran at discharge. You were given a prescription for pain medication upon discharge yesterday and can have that filled later today. You need to follow-up with gastroenterology.  It is important for you to maintain hydration with small volumes of clear liquids. If you have any new or worsening complaints, you should be reevaluated.  Nausea and Vomiting Nausea is a sick feeling that often comes before throwing up (vomiting). Vomiting is a reflex where stomach contents come out of your mouth. Vomiting can cause severe loss of body fluids (dehydration). Children and elderly adults can become dehydrated quickly, especially if they also have diarrhea. Nausea and vomiting are symptoms of a condition or disease. It is important to find the cause of your symptoms. CAUSES   Direct irritation of the stomach lining. This irritation can result from increased acid production (gastroesophageal reflux disease), infection, food poisoning, taking certain medicines (such as nonsteroidal anti-inflammatory drugs), alcohol use, or tobacco use.  Signals from the brain.These signals could be caused by a headache, heat exposure, an inner ear disturbance, increased pressure in the brain from injury, infection, a tumor, or a concussion, pain, emotional stimulus, or metabolic problems.  An obstruction in the gastrointestinal tract (bowel obstruction).  Illnesses such as diabetes, hepatitis, gallbladder problems, appendicitis, kidney problems, cancer, sepsis, atypical symptoms of a heart attack, or eating disorders.  Medical treatments such as chemotherapy and radiation.  Receiving medicine that makes you sleep (general anesthetic) during  surgery. DIAGNOSIS Your caregiver may ask for tests to be done if the problems do not improve after a few days. Tests may also be done if symptoms are severe or if the reason for the nausea and vomiting is not clear. Tests may include:  Urine tests.  Blood tests.  Stool tests.  Cultures (to look for evidence of infection).  X-rays or other imaging studies. Test results can help your caregiver make decisions about treatment or the need for additional tests. TREATMENT You need to stay well hydrated. Drink frequently but in small amounts.You may wish to drink water, sports drinks, clear broth, or eat frozen ice pops or gelatin dessert to help stay hydrated.When you eat, eating slowly may help prevent nausea.There are also some antinausea medicines that may help prevent nausea. HOME CARE INSTRUCTIONS   Take all medicine as directed by your caregiver.  If you do not have an appetite, do not force yourself to eat. However, you must continue to drink fluids.  If you have an appetite, eat a normal diet unless your caregiver tells you differently.  Eat a variety of complex carbohydrates (rice, wheat, potatoes, bread), lean meats, yogurt, fruits, and vegetables.  Avoid high-fat foods because they are more difficult to digest.  Drink enough water and fluids to keep your urine clear or pale yellow.  If you are dehydrated, ask your caregiver for specific rehydration instructions. Signs of dehydration may include:  Severe thirst.  Dry lips and mouth.  Dizziness.  Dark urine.  Decreasing urine frequency and amount.  Confusion.  Rapid breathing or pulse. SEEK IMMEDIATE MEDICAL CARE IF:   You have blood or brown flecks (like coffee grounds) in your vomit.  You have black or bloody stools.  You have a severe headache or stiff  neck.  You are confused.  You have severe abdominal pain.  You have chest pain or trouble breathing.  You do not urinate at least once every 8  hours.  You develop cold or clammy skin.  You continue to vomit for longer than 24 to 48 hours.  You have a fever. MAKE SURE YOU:   Understand these instructions.  Will watch your condition.  Will get help right away if you are not doing well or get worse. Document Released: 05/21/2005 Document Revised: 08/13/2011 Document Reviewed: 10/18/2010 [facility] Patient Information 2015 [employer] [place]. This information is not intended to replace advice given to you by your health care provider. Make sure you discuss any questions you have with your health care provider.

## 2014-08-08 NOTE — ED Notes (Signed)
Was here earlier today for the same thing. Has been vomiting since leaving the ER.
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
## 2014-08-11 ENCOUNTER — Telehealth: Payer: Self-pay | Admitting: Gastroenterology

## 2014-08-11 ENCOUNTER — Encounter (HOSPITAL_COMMUNITY): Payer: Self-pay | Admitting: Emergency Medicine

## 2014-08-11 ENCOUNTER — Emergency Department (HOSPITAL_COMMUNITY): Payer: Self-pay

## 2014-08-11 ENCOUNTER — Emergency Department (HOSPITAL_COMMUNITY)
Admission: EM | Admit: 2014-08-11 | Discharge: 2014-08-11 | Disposition: A | Discharge status: Home / Self Care | Payer: Self-pay | Attending: Emergency Medicine | Admitting: Emergency Medicine

## 2014-08-11 DIAGNOSIS — K5900 Constipation, unspecified: Secondary | ICD-10-CM | POA: Insufficient documentation

## 2014-08-11 DIAGNOSIS — R109 Unspecified abdominal pain: Secondary | ICD-10-CM | POA: Insufficient documentation

## 2014-08-11 DIAGNOSIS — Z8659 Personal history of other mental and behavioral disorders: Secondary | ICD-10-CM | POA: Insufficient documentation

## 2014-08-11 DIAGNOSIS — R112 Nausea with vomiting, unspecified: Secondary | ICD-10-CM | POA: Insufficient documentation

## 2014-08-11 DIAGNOSIS — R197 Diarrhea, unspecified: Secondary | ICD-10-CM

## 2014-08-11 DIAGNOSIS — Z79899 Other long term (current) drug therapy: Secondary | ICD-10-CM | POA: Insufficient documentation

## 2014-08-11 DIAGNOSIS — E876 Hypokalemia: Secondary | ICD-10-CM | POA: Insufficient documentation

## 2014-08-11 LAB — COMPREHENSIVE METABOLIC PANEL
ALK PHOS: 67 U/L (ref 39–117)
ALT: 17 U/L (ref 0–53)
AST: 17 U/L (ref 0–37)
Albumin: 4.7 g/dL (ref 3.5–5.2)
Anion gap: 11 (ref 5–15)
BILIRUBIN TOTAL: 2.2 mg/dL — AB (ref 0.3–1.2)
BUN: 10 mg/dL (ref 6–23)
CO2: 23 mmol/L (ref 19–32)
Calcium: 9.3 mg/dL (ref 8.4–10.5)
Chloride: 106 mmol/L (ref 96–112)
Creatinine, Ser: 0.95 mg/dL (ref 0.50–1.35)
GFR calc Af Amer: >90 mL/min (ref >90)
GFR calc non Af Amer: >90 mL/min (ref >90)
Glucose, Bld: 142 mg/dL — ABNORMAL HIGH (ref 70–99)
POTASSIUM: 2.8 mmol/L — AB (ref 3.5–5.1)
Sodium: 140 mmol/L (ref 135–145)
TOTAL PROTEIN: 7.6 g/dL (ref 6.0–8.3)

## 2014-08-11 LAB — URINALYSIS, ROUTINE W REFLEX MICROSCOPIC
Bilirubin Urine: NEGATIVE
Glucose, UA: NEGATIVE mg/dL
Hgb urine dipstick: NEGATIVE
Ketones, ur: 40 mg/dL — AB
LEUKOCYTES UA: NEGATIVE
NITRITE: NEGATIVE
Protein, ur: NEGATIVE mg/dL
SPECIFIC GRAVITY, URINE: 1.01 (ref 1.005–1.030)
Urobilinogen, UA: 0.2 mg/dL (ref 0.0–1.0)
pH: >9 — ABNORMAL HIGH (ref 5.0–8.0)

## 2014-08-11 LAB — CBC WITH DIFFERENTIAL/PLATELET
BASOS ABS: 0 10*3/uL (ref 0.0–0.1)
Basophils Relative: 0 % (ref 0–1)
Eosinophils Absolute: 0.1 10*3/uL (ref 0.0–0.7)
Eosinophils Relative: 1 % (ref 0–5)
HCT: 43.2 % (ref 39.0–52.0)
Hemoglobin: 15.4 g/dL (ref 13.0–17.0)
Lymphocytes Relative: 16 % (ref 12–46)
Lymphs Abs: 1.3 10*3/uL (ref 0.7–4.0)
MCH: 31.1 pg (ref 26.0–34.0)
MCHC: 35.6 g/dL (ref 30.0–36.0)
MCV: 87.3 fL (ref 78.0–100.0)
Monocytes Absolute: 0.5 10*3/uL (ref 0.1–1.0)
Monocytes Relative: 7 % (ref 3–12)
NEUTROS PCT: 76 % (ref 43–77)
Neutro Abs: 6.2 10*3/uL (ref 1.7–7.7)
PLATELETS: 290 10*3/uL (ref 150–400)
RBC: 4.95 MIL/uL (ref 4.22–5.81)
RDW: 12.6 % (ref 11.5–15.5)
WBC: 8.1 10*3/uL (ref 4.0–10.5)

## 2014-08-11 LAB — RAPID URINE DRUG SCREEN, HOSP PERFORMED
Amphetamines: POSITIVE — AB
BARBITURATES: NOT DETECTED
Benzodiazepines: NOT DETECTED
Cocaine: NOT DETECTED
OPIATES: POSITIVE — AB
Tetrahydrocannabinol: NOT DETECTED

## 2014-08-11 LAB — LIPASE, BLOOD: Lipase: 32 U/L (ref 11–59)

## 2014-08-11 MED ORDER — POTASSIUM CHLORIDE CRYS ER 20 MEQ PO TBCR
40.0000 meq | EXTENDED_RELEASE_TABLET | Freq: Once | ORAL | Status: AC
  Administered 2014-08-11: 40 meq via ORAL
  Filled 2014-08-11: qty 2

## 2014-08-11 MED ORDER — METOCLOPRAMIDE HCL 10 MG PO TABS: 10.0000 mg | ORAL_TABLET | Freq: Four times a day (QID) | ORAL | Status: DC | PRN

## 2014-08-11 MED ORDER — SODIUM CHLORIDE 0.9 % IV BOLUS (SEPSIS)
1000.0000 mL | Freq: Once | INTRAVENOUS | Status: AC
  Administered 2014-08-11: 1000 mL via INTRAVENOUS

## 2014-08-11 MED ORDER — POTASSIUM CHLORIDE ER 10 MEQ PO TBCR: 10.0000 meq | EXTENDED_RELEASE_TABLET | Freq: Two times a day (BID) | ORAL | Status: DC

## 2014-08-11 MED ORDER — PROMETHAZINE HCL 25 MG PO TABS: 25.0000 mg | ORAL_TABLET | Freq: Four times a day (QID) | ORAL | Status: DC | PRN

## 2014-08-11 MED ORDER — MORPHINE SULFATE 4 MG/ML IJ SOLN
4.0000 mg | Freq: Once | INTRAMUSCULAR | Status: AC
  Administered 2014-08-11: 4 mg via INTRAVENOUS
  Filled 2014-08-11: qty 1

## 2014-08-11 MED ORDER — DIPHENHYDRAMINE HCL 50 MG/ML IJ SOLN
25.0000 mg | Freq: Once | INTRAMUSCULAR | Status: AC
  Administered 2014-08-11: 25 mg via INTRAVENOUS
  Filled 2014-08-11: qty 1

## 2014-08-11 MED ORDER — ONDANSETRON HCL 4 MG PO TABS: 4.0000 mg | ORAL_TABLET | Freq: Four times a day (QID) | ORAL | Status: DC

## 2014-08-11 MED ORDER — METOCLOPRAMIDE HCL 5 MG/ML IJ SOLN
10.0000 mg | Freq: Once | INTRAMUSCULAR | Status: AC
  Administered 2014-08-11: 10 mg via INTRAVENOUS
  Filled 2014-08-11: qty 2

## 2014-08-11 NOTE — Telephone Encounter (Signed)
APPOINTMENT MADE AND PATIENT AWARE OF DATE AND TIME  °

## 2014-08-11 NOTE — ED Notes (Signed)
Pt has been vomiting for 5 days per family. Pt was seen here 3 days ago for same and has not had relief since.

## 2014-08-11 NOTE — Discharge Instructions (Signed)
I recommend that you take MiraLAX once daily and Colace 100 mg twice daily for constipation. I recommend that you eat a bland diet and eat small amounts more frequently throughout the day rather than 3 large meals. I recommended she start on a liquid diet for the next several days and then slowly advance her diet. Your potassium level was slightly low at 2.8. This will need to be rechecked in the next 1-2 weeks. If you began vomiting again at home and cannot stop despite Zofran or Reglan, please return to the hospital.    Constipation Constipation is when a person has fewer than three bowel movements a week, has difficulty having a bowel movement, or has stools that are dry, hard, or larger than normal. As people grow older, constipation is more common. If you try to fix constipation with medicines that make you have a bowel movement (laxatives), the problem may get worse. Long-term laxative use may cause the muscles of the colon to become weak. A low-fiber diet, not taking in enough fluids, and taking certain medicines may make constipation worse.  CAUSES   Certain medicines, such as antidepressants, pain medicine, iron supplements, antacids, and water pills.   Certain diseases, such as diabetes, irritable bowel syndrome (IBS), thyroid disease, or depression.   Not drinking enough water.   Not eating enough fiber-rich foods.   Stress or travel.   Lack of physical activity or exercise.   Ignoring the urge to have a bowel movement.   Using laxatives too much.  SIGNS AND SYMPTOMS   Having fewer than three bowel movements a week.   Straining to have a bowel movement.   Having stools that are hard, dry, or larger than normal.   Feeling full or bloated.   Pain in the lower abdomen.   Not feeling relief after having a bowel movement.  DIAGNOSIS  Your health care provider will take a medical history and perform a physical exam. Further testing may be done for severe  constipation. Some tests may include:  A barium enema X-ray to examine your rectum, colon, and, sometimes, your small intestine.   A sigmoidoscopy to examine your lower colon.   A colonoscopy to examine your entire colon. TREATMENT  Treatment will depend on the severity of your constipation and what is causing it. Some dietary treatments include drinking more fluids and eating more fiber-rich foods. Lifestyle treatments may include regular exercise. If these diet and lifestyle recommendations do not help, your health care provider may recommend taking over-the-counter laxative medicines to help you have bowel movements. Prescription medicines may be prescribed if over-the-counter medicines do not work.  HOME CARE INSTRUCTIONS   Eat foods that have a lot of fiber, such as fruits, vegetables, whole grains, and beans.  Limit foods high in fat and processed sugars, such as french fries, hamburgers, cookies, candies, and soda.   A fiber supplement may be added to your diet if you cannot get enough fiber from foods.   Drink enough fluids to keep your urine clear or pale yellow.   Exercise regularly or as directed by your health care provider.   Go to the restroom when you have the urge to go. Do not hold it.   Only take over-the-counter or prescription medicines as directed by your health care provider. Do not take other medicines for constipation without talking to your health care provider first.  SEEK IMMEDIATE MEDICAL CARE IF:   You have bright red blood in your stool.  Your constipation lasts for more than 4 days or gets worse.   You have abdominal or rectal pain.   You have thin, pencil-like stools.   You have unexplained weight loss. MAKE SURE YOU:   Understand these instructions.  Will watch your condition.  Will get help right away if you are not doing well or get worse. Document Released: 02/17/2004 Document Revised: 05/26/2013 Document Reviewed:  03/02/2013 [facility] Patient Information 2015 [place], [place]. This information is not intended to replace advice given to you by your health care provider. Make sure you discuss any questions you have with your health care provider.  Nausea and Vomiting Nausea is a sick feeling that often comes before throwing up (vomiting). Vomiting is a reflex where stomach contents come out of your mouth. Vomiting can cause severe loss of body fluids (dehydration). Children and elderly adults can become dehydrated quickly, especially if they also have diarrhea. Nausea and vomiting are symptoms of a condition or disease. It is important to find the cause of your symptoms. CAUSES   Direct irritation of the stomach lining. This irritation can result from increased acid production (gastroesophageal reflux disease), infection, food poisoning, taking certain medicines (such as nonsteroidal anti-inflammatory drugs), alcohol use, or tobacco use.  Signals from the brain.These signals could be caused by a headache, heat exposure, an inner ear disturbance, increased pressure in the brain from injury, infection, a tumor, or a concussion, pain, emotional stimulus, or metabolic problems.  An obstruction in the gastrointestinal tract (bowel obstruction).  Illnesses such as diabetes, hepatitis, gallbladder problems, appendicitis, kidney problems, cancer, sepsis, atypical symptoms of a heart attack, or eating disorders.  Medical treatments such as chemotherapy and radiation.  Receiving medicine that makes you sleep (general anesthetic) during surgery. DIAGNOSIS Your caregiver may ask for tests to be done if the problems do not improve after a few days. Tests may also be done if symptoms are severe or if the reason for the nausea and vomiting is not clear. Tests may include:  Urine tests.  Blood tests.  Stool tests.  Cultures (to look for evidence of infection).  X-rays or other imaging studies. Test results can help  your caregiver make decisions about treatment or the need for additional tests. TREATMENT You need to stay well hydrated. Drink frequently but in small amounts.You may wish to drink water, sports drinks, clear broth, or eat frozen ice pops or gelatin dessert to help stay hydrated.When you eat, eating slowly may help prevent nausea.There are also some antinausea medicines that may help prevent nausea. HOME CARE INSTRUCTIONS   Take all medicine as directed by your caregiver.  If you do not have an appetite, do not force yourself to eat. However, you must continue to drink fluids.  If you have an appetite, eat a normal diet unless your caregiver tells you differently.  Eat a variety of complex carbohydrates (rice, wheat, potatoes, bread), lean meats, yogurt, fruits, and vegetables.  Avoid high-fat foods because they are more difficult to digest.  Drink enough water and fluids to keep your urine clear or pale yellow.  If you are dehydrated, ask your caregiver for specific rehydration instructions. Signs of dehydration may include:  Severe thirst.  Dry lips and mouth.  Dizziness.  Dark urine.  Decreasing urine frequency and amount.  Confusion.  Rapid breathing or pulse. SEEK IMMEDIATE MEDICAL CARE IF:   You have blood or brown flecks (like coffee grounds) in your vomit.  You have black or bloody stools.  You have  a severe headache or stiff neck.  You are confused.  You have severe abdominal pain.  You have chest pain or trouble breathing.  You do not urinate at least once every 8 hours.  You develop cold or clammy skin.  You continue to vomit for longer than 24 to 48 hours.  You have a fever. MAKE SURE YOU:   Understand these instructions.  Will watch your condition.  Will get help right away if you are not doing well or get worse. Document Released: 05/21/2005 Document Revised: 08/13/2011 Document Reviewed: 10/18/2010 [facility] Patient Information 2015  [place], [place]. This information is not intended to replace advice given to you by your health care provider. Make sure you discuss any questions you have with your health care provider.  Hypokalemia Hypokalemia means that the amount of potassium in the blood is lower than normal.Potassium is a chemical, called an electrolyte, that helps regulate the amount of fluid in the body. It also stimulates muscle contraction and helps nerves function properly.Most of the body's potassium is inside of cells, and only a very small amount is in the blood. Because the amount in the blood is so small, minor changes can be life-threatening. CAUSES  Antibiotics.  Diarrhea or vomiting.  Using laxatives too much, which can cause diarrhea.  Chronic kidney disease.  Water pills (diuretics).  Eating disorders (bulimia).  Low magnesium level.  Sweating a lot. SIGNS AND SYMPTOMS  Weakness.  Constipation.  Fatigue.  Muscle cramps.  Mental confusion.  Skipped heartbeats or irregular heartbeat (palpitations).  Tingling or numbness. DIAGNOSIS  Your health care provider can diagnose hypokalemia with blood tests. In addition to checking your potassium level, your health care provider may also check other lab tests. TREATMENT Hypokalemia can be treated with potassium supplements taken by mouth or adjustments in your current medicines. If your potassium level is very low, you may need to get potassium through a vein (IV) and be monitored in the hospital. A diet high in potassium is also helpful. Foods high in potassium are:  Nuts, such as peanuts and pistachios.  Seeds, such as sunflower seeds and pumpkin seeds.  Peas, lentils, and lima beans.  Whole grain and bran cereals and breads.  Fresh fruit and vegetables, such as apricots, avocado, bananas, cantaloupe, kiwi, oranges, tomatoes, asparagus, and potatoes.  Orange and tomato juices.  Red meats.  Fruit yogurt. HOME CARE  INSTRUCTIONS  Take all medicines as prescribed by your health care provider.  Maintain a healthy diet by including nutritious food, such as fruits, vegetables, nuts, whole grains, and lean meats.  If you are taking a laxative, be sure to follow the directions on the label. SEEK MEDICAL CARE IF:  Your weakness gets worse.  You feel your heart pounding or racing.  You are vomiting or having diarrhea.  You are diabetic and having trouble keeping your blood glucose in the normal range. SEEK IMMEDIATE MEDICAL CARE IF:  You have chest pain, shortness of breath, or dizziness.  You are vomiting or having diarrhea for more than 2 days.  You faint. MAKE SURE YOU:   Understand these instructions.  Will watch your condition.  Will get help right away if you are not doing well or get worse. Document Released: 05/21/2005 Document Revised: 03/11/2013 Document Reviewed: 11/21/2012 [facility] Patient Information 2015 [place], [place]. This information is not intended to replace advice given to you by your health care provider. Make sure you discuss any questions you have with your health care provider.

## 2014-08-11 NOTE — Telephone Encounter (Signed)
ER PATIENT, MOTHER CALLED TO MAKE APPOINTMENT, NEW PATIENT.  IS IT OK TO SCHEDULE? PLEASE ADVISE

## 2014-08-11 NOTE — ED Notes (Signed)
Lab at bedside

## 2014-08-11 NOTE — ED Notes (Signed)
Pt ambulated to bathroom with difficulty. Given crackers to eat.

## 2014-08-11 NOTE — ED Provider Notes (Signed)
TIME SEEN: 3:15 AM  CHIEF COMPLAINT: Vomiting, abdominal pain and back pain  HPI: Pt is a 24 y.o. male with history of ADD who presents to the emergency department with persistent vomiting. Was seen in the emergency department 08/07/14 and 08/08/14 for the same. Most of the history is provided by patient's mother who is at bedside. She states that he has had persistent nonbloody, nonbilious vomiting for the past 5 days. He has been complaining of back pain and abdominal soreness with vomiting. On 08/07/14 he had labs that showed a mild leukocytosis of 13,000 and a CT scan which showed a distended stomach with no other acute abnormalities (similar to a CT scan on 07/13/14 - here at that time for urinary retention without vomiting). Was recommended he follow-up with gastroenterology for gastric emptying study. When he came back the next day he was found to have a mildly elevated lipase. Patient's mother reports that then he followed up with Dr. Malvin Johns, general surgeon, who rechecked his lipase and they stated it was improving.  Was placed on BRAT diet and advised by Dr. Malvin Johns as well to see GI.  Mother reports they have not scheduled an appointment with a gastroenterologist as the patient does not have insurance. Mother reports yesterday he began having nonbloody diarrhea. No fever. No history of abdominal surgeries. Patient unable to tolerate any foods or liquids at this time. No sick contacts or recent travel.  No history of prior abdominal surgery.  ROS: See HPI Constitutional: no fever  Eyes: no drainage  ENT: no runny nose   Cardiovascular:  no chest pain  Resp: no SOB  GI: vomiting GU: no dysuria Integumentary: no rash  Allergy: no hives  Musculoskeletal: no leg swelling  Neurological: no slurred speech ROS otherwise negative  PAST MEDICAL HISTORY/PAST SURGICAL HISTORY:  Past Medical History  Diagnosis Date  . Seasonal allergies   . ADD (attention deficit disorder)     MEDICATIONS:   Prior to Admission medications   Medication Sig Start Date End Date Taking? Authorizing Provider  acetaminophen (TYLENOL) 325 MG tablet Take 650 mg by mouth every 6 (six) hours as needed (pain).     Historical Provider, MD  amphetamine-dextroamphetamine (ADDERALL) 20 MG tablet Take 1 tablet (20 mg total) by mouth 2 (two) times daily. 08/05/14   Richardean Canal, MD  HYDROcodone-acetaminophen (NORCO/VICODIN) 5-325 MG per tablet Take 1 tablet by mouth every 6 (six) hours as needed. 08/07/14   Bethann Berkshire, MD  hydrOXYzine (ATARAX/VISTARIL) 25 MG tablet Take 1 tablet (25 mg total) by mouth at bedtime as needed (sleep). Patient not taking: Reported on 08/07/2014 06/29/14   Rolland Porter, MD  ibuprofen (ADVIL,MOTRIN) 800 MG tablet Take 1 tablet (800 mg total) by mouth every 8 (eight) hours as needed for mild pain. 07/30/14   Evania Lyne N Kenyetta Wimbish, DO  ondansetron (ZOFRAN ODT) 4 MG disintegrating tablet  ODT q4 hours prn nausea/vomit 08/07/14   Bethann Berkshire, MD  ondansetron (ZOFRAN) 4 MG tablet Take 1 tablet (4 mg total) by mouth every 8 (eight) hours as needed for nausea or vomiting. 08/08/14   Shon Baton, MD  promethazine (PHENERGAN) 25 MG suppository Place 1 suppository (25 mg total) rectally every 6 (six) hours as needed for nausea or vomiting. 08/08/14   Shon Baton, MD    ALLERGIES:  Allergies  Allergen Reactions  . Iohexol Hives and Itching     Code: HIVES, Desc: became itchy after injection, Onset Date: [id number]  SOCIAL HISTORY:  History  Substance Use Topics  . Smoking status: Never Smoker   . Smokeless tobacco: Current User    Types: Chew, Snuff  . Alcohol Use: No    FAMILY HISTORY: Family History  Problem Relation Age of Onset  . Cancer Other   . Diabetes Other     EXAM: BP 142/88 mmHg  Pulse 78  Temp(Src) 98.1 F (36.7 C) (Oral)  Resp 16  Ht  (1.778 m)  Wt 130 lb (58.968 kg)  BMI 18.65 kg/m2  SpO2 100% CONSTITUTIONAL: Alert and oriented and responds appropriately  to questions. Patient is thin, appears uncomfortable and anxious; moaning HEAD: Normocephalic EYES: Conjunctivae clear, PERRL ENT: normal nose; no rhinorrhea; dry mucous membranes; pharynx without lesions noted NECK: Supple, no meningismus, no LAD  CARD: RRR; S1 and S2 appreciated; no murmurs, no clicks, no rubs, no gallops RESP: Normal chest excursion without splinting or tachypnea; breath sounds clear and equal bilaterally; no wheezes, no rhonchi, no rales ABD/GI: Normal bowel sounds; non-distended; soft, mildly tender to palpation throughout the abdomen without guarding or rebound, no peritoneal signs BACK:  The back appears normal and is non-tender to palpation, there is no CVA tenderness EXT: Normal ROM in all joints; non-tender to palpation; no edema; normal capillary refill; no cyanosis    SKIN: Normal color for age and race; warm NEURO: Moves all extremities equally PSYCH: The patient's mood and manner are appropriate. Grooming and personal hygiene are appropriate.  MEDICAL DECISION MAKING: Patient here with persistent vomiting and diarrhea that started today.  CT scan on 08/07/2014 showed significantly distended stomach that was filled with fluid. He has had a CT scan February 2016 which showed similar appearance of the stomach. His last CT scan prior to that was in 2011 and that was unremarkable.  Was recommended that he have a gastric emptying study. Mother reports that his symptoms have been controlled in the emergency department but that as soon as he goes home he begins vomiting again. She is worried that he is becoming dehydrated.  Patient does appear thin with dry mucous membranes but is hemodynamically stable. He is diffusely mildly tender palpation throughout his abdomen without distention. We'll give IV fluids, Reglan, morphine. We'll repeat abdominal labs, urine. Will obtain acute abdominal series. I do not feel he needs a repeat CT scan at this time.  ED PROGRESS: 4:25 AM  Labs  show mild hypokalemia with potassium of 2.8. He also has mild elevation of his total bilirubin which appears chronic for the patient. Otherwise LFTs and lipase are normal today. Will give oral potassium and by mouth challenge.  He reports feeling much better. Nausea and pain are gone. He has not had any emesis in the emergency department.   5:00 AM  Mother updated with plan. She now appears much more comfortable with the plan for discharge home if patient is able to tolerate by mouth. She now admits that patient ate a sandwich prior to being getting vomiting again at home because he was "so hungry". I have advised him to eat small, more frequent meals rather than 3 large meals and to continue with a liquid diet at this time until he can see a gastroenterologist. His x-ray shows constipation but no sign of free air or bowel obstruction. This may be from being on narcotics. We'll advise him to use Colace and MiraLAX regularly. Discussed with mother that his symptoms may be secondary to a viral gastroenteritis but given his gastric distention he  still needs to follow-up with a gastroenterologist. I did discuss with mother that he appeared to have this gastric distention on previous CT scans and at that time was not vomiting.   6:00 AM  Pt is laughing, smiling. He is able to eat crackers and drink water without difficulty. Has been in the emergency department for 3 hours and has not had any emesis here. He is hemodynamically stable and his abdominal exam is completely benign. I feel he is safe to be discharged home. We'll discharge with prescription for Zofran, Reglan, potassium. Have advised him to use MiraLAX and Colace over-the-counter for constipation. Have urged patient to follow up with gastroenterology and will also give PCP follow-up information. Discussed return precautions. He verbalized understanding and is comfortable with plan.  Layla MawKristen N Kastiel Simonian, DO 08/11/14 [phone]

## 2014-08-11 NOTE — Telephone Encounter (Signed)
Yes

## 2014-08-16 ENCOUNTER — Telehealth: Payer: Self-pay | Admitting: Nurse Practitioner

## 2014-08-16 ENCOUNTER — Ambulatory Visit: Payer: Self-pay | Admitting: Nurse Practitioner

## 2014-08-16 ENCOUNTER — Encounter: Payer: Self-pay | Admitting: Nurse Practitioner

## 2014-08-16 NOTE — Telephone Encounter (Signed)
PATIENT WAS A NO SHOW 08/16/14 AND LETTER WAS SENT  °

## 2014-08-18 MED FILL — Ondansetron HCl Tab 4 MG: ORAL | Qty: 4 | Status: AC

## 2014-08-20 NOTE — Telephone Encounter (Signed)
Noted  

## 2014-10-06 ENCOUNTER — Emergency Department (HOSPITAL_COMMUNITY)
Admission: EM | Admit: 2014-10-06 | Discharge: 2014-10-07 | Disposition: A | Discharge status: Other Acute Inpatient Hospital | Payer: Self-pay | Attending: Emergency Medicine | Admitting: Emergency Medicine

## 2014-10-06 ENCOUNTER — Encounter (HOSPITAL_COMMUNITY): Payer: Self-pay | Admitting: Emergency Medicine

## 2014-10-06 DIAGNOSIS — R45851 Suicidal ideations: Secondary | ICD-10-CM

## 2014-10-06 DIAGNOSIS — Z79899 Other long term (current) drug therapy: Secondary | ICD-10-CM | POA: Insufficient documentation

## 2014-10-06 DIAGNOSIS — F1910 Other psychoactive substance abuse, uncomplicated: Secondary | ICD-10-CM

## 2014-10-06 DIAGNOSIS — F419 Anxiety disorder, unspecified: Secondary | ICD-10-CM | POA: Insufficient documentation

## 2014-10-06 DIAGNOSIS — F909 Attention-deficit hyperactivity disorder, unspecified type: Secondary | ICD-10-CM | POA: Insufficient documentation

## 2014-10-06 DIAGNOSIS — F1310 Sedative, hypnotic or anxiolytic abuse, uncomplicated: Secondary | ICD-10-CM | POA: Insufficient documentation

## 2014-10-06 DIAGNOSIS — F1410 Cocaine abuse, uncomplicated: Secondary | ICD-10-CM | POA: Insufficient documentation

## 2014-10-06 HISTORY — DX: Major depressive disorder, single episode, unspecified: F32.9

## 2014-10-06 HISTORY — DX: Depression, unspecified: F32.A

## 2014-10-06 HISTORY — DX: Anxiety disorder, unspecified: F41.9

## 2014-10-06 LAB — ACETAMINOPHEN LEVEL: Acetaminophen (Tylenol), Serum: <10 ug/mL — ABNORMAL LOW (ref 10–30)

## 2014-10-06 LAB — COMPREHENSIVE METABOLIC PANEL
ALK PHOS: 52 U/L (ref 38–126)
ALT: 14 U/L — AB (ref 17–63)
AST: 17 U/L (ref 15–41)
Albumin: 4.6 g/dL (ref 3.5–5.0)
Anion gap: 8 (ref 5–15)
BUN: 12 mg/dL (ref 6–20)
CO2: 26 mmol/L (ref 22–32)
Calcium: 9.4 mg/dL (ref 8.9–10.3)
Chloride: 105 mmol/L (ref 101–111)
Creatinine, Ser: 1.18 mg/dL (ref 0.61–1.24)
GFR calc non Af Amer: >60 mL/min (ref >60)
GLUCOSE: 83 mg/dL (ref 70–99)
POTASSIUM: 4.1 mmol/L (ref 3.5–5.1)
SODIUM: 139 mmol/L (ref 135–145)
TOTAL PROTEIN: 6.9 g/dL (ref 6.5–8.1)
Total Bilirubin: 2 mg/dL — ABNORMAL HIGH (ref 0.3–1.2)

## 2014-10-06 LAB — RAPID URINE DRUG SCREEN, HOSP PERFORMED
AMPHETAMINES: NOT DETECTED
BARBITURATES: NOT DETECTED
BENZODIAZEPINES: POSITIVE — AB
COCAINE: POSITIVE — AB
Opiates: NOT DETECTED
Tetrahydrocannabinol: NOT DETECTED

## 2014-10-06 LAB — CBC
HCT: 42 % (ref 39.0–52.0)
Hemoglobin: 14.3 g/dL (ref 13.0–17.0)
MCH: 31.2 pg (ref 26.0–34.0)
MCHC: 34 g/dL (ref 30.0–36.0)
MCV: 91.5 fL (ref 78.0–100.0)
PLATELETS: 270 10*3/uL (ref 150–400)
RBC: 4.59 MIL/uL (ref 4.22–5.81)
RDW: 12.9 % (ref 11.5–15.5)
WBC: 9.7 10*3/uL (ref 4.0–10.5)

## 2014-10-06 LAB — ETHANOL: Alcohol, Ethyl (B): <5 mg/dL (ref <5)

## 2014-10-06 LAB — SALICYLATE LEVEL: Salicylate Lvl: <4 mg/dL (ref 2.8–30.0)

## 2014-10-06 MED ORDER — IBUPROFEN 400 MG PO TABS
400.0000 mg | ORAL_TABLET | Freq: Once | ORAL | Status: DC
  Filled 2014-10-06: qty 1

## 2014-10-06 MED ORDER — SODIUM CHLORIDE 0.9 % IV BOLUS (SEPSIS): 1000.0000 mL | Freq: Once | INTRAVENOUS | Status: DC

## 2014-10-06 MED ORDER — LORAZEPAM 1 MG PO TABS
1.0000 mg | ORAL_TABLET | Freq: Once | ORAL | Status: AC
  Administered 2014-10-06: 1 mg via ORAL
  Filled 2014-10-06: qty 1

## 2014-10-06 NOTE — BH Assessment (Signed)
Tele Assessment Note   Alan Hess is a 24 y.o. male who voluntarily presents to [facility] with SI thoughts and requesting adderall medication.  Pt has no specific plan to harm him, but states he he will do "anything I don't get my meds".  Pt denies Hi, states he is seeing dead people because of lack of sleep, stating his grandfather died 1 month ago.  Pt tells this Clinical research associate that he's had 3 past SI attempts by jumping out of car, hanging himself and cutting himself.  Pt says he's been self medicating by taking pain pills(hydrocodone), stating he didn't have the money to visit his family practitioner to obtain a refill.  He says he's been taking 4 pain pills, daily x1 week and last took 2 pills, yesterday.     Axis I: ADHD, combined type and Depressive Disorder NOS Axis II: Deferred Axis III:  Past Medical History  Diagnosis Date  . Seasonal allergies   . ADD (attention deficit disorder)   . Depression   . Anxiety    Axis IV: other psychosocial or environmental problems, problems related to social environment and problems with primary support group Axis V: 31-40 impairment in reality testing  Past Medical History:  Past Medical History  Diagnosis Date  . Seasonal allergies   . ADD (attention deficit disorder)   . Depression   . Anxiety     History reviewed. No pertinent past surgical history.  Family History:  Family History  Problem Relation Age of Onset  . Cancer Other   . Diabetes Other     Social History:  reports that he has never smoked. His smokeless tobacco use includes Chew and Snuff. He reports that he uses illicit drugs (Cocaine). He reports that he does not drink alcohol.  Additional Social History:  Alcohol / Drug Use Pain Medications: See MAR  Prescriptions: See MAR  Over the Counter: See MAR  History of alcohol / drug use?: Yes Longest period of sobriety (when/how long): None --UDS is positive cocaine and states he uses pain pills to help him because he doesnt  have the adderall meds  Negative Consequences of Use: Work / Programmer, multimedia, Copywriter, advertising relationships Withdrawal Symptoms: Other (Comment) (No current w/d sxs ) Substance #1 Name of Substance 1: Pain Pills--Hydrocodone  1 - Age of First Use: Teens  1 - Amount (size/oz): 4 Pills  1 - Frequency: Daily  1 - Duration: On-going  1 - Last Use / Amount: 10/05/14 Substance #2 Name of Substance 2: Cocaine  2 - Age of First Use: Teens  2 - Amount (size/oz): Unk  2 - Frequency: Unk  2 - Duration: On-going  2 - Last Use / Amount: Unk   CIWA: CIWA-Ar BP: 105/66 mmHg Pulse Rate: 101 COWS:    PATIENT STRENGTHS: (choose at least two) Motivation for treatment/growth  Allergies:  Allergies  Allergen Reactions  . Iohexol Hives and Itching     Code: HIVES, Desc: became itchy after injection, Onset Date: [id number]     Home Medications:  (Not in a hospital admission)  OB/GYN Status:  No LMP for male patient.  General Assessment Data Location of Assessment: [facility] T[facility] Assessment: In system Is this a Tele or Face-to-Face Assessment?: Tele Assessment Is this an Initial Assessment or a Re-assessment for this encounter?: Initial Assessment Marital status: Single Maiden name: None  Is patient pregnant?: No Pregnancy Status: No Living Arrangements: Alone Can pt return to current living arrangement?: Yes Admission Status: Voluntary Is patient capable  of signing voluntary admission?: Yes Referral Source: MD Insurance type: SP   Medical Screening Exam [facility] Walk-in ONLY) Medical Exam completed: No Reason for MSE not completed: Other: (None )  Crisis Care Plan Living Arrangements: Alone Name of Psychiatrist: None  Name of Therapist: None   Education Status Is patient currently in school?: No Current Grade: None  Highest grade of school patient has completed: None  Name of school: None  Contact person: None   Risk to self with the past 6 months Suicidal Ideation: Yes-Currently Present Has  patient been a risk to self within the past 6 months prior to admission? : No Suicidal Intent: No Has patient had any suicidal intent within the past 6 months prior to admission? : No Is patient at risk for suicide?: Yes Suicidal Plan?: No Has patient had any suicidal plan within the past 6 months prior to admission? : No Access to Means: Yes Specify Access to Suicidal Means: Sharps, Pills  What has been your use of drugs/alcohol within the last 12 months?: Abusing: Cocaine, Pain Pills(Hydrocodone)  Previous Attempts/Gestures: Yes How many times?: 3 Other Self Harm Risks: None  Triggers for Past Attempts: Unpredictable Intentional Self Injurious Behavior: None Family Suicide History: No Recent stressful life event(s): Other (Comment) (Pt off meds x1 wk; Grandfather died 1 month ago ) Persecutory voices/beliefs?: No Depression: Yes Depression Symptoms: Feeling angry/irritable Substance abuse history and/or treatment for substance abuse?: Yes Suicide prevention information given to non-admitted patients: Not applicable  Risk to Others within the past 6 months Homicidal Ideation: No Does patient have any lifetime risk of violence toward others beyond the six months prior to admission? : No Thoughts of Harm to Others: No Current Homicidal Intent: No Current Homicidal Plan: No Access to Homicidal Means: No Identified Victim: None  History of harm to others?: No Assessment of Violence: None Noted Violent Behavior Description: None  Does patient have access to weapons?: No Criminal Charges Pending?: No Does patient have a court date: No Is patient on probation?: No  Psychosis Hallucinations: Visual (States sees dead people from lack of sleep ) Delusions: None noted  Mental Status Report Appearance/Hygiene: Disheveled Eye Contact: Fair Motor Activity: Unremarkable Speech: Logical/coherent Level of Consciousness: Alert, Irritable Mood: Other (Comment) (Appropriate ) Affect:  Appropriate to circumstance Anxiety Level: Minimal Thought Processes: Coherent, Relevant Judgement: Impaired Orientation: Person, Place, Time, Situation Obsessive Compulsive Thoughts/Behaviors: None  Cognitive Functioning Concentration: Normal Memory: Recent Intact, Remote Intact IQ: Average Insight: Poor Impulse Control: Poor Appetite: Good Weight Loss: 0 Weight Gain: 0 Sleep: Decreased Total Hours of Sleep: 3 Vegetative Symptoms: None  ADLScreening [facility] Assessment Services) Patient's cognitive ability adequate to safely complete daily activities?: Yes Patient able to express need for assistance with ADLs?: Yes Independently performs ADLs?: Yes (appropriate for developmental age)  Prior Inpatient Therapy Prior Inpatient Therapy: Yes Prior Therapy Dates: 2011 [facility]  Reason for Treatment: Depression   Prior Outpatient Therapy Prior Outpatient Therapy: No Prior Therapy Dates: None  Prior Therapy Facilty/Provider(s): None  Reason for Treatment: None  Does patient have an ACCT team?: No Does patient have Intensive In-House Services?  : No Does patient have [facility] services? : No Does patient have P4CC services?: No  ADL Screening (condition at time of admission) Patient's cognitive ability adequate to safely complete daily activities?: Yes Is the patient deaf or have difficulty hearing?: No Does the patient have difficulty seeing, even when wearing glasses/contacts?: No Does the patient have difficulty concentrating, remembering, or making  decisions?: No Patient able to express need for assistance with ADLs?: Yes Does the patient have difficulty dressing or bathing?: No Independently performs ADLs?: Yes (appropriate for developmental age) Does the patient have difficulty walking or climbing stairs?: No Weakness of Legs: None Weakness of Arms/Hands: None  Home Assistive Devices/Equipment Home Assistive Devices/Equipment:  None  Therapy Consults (therapy consults require a physician order) PT Evaluation Needed: No OT Evalulation Needed: No SLP Evaluation Needed: No Abuse/Neglect Assessment (Assessment to be complete while patient is alone) Physical Abuse: Denies Verbal Abuse: Denies Sexual Abuse: Denies Exploitation of patient/patient's resources: Denies Self-Neglect: Denies Values / Beliefs Cultural Requests During Hospitalization: None Spiritual Requests During Hospitalization: None Consults Spiritual Care Consult Needed: No Social Work Consult Needed: No Merchant navy officerAdvance Directives (For Healthcare) Does patient have an advance directive?: No Would patient like information on creating an advanced directive?: No - patient declined information    Additional Information 1:1 In Past 12 Months?: No CIRT Risk: No Elopement Risk: No Does patient have medical clearance?: Yes     Disposition:  Disposition Initial Assessment Completed for this Encounter: Yes Disposition of Patient: Inpatient treatment program, Referred to (Per Donell SievertSpencer Simon, PA meets criteria for inpt admission ) Type of inpatient treatment program: Adult Patient referred to: Other (Comment) (Per Donell SievertSpencer Simon, PA meets criteria for inpt admission )  Murrell ReddenSimmons, Apurva Reily C 10/06/2014 10:18 PM

## 2014-10-06 NOTE — ED Provider Notes (Signed)
CSN: [id number]     Arrival date & time 10/06/14  1709 History   First MD Initiated Contact with Patient 10/06/14 1739     Chief Complaint  Patient presents with  . V70.1     The history is provided by the patient. No language interpreter was used.   [name] presents for evaluation of suicidal ideation. He comes in by police for suicidal thoughts. He states these are because he is out of his Adderall. He has been out of his Adderall for the last week and feels increased anxiety and feels as if he is crawling out of his skin. He denies any current plan. Denies any hallucinations or homicidal ideations. He states that he would like medicine for his anxiety and for his back pain. He states that hydrocodone does not work for his pain. He denies any history of psychiatric illness or prior psychiatric hospitalizations. He states that his mother and family became concerned about his suicidal ideation and they called police because of this. He denies any drug or alcohol use.  Past Medical History  Diagnosis Date  . Seasonal allergies   . ADD (attention deficit disorder)    History reviewed. No pertinent past surgical history. Family History  Problem Relation Age of Onset  . Cancer Other   . Diabetes Other    History  Substance Use Topics  . Smoking status: Never Smoker   . Smokeless tobacco: Current User    Types: Chew, Snuff  . Alcohol Use: No    Review of Systems  All other systems reviewed and are negative.     Allergies  Iohexol  Home Medications   Prior to Admission medications   Medication Sig Start Date End Date Taking? Authorizing Provider  acetaminophen (TYLENOL) 325 MG tablet Take 650 mg by mouth every 6 (six) hours as needed (pain).     Historical Provider, MD  amphetamine-dextroamphetamine (ADDERALL) 20 MG tablet Take 1 tablet (20 mg total) by mouth 2 (two) times daily. 08/05/14   Richardean Canalavid H Yao, MD  HYDROcodone-acetaminophen (NORCO/VICODIN) 5-325 MG per tablet Take 1  tablet by mouth every 6 (six) hours as needed. 08/07/14   Bethann BerkshireJoseph Zammit, MD  hydrOXYzine (ATARAX/VISTARIL) 25 MG tablet Take 1 tablet (25 mg total) by mouth at bedtime as needed (sleep). Patient not taking: Reported on 08/07/2014 06/29/14   Rolland PorterMark James, MD  ibuprofen (ADVIL,MOTRIN) 800 MG tablet Take 1 tablet (800 mg total) by mouth every 8 (eight) hours as needed for mild pain. 07/30/14   Kristen N Ward, DO  metoCLOPramide (REGLAN) 10 MG tablet Take 1 tablet (10 mg total) by mouth every 6 (six) hours as needed for nausea or vomiting. 08/11/14   Kristen N Ward, DO  ondansetron (ZOFRAN ODT) 4 MG disintegrating tablet 4mg  ODT q4 hours prn nausea/vomit 08/07/14   Bethann BerkshireJoseph Zammit, MD  ondansetron (ZOFRAN) 4 MG tablet Take 1 tablet (4 mg total) by mouth every 8 (eight) hours as needed for nausea or vomiting. 08/08/14   Shon Batonourtney F Horton, MD  ondansetron (ZOFRAN) 4 MG tablet Take 1 tablet (4 mg total) by mouth every 6 (six) hours. 08/11/14   Kristen N Ward, DO  potassium chloride (K-DUR) 10 MEQ tablet Take 1 tablet (10 mEq total) by mouth 2 (two) times daily. 08/11/14   Kristen N Ward, DO  promethazine (PHENERGAN) 25 MG suppository Place 1 suppository (25 mg total) rectally every 6 (six) hours as needed for nausea or vomiting. 08/08/14   Shon Batonourtney F Horton, MD  promethazine (PHENERGAN) 25 MG tablet Take 1 tablet (25 mg total) by mouth every 6 (six) hours as needed for nausea or vomiting. 08/11/14   Kristen N Ward, DO   BP 105/66 mmHg  Pulse 101  Resp 16  Ht 5\' 10"  (1.778 m)  Wt 130 lb (58.968 kg)  BMI 18.65 kg/m2  SpO2 99% Physical Exam  Constitutional: He is oriented to person, place, and time. He appears well-developed and well-nourished.  HENT:  Head: Normocephalic and atraumatic.  Cardiovascular: Normal rate and regular rhythm.   No murmur heard. Pulmonary/Chest: Effort normal and breath sounds normal. No respiratory distress.  Abdominal: Soft. There is no tenderness. There is no rebound and no guarding.   Musculoskeletal: He exhibits no edema or tenderness.  Neurological: He is alert and oriented to person, place, and time.  Skin: Skin is warm and dry.  Psychiatric:  Poor eye contact, appears anxious.  Nursing note and vitals reviewed.   ED Course  Procedures (including critical care time) Labs Review Labs Reviewed  ACETAMINOPHEN LEVEL - Abnormal; Notable for the following:    Acetaminophen (Tylenol), Serum <10 (*)    All other components within normal limits  COMPREHENSIVE METABOLIC PANEL - Abnormal; Notable for the following:    ALT 14 (*)    Total Bilirubin 2.0 (*)    All other components within normal limits  URINE RAPID DRUG SCREEN (HOSP PERFORMED) - Abnormal; Notable for the following:    Cocaine POSITIVE (*)    Benzodiazepines POSITIVE (*)    All other components within normal limits  CBC  ETHANOL  SALICYLATE LEVEL    Imaging Review No results found.   EKG Interpretation None      MDM   Final diagnoses:  Drug abuse  Suicidal ideation  Patient here seeking refill for Adderall, states that he is suicidal without this medication. Patient is making multiple requests for narcotic medications as well as anxiolytics in the emergency department. Discussed with patient that he does not need any narcotic medications at this time. Psychiatry evaluation pending at time of dictation. The patient care transferred pending psych eval.    Tilden FossaElizabeth Klay Sobotka, MD 10/06/14 2248

## 2014-10-06 NOTE — ED Notes (Addendum)
Pt reports SI. Pt also reports " i am out of my adderal." pt denies any HI/voices/delusions.nad noted. Pt very anxious in triage.

## 2014-10-06 NOTE — ED Notes (Addendum)
Patient resting comfortably in bed.  Equal rise and fall of chest noted.  [facility]  Accepting is Dr. Jama Flavorsobos.

## 2014-10-06 NOTE — ED Notes (Signed)
Patient given second dinner, telepsych in progress now.

## 2014-10-06 NOTE — ED Notes (Signed)
Pts grandmother contacted per patients request

## 2014-10-06 NOTE — Progress Notes (Addendum)
  [name] to Dr. Jama Flavorsobos, to bed 400-2, RN Juliette AlcideMelinda informed pt can come after 1am.  Alan Abtsatia [name], LCSWA Disposition staff 10/06/2014 11:06 PM

## 2014-10-07 ENCOUNTER — Encounter (HOSPITAL_COMMUNITY): Payer: Self-pay | Admitting: *Deleted

## 2014-10-07 ENCOUNTER — Inpatient Hospital Stay (HOSPITAL_COMMUNITY)
Admission: EM | Admit: 2014-10-07 | Discharge: 2014-10-09 | DRG: 886 | Disposition: A | Discharge status: Home / Self Care | Payer: Self-pay | Source: Intra-hospital | Attending: Psychiatry | Admitting: Psychiatry

## 2014-10-07 DIAGNOSIS — F329 Major depressive disorder, single episode, unspecified: Secondary | ICD-10-CM | POA: Diagnosis present

## 2014-10-07 DIAGNOSIS — R45851 Suicidal ideations: Secondary | ICD-10-CM | POA: Diagnosis present

## 2014-10-07 DIAGNOSIS — Z833 Family history of diabetes mellitus: Secondary | ICD-10-CM

## 2014-10-07 DIAGNOSIS — J302 Other seasonal allergic rhinitis: Secondary | ICD-10-CM

## 2014-10-07 DIAGNOSIS — F4321 Adjustment disorder with depressed mood: Secondary | ICD-10-CM | POA: Diagnosis present

## 2014-10-07 DIAGNOSIS — F4310 Post-traumatic stress disorder, unspecified: Secondary | ICD-10-CM | POA: Diagnosis present

## 2014-10-07 DIAGNOSIS — Z809 Family history of malignant neoplasm, unspecified: Secondary | ICD-10-CM

## 2014-10-07 DIAGNOSIS — F1910 Other psychoactive substance abuse, uncomplicated: Secondary | ICD-10-CM | POA: Diagnosis present

## 2014-10-07 DIAGNOSIS — F410 Panic disorder [episodic paroxysmal anxiety] without agoraphobia: Secondary | ICD-10-CM | POA: Diagnosis present

## 2014-10-07 DIAGNOSIS — G4700 Insomnia, unspecified: Secondary | ICD-10-CM | POA: Diagnosis present

## 2014-10-07 DIAGNOSIS — F909 Attention-deficit hyperactivity disorder, unspecified type: Principal | ICD-10-CM | POA: Diagnosis present

## 2014-10-07 MED ORDER — ACETAMINOPHEN 325 MG PO TABS: 650.0000 mg | ORAL_TABLET | Freq: Four times a day (QID) | ORAL | Status: DC | PRN

## 2014-10-07 MED ORDER — AMPHETAMINE-DEXTROAMPHETAMINE 10 MG PO TABS: 20.0000 mg | ORAL_TABLET | Freq: Two times a day (BID) | ORAL | Status: DC

## 2014-10-07 MED ORDER — TRAZODONE HCL 100 MG PO TABS
100.0000 mg | ORAL_TABLET | Freq: Once | ORAL | Status: AC
  Administered 2014-10-07: 100 mg via ORAL
  Filled 2014-10-07 (×2): qty 1

## 2014-10-07 MED ORDER — TRAZODONE HCL 50 MG PO TABS: 50.0000 mg | ORAL_TABLET | Freq: Every evening | ORAL | Status: DC | PRN

## 2014-10-07 MED ORDER — IBUPROFEN 800 MG PO TABS
800.0000 mg | ORAL_TABLET | Freq: Three times a day (TID) | ORAL | Status: DC | PRN
  Administered 2014-10-08 – 2014-10-09 (×2): 800 mg via ORAL
  Filled 2014-10-07 (×3): qty 1

## 2014-10-07 MED ORDER — ZOLPIDEM TARTRATE 10 MG PO TABS
10.0000 mg | ORAL_TABLET | Freq: Every evening | ORAL | Status: DC | PRN
  Administered 2014-10-08: 10 mg via ORAL
  Filled 2014-10-07: qty 1

## 2014-10-07 MED ORDER — TRAZODONE HCL 50 MG PO TABS
50.0000 mg | ORAL_TABLET | Freq: Every evening | ORAL | Status: DC | PRN
  Administered 2014-10-07: 50 mg via ORAL
  Filled 2014-10-07 (×2): qty 1
  Filled 2014-10-07 (×2): qty 6
  Filled 2014-10-07 (×2): qty 1
  Filled 2014-10-07 (×2): qty 6
  Filled 2014-10-07 (×3): qty 1

## 2014-10-07 MED ORDER — MAGNESIUM HYDROXIDE 400 MG/5ML PO SUSP: 30.0000 mL | Freq: Every day | ORAL | Status: DC | PRN

## 2014-10-07 MED ORDER — AMPHETAMINE-DEXTROAMPHETAMINE 10 MG PO TABS
20.0000 mg | ORAL_TABLET | Freq: Two times a day (BID) | ORAL | Status: DC
  Administered 2014-10-08 – 2014-10-09 (×4): 20 mg via ORAL
  Filled 2014-10-07 (×4): qty 2

## 2014-10-07 MED ORDER — NICOTINE 21 MG/24HR TD PT24
21.0000 mg | MEDICATED_PATCH | Freq: Every day | TRANSDERMAL | Status: DC
  Filled 2014-10-07 (×2): qty 1

## 2014-10-07 MED ORDER — AMPHETAMINE-DEXTROAMPHETAMINE 10 MG PO TABS
10.0000 mg | ORAL_TABLET | Freq: Two times a day (BID) | ORAL | Status: DC
  Administered 2014-10-07: 10 mg via ORAL
  Filled 2014-10-07: qty 1

## 2014-10-07 MED ORDER — TRAZODONE HCL 100 MG PO TABS: 100.0000 mg | ORAL_TABLET | Freq: Every evening | ORAL | Status: DC | PRN

## 2014-10-07 MED ORDER — NICOTINE POLACRILEX 2 MG MT GUM
2.0000 mg | CHEWING_GUM | OROMUCOSAL | Status: DC | PRN
  Administered 2014-10-07 – 2014-10-09 (×8): 2 mg via ORAL
  Filled 2014-10-07 (×6): qty 1

## 2014-10-07 MED ORDER — HYDROXYZINE HCL 50 MG PO TABS
50.0000 mg | ORAL_TABLET | Freq: Four times a day (QID) | ORAL | Status: DC | PRN
  Administered 2014-10-07 – 2014-10-09 (×6): 50 mg via ORAL
  Filled 2014-10-07 (×4): qty 1
  Filled 2014-10-07: qty 10
  Filled 2014-10-07 (×2): qty 1

## 2014-10-07 MED ORDER — BOOST / RESOURCE BREEZE PO LIQD
1.0000 | Freq: Two times a day (BID) | ORAL | Status: DC
Start: 2014-10-07 — End: 2014-10-09
  Administered 2014-10-07 – 2014-10-09 (×4): 1 via ORAL
  Filled 2014-10-07 (×8): qty 1

## 2014-10-07 MED ORDER — AMPHETAMINE-DEXTROAMPHETAMINE 10 MG PO TABS
20.0000 mg | ORAL_TABLET | Freq: Once | ORAL | Status: AC
  Administered 2014-10-07: 20 mg via ORAL
  Filled 2014-10-07: qty 2

## 2014-10-07 MED ORDER — ALUM & MAG HYDROXIDE-SIMETH 200-200-20 MG/5ML PO SUSP: 30.0000 mL | ORAL | Status: DC | PRN

## 2014-10-07 NOTE — [facility] Suicide Risk Assessment (Signed)
[facility]   Nursing information obtained from:  Patient Demographic factors:  Male, Adolescent or young adult, Caucasian, Low socioeconomic status Current Mental Status:  Suicidal ideation indicated by patient, Self-harm thoughts Loss Factors:  Loss of significant relationship, Financial problems / change in socioeconomic status Historical Factors:  Prior suicide attempts, Family history of suicide, Family history of mental illness or substance abuse Risk Reduction Factors:  Employed, Living with another person, especially a relative, Positive social support Total Time spent with patient: 45 minutes Principal Problem: ADHD (attention deficit hyperactivity disorder) Diagnosis:   Patient Active Problem List   Diagnosis Date Noted  . ADHD (attention deficit hyperactivity disorder) [F90.9] 10/07/2014  . PTSD (post-traumatic stress disorder) [F43.10] 10/07/2014  . Substance abuse [F19.10] 10/07/2014  . Seasonal allergies [J30.2]   . ADD (attention deficit disorder) [F90.9]      Continued Clinical Symptoms:  Alcohol Use Disorder Identification Test Final Score (AUDIT): 0 The "Alcohol Use Disorders Identification Test", Guidelines for Use in Primary Care, Second Edition.  World Science writerHealth Organization [facility](WHO). Score between 0-7:  no or low risk or alcohol related problems. Score between 8-15:  moderate risk of alcohol related problems. Score between 16-19:  high risk of alcohol related problems. Score 20 or above:  warrants further diagnostic evaluation for alcohol dependence and treatment.   CLINICAL FACTORS:   Depression:   Comorbid alcohol abuse/dependence Impulsivity Alcohol/Substance Abuse/Dependencies   Psychiatric Specialty Exam: Physical Exam  ROS  Blood pressure 95/55, pulse 62, temperature 97.9 F (36.6 C), temperature source Oral, resp. rate 16, height 5\' 10"  (1.778 m), weight 51.256 kg (113 lb).Body mass index is 16.21 kg/(m^2).   COGNITIVE  FEATURES THAT CONTRIBUTE TO RISK:  Closed-mindedness, Polarized thinking and Thought constriction (tunnel vision)    SUICIDE RISK:   Moderate:  Frequent suicidal ideation with limited intensity, and duration, some specificity in terms of plans, no associated intent, good self-control, limited dysphoria/symptomatology, some risk factors present, and identifiable protective factors, including available and accessible social support.  PLAN OF CARE: Supportive approach/coping skills                              ADHD; resume the Adderall                              Anxiety; work on strategies to deal with the anxiety using CBT/mindfulness                              PTSD; start doing trauma work                              Grief-loss help process the feeling associated to the death of his Grandfather and loss of his GF     Medical Decision Making:  Review of Psycho-Social Stressors (1), Review or order clinical lab tests (1), Review of Medication Regimen & Side Effects (2) and Review of New Medication or Change in Dosage (2)  I certify that inpatient services furnished can reasonably be expected to improve the patient's condition.   Fenna Semel A 10/07/2014, 2:54 PM

## 2014-10-07 NOTE — [facility] Group Notes (Signed)
[facility] LCSW Group Therapy 10/07/2014 1:15 PM Type of Therapy: Group Therapy Participation Level: Active  Participation Quality: Attentive, Sharing and Supportive  Affect: Depressed and Flat  Cognitive: Alert and Oriented  Insight: Developing/Improving and Engaged  Engagement in Therapy: Developing/Improving and Engaged  Modes of Intervention: Activity, Clarification, Confrontation, Discussion, Education, Exploration, Limit-setting, Orientation, Problem-solving, Rapport Building, Reality Testing, Socialization and Support  Summary of Progress/Problems: Patient was attentive and engaged with speaker from [employer]. Patient was attentive to speaker while they shared their story of dealing with mental health and overcoming it. Patient expressed interest in their programs and services and received information on their agency. Patient processed ways they can relate to the speaker.   Tag Wurtz, MSW, LCSWA Clinical Social Worker [facility] [phone]   

## 2014-10-07 NOTE — Progress Notes (Addendum)
NUTRITION ASSESSMENT  Pt identified as at risk on the Malnutrition Screen Tool  INTERVENTION: 1. Educated patient on the importance of nutrition and encouraged intake of food and beverages. 2. Discussed weight goals. 3. Supplements: Resource Breeze po BID, each supplement provides 250 kcal and 9 grams of protein   NUTRITION DIAGNOSIS: Unintentional weight loss related to sub-optimal intake as evidenced by pt report.   Goal: Pt to meet >/= 90% of their estimated nutrition needs.  Monitor:  PO intake  Assessment:  Pt seen for MST. Pt reports many stressors in his life recently. He states he lost his grandfather and girlfriend recently and that he is having a tough time with this. He also states that he was in school and had 2 jobs but had to drop these recently because of being out of medication and unable to handle everything. He states that with this he has had no appetite but that it has improved [facility]. He also states that his UBW is 130 lbs and that he lost all of the weight over the past 2 week; this indicates 17 lb weight loss (13% body weight) in that time frame.  Pt very open to discussion and interested in getting back on his medications and is hopeful that appetite will improve at that time. Discussed the need for protein for muscle mass preservation.  24 y.o. male  Height: Ht Readings from Last 1 Encounters:  10/07/14 5\' 10"  (1.778 m)    Weight: Wt Readings from Last 1 Encounters:  10/07/14 113 lb (51.256 kg)    Weight Hx: Wt Readings from Last 10 Encounters:  10/07/14 113 lb (51.256 kg)  08/11/14 130 lb (58.968 kg)  08/08/14 130 lb (58.968 kg)  08/07/14 130 lb (58.968 kg)  07/30/14 130 lb (58.968 kg)  07/13/14 135 lb (61.236 kg)  06/29/14 130 lb (58.968 kg)  02/05/14 130 lb (58.968 kg)  10/03/13 134 lb (60.782 kg)  09/24/13 127 lb (57.607 kg)    BMI:  Body mass index is 16.21 kg/(m^2). Pt meets criteria for underweight based on current  BMI.  Estimated Nutritional Needs: Kcal: 25-30 kcal/kg Protein: > 1 gram protein/kg Fluid: 1 ml/kcal  Diet Order: Diet regular Room service appropriate?: Yes; Fluid consistency:: Thin Pt is also offered choice of unit snacks mid-morning and mid-afternoon.  Pt is eating as desired.   Lab results and medications reviewed.   Trenton GammonJessica Huie Ghuman, RD, LDN Inpatient Clinical Dietitian Pager # [phone] After hours/weekend pager # [phone]   ADDENDUM: [facility], so changed to Resource Breeze BID
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
## 2014-10-07 NOTE — Tx Team (Signed)
Initial Interdisciplinary Treatment Plan   PATIENT STRESSORS: Educational concerns Financial difficulties Loss of grandfather and breakup with girlfriend Medication change or noncompliance Substance abuse   PATIENT STRENGTHS: Barrister's clerkCommunication skills Motivation for treatment/growth Supportive family/friends   PROBLEM LIST: Problem List/Patient Goals Date to be addressed Date deferred Reason deferred Estimated date of resolution  depression 10/07/14   At d/c  Anxiety "I need my meds" 10/07/14   At d/c  Substance abuse 10/07/14   At d/c  suicidal ideation 10/07/14   At d/c  psychosis 10/07/14   At d/c                           DISCHARGE CRITERIA:  Improved stabilization in mood, thinking, and/or behavior Motivation to continue treatment in a less acute level of care Need for constant or close observation no longer present Verbal commitment to aftercare and medication compliance  PRELIMINARY DISCHARGE PLAN: Outpatient therapy Return to previous living arrangement Return to previous work or school arrangements  PATIENT/FAMIILY INVOLVEMENT: This treatment plan has been presented to and reviewed with the patient, Prudencio BurlySamuel L Husmann.  The patient and family have been given the opportunity to ask questions and make suggestions.  Celene KrasRobinson, Yecenia Dalgleish G 10/07/2014, 2:32 AM

## 2014-10-07 NOTE — ED Notes (Signed)
[facility].  Patient has signed voluntary admission paper.
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
## 2014-10-07 NOTE — Progress Notes (Signed)
D: Patient denies SI/HI and A/V hallucinations; patient reports " I need my Adderall, I get 20 mg twice a day at 6 and 11  A: Monitored q 15 minutes; patient encouraged to attend groups; patient educated about medications; patient given medications per physician orders; patient encouraged to express feelings and/or concerns  R: Patient is preoccupied with a certain medication and is repeatedly asking the same questions; patient is very assertive and demanding; patient is constantly standing at the nurses station with frequent request ; patient was able to set goal to talk with staff 1:1 when having feelings of SI; patient is taking medications as prescribed and tolerating medications

## 2014-10-07 NOTE — Progress Notes (Signed)
[facility] alert, irritable, anxious but cooperative. Pt presented to ED by police, +SI, no plan, +V/hall (seeing dead people), anxious and stating he has been out of his Adderall.  Also, c/o increased anxiety, insomnia and depression. Prior inpatient treatment at age 24 due to suicide attempts (jumping out of car, attempting to hang himself and cutting himself). Denies alcohol or illicit drug use. Admits to using hydrocodone for the "last 3 days" to help with his anxiety. Reported stressors were grandfather death 1 month ago, breakup with girlfriend of 4 years, in community college for [employer] and financial issues (can't afford doctor visits or medication refills). Currently, pt continues to be suicidal, verbally contracts for safety. -HI, -A/Vhall. Emotional support and encouragement given.  Pt admitted for evaluation, stabilization and reduction of baseline. Will monitor closely.

## 2014-10-07 NOTE — Progress Notes (Signed)
Patient states that he had a good day but had a bad phone call that made him upset but he is fine now. His positives for today were that he still had his mother, sister, and nephew in his life.

## 2014-10-07 NOTE — BHH Suicide Risk Assessment (Signed)
BHH INPATIENT:  Family/Significant Other Suicide Prevention Education  Suicide Prevention Education:  Education Completed; Grandmother Alan Hess [phone],  (name of family member/significant other) has been identified by the patient as the family member/significant other with whom the patient will be residing, and identified as the person(s) who will aid the patient in the event of a mental health crisis (suicidal ideations/suicide attempt).  With written consent from the patient, the family member/significant other has been provided the following suicide prevention education, prior to the and/or following the discharge of the patient.  The suicide prevention education provided includes the following:  Suicide risk factors  Suicide prevention and interventions  National Suicide Hotline telephone number  [facility] assessment telephone number  [facility] Emergency Assistance 911  [facility] and/or Residential Mobile Crisis Unit telephone number  Request made of family/significant other to:  Remove weapons (e.g., guns, rifles, knives), all items previously/currently identified as safety concern.    Remove drugs/medications (over-the-counter, prescriptions, illicit drugs), all items previously/currently identified as a safety concern.  The family member/significant other verbalizes understanding of the suicide prevention education information provided.  The family member/significant other agrees to remove the items of safety concern listed above.  Hermen Mario, West CarboKristin L 10/07/2014, 3:49 PM

## 2014-10-08 NOTE — Tx Team (Signed)
Interdisciplinary Treatment Plan Update (Adult)  Date:  10/08/2014 Time Reviewed:  12:48 PM  Progress in Treatment: Attending groups: Yes. Participating in groups:  Yes. Taking medication as prescribed:  Yes. Tolerating medication:  Yes. Family/Significant othe contact made:  Yes, individual(s) contacted:  grandmother. Patient understands diagnosis:  Yes. Discussing patient identified problems/goals with staff:  Yes. Medical problems stabilized or resolved:  Yes. Denies suicidal/homicidal ideation: Yes Issues/concerns per patient self-inventory:  No. Other:  New problem(s) identified: Yes, Describe:  lacks insurance coverage or access to [employer] funds  Discharge Plan or Barriers:  Difficult to arrange follow up except as self pay, patient states he cannot afford his ADD medications.  MD will give short term prescription but patient will need to pay out of pocket.  Reason for Continuation of Hospitalization: Medication stabilization, depression and anxiety  Comments: NA  Estimated length of stay:  3 - 5 days  New goal(s): Patient wants referral for meds provider, lacks insurance, currently ineligible for  IPRS funds, patient and family advised on how to resolve.  CSW will [facility].    Review of initial/current patient goals per problem list:  See initial plan of care   Attendees: Patient:   5/6/201612:48 PM  Family:   5/6/201612:48 PM  Physician:  Dr Dub MikesLugo 5/6/201612:48 PM  Nursing:   Rodell PernaPatrice RN 5/6/201612:48 PM  Case Manager:   5/6/201612:48 PM  Counselor:  Santa GeneraAnne Cunningham, LCSW, Q InglisHodnett, LCSW 5/6/201612:48 PM  Other:  Sondra BargesJ Clark, RN UR 5/6/201612:48 PM  Other:   5/6/201612:48 PM  Other:   5/6/201612:48 PM  Other:  5/6/201612:48 PM  Other:  5/6/201612:48 PM  Other:  5/6/201612:48 PM  Other:  5/6/201612:48 PM  Other:  5/6/201612:48 PM  Other:  5/6/201612:48 PM  Other:   5/6/201612:48 PM   Scribe for Treatment Team:   Sallee Langeunningham, Anne  C, 10/08/2014, 12:48 PM

## 2014-10-08 NOTE — Progress Notes (Signed)
Pt was invited to go to group (AA), but decided to stay in his room instead.

## 2014-10-08 NOTE — [facility] Group Notes (Signed)
[facility] Note   10/08/2014 12:53 PM  Participation Quality:  active  Mood/Affect:  Anxious  Depression Rating:  5  Anxiety Rating:  10  Thoughts of Suicide:  No Will you contract for safety?   NA  Current AVH:  No  Plan for Discharge/Comments:  Return home to family and [place].  Resolve insurance issues.    Transportation Means: Family or [facility] if needed  Supports: Family, plans to access Mental Health Assn peer support, employed  Sallee LangeCunningham, Kaleb Sek C

## 2014-10-08 NOTE — Progress Notes (Signed)
Patient ID: Alan Hess, male   DOB: 1990/10/05, 24 y.o.   MRN: [id number] D)  Was standing at the nursing station waiting for the next shift, has been med seeking and at the med window frequently this evening.  Stated he was ready for trazodone 100 mg by 8 pm, and then went to group, stayed in the dayroom talking to peers, eating snacks, watching tv, then came to med window saying the trazodone didn't work.  Had encouraged him to lie down and let it work, but he didn't feel like it he said.  Stated trazodone wasn't working, was given vistaril, which seemed to help. A)  Will continue to monitor for safety, continue POC R)  Safety maintained.

## 2014-10-08 NOTE — Progress Notes (Signed)
  [facility] Discharge Plan :  Will you be returning to the same living situation after discharge:  Yes,  w family At discharge, do you have transportation home?: Yes,  will have [facility], then family will pick up Do you have the ability to pay for your medications: Yes,  discharging w understanding that he will need to pay for medications until his insurance issues are worked out.  Ineligible for [employer] funds, informed how to address.  Referred to sliding fee scale clinie. or No.  Release of information consent forms completed and in the chart;  Patient's signature needed at discharge.  Patient to Follow up at: Follow-up Information    [facility]/2014.   Why:  Appt on 10/12/14 at 12:00 noon w Shanda BumpsJessica.  Park against building in reserved space for psychology clinic.  Bring copy of pay stubs or W-2 to first appointment. First appointment estimated to cost $15.  Be prepared 1 1/2 hour appointment.   Contact information:   [address] [place]  [id number] Fax:  [phone] Phone: [phone]      Patient denies SI/HI: Yes,  in discharge planning group    Safety Planning and Suicide Prevention discussed: Yes,  reviewed in discharge planning group and completed w grandmother  Have you used any form of tobacco in the last 30 days? (Cigarettes, Smokeless Tobacco, Cigars, and/or Pipes): Yes  Has patient been referred to the Quitline?: Patient refused referral  Sallee LangeCunningham, Anne C 10/08/2014, 4:23 PM
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
## 2014-10-08 NOTE — Progress Notes (Addendum)
D Sam has spent most of the day hanging around the nurses's s and hanging in the dayroom. He is quiet and demonstrates a flat, depressed affect.He demonstrates manipulative behaviors and  Is encouraged to identify and articulate his needs. HE responds well to gentle boundary setting.    A HE completed his morning assessment and on it he wrote he denied  SI but he rated his depression, hopelessness and anxiety " 12/08/08", respectively.   R POC includes potential DC tomorrow. Therapeutic relationship fostered.

## 2014-10-08 NOTE — [facility] Group Notes (Signed)
[facility] LCSW Group Therapy  Feelings Around Relapse 1:15 -2:30                                                               10/08/2014 3:55 PM   Type of Therapy:  Group Therapy  Participation Level:  Appropriate  Participation Quality:  Appropriate  Affect:  Appropriate  Cognitive:  Attentive Appropriate  Insight:  Developing/Improving  Engagement in Therapy: Developing/Improving  Modes of Intervention:  Discussion Exploration Problem-Solving Supportive  Summary of Progress/Problems:  The topic for today was feelings around relapse.  Patient processed feelings toward relapse and was able to relate to peers.                    Patient identified stress of living w family members, unable to make his own decisions, expressed wanting freedom.  States he now realizes that he must earn income in order to pay his own rent to decrease stress in his life and prevent relapse.  Santa GeneraAnne Shaila Gilchrest, LCSW Clinical Social Worker

## 2014-10-08 NOTE — [facility] Group Notes (Signed)
[facility]   10/08/2014 12:45 PM  Participation Quality:  active  Mood/Affect:  Anxious  Depression Rating:  5  Anxiety Rating:  10  Thoughts of Suicide:  No Will you contract for safety?   NA  Current AVH:  No  Plan for Discharge/Comments:  Uninsured but [facility] records indicate that he has BCBS policy perhaps through parents.  Patient and family advised by UR to provide copy of BCBS policy so that they can cancel policy and allow patient to access [facility] resources for aftercare.  Wants referral for ADD medications, free clinics unlikely to provide these options.  Wants to follow up w [facility] for peer support.  CSW will investigate options for [facility]   [employer] Means: family or [place]  Supports: Employed, family  Sallee LangeCunningham, Maymie Brunke C

## 2014-10-09 DIAGNOSIS — F902 Attention-deficit hyperactivity disorder, combined type: Secondary | ICD-10-CM

## 2014-10-09 MED ORDER — HYDROXYZINE HCL 50 MG PO TABS: 50.0000 mg | ORAL_TABLET | Freq: Four times a day (QID) | ORAL | Status: DC | PRN

## 2014-10-09 MED ORDER — AMPHETAMINE-DEXTROAMPHETAMINE 20 MG PO TABS: 20.0000 mg | ORAL_TABLET | Freq: Two times a day (BID) | ORAL | Status: DC

## 2014-10-09 MED ORDER — TRAZODONE HCL 50 MG PO TABS: 50.0000 mg | ORAL_TABLET | Freq: Every evening | ORAL | Status: DC | PRN

## 2014-10-09 NOTE — Progress Notes (Signed)
Patient ID: Alan Hess, male   DOB: 1990-12-31, 24 y.o.   MRN: [id number]  [facility]. Pt was stable at the time of discharge. All papers and prescriptions were given and valuables returned. Verbal understanding expressed. Denies SI/HI and A/VH. Pt given opportunity to express concerns and ask questions.

## 2014-10-09 NOTE — Discharge Summary (Signed)
Physician Discharge Summary Note  Patient:  Alan Hess is an 24 y.o., male MRN:  [id number] DOB:  1991/02/16 Patient phone:  [phone] (home)  Patient address:   [address] [place] [address],  Total Time spent with patient: 30 minutes  Date of Admission:  10/07/2014 Date of Discharge: 10/09/14  Reason for Admission:  Mood stabilization treatments   Principal Problem: ADHD (attention deficit hyperactivity disorder) Discharge Diagnoses: Patient Active Problem List   Diagnosis Date Noted  . ADHD (attention deficit hyperactivity disorder) [F90.9] 10/07/2014  . PTSD (post-traumatic stress disorder) [F43.10] 10/07/2014  . Substance abuse [F19.10] 10/07/2014  . Seasonal allergies [J30.2]   . ADD (attention deficit disorder) [F90.9]    Musculoskeletal: Strength & Muscle Tone: within normal limits Gait & Station: normal Patient leans: N/A  Psychiatric Specialty Exam: Physical Exam  Psychiatric: He has a normal mood and affect. His speech is normal and behavior is normal. Judgment and thought content normal. Cognition and memory are normal.    Review of Systems  Constitutional: Negative.   HENT: Negative.   Eyes: Negative.   Respiratory: Negative.   Cardiovascular: Negative.   Gastrointestinal: Negative.   Genitourinary: Negative.   Musculoskeletal: Negative.   Skin: Negative.   Neurological: Negative.   Endo/Heme/Allergies: Negative.   Psychiatric/Behavioral: Positive for substance abuse (Positive for marijuana on admission ). Negative for depression, suicidal ideas, hallucinations and memory loss. The patient is not nervous/anxious and does not have insomnia.     Blood pressure 95/59, pulse 117, temperature 98.3 F (36.8 C), temperature source Oral, resp. rate 18, height _0  (1.778 m), weight 51.256 kg (113 lb).Body mass index is 16.21 kg/(m^2).  See Physician SRA     Have you used any form of tobacco in the last 30 days? (Cigarettes, Smokeless  Tobacco, Cigars, and/or Pipes): Yes  Has this patient used any form of tobacco in the last 30 days? (Cigarettes, Smokeless Tobacco, Cigars, and/or Pipes) Yes, A prescription for an FDA-approved tobacco cessation medication was offered at discharge and the patient refused  Past Medical History:  Past Medical History  Diagnosis Date  . Seasonal allergies   . ADD (attention deficit disorder)   . Depression   . Anxiety    History reviewed. No pertinent past surgical history. Family History:  Family History  Problem Relation Age of Onset  . Cancer Other   . Diabetes Other    Social History:  History  Alcohol Use No     History  Drug Use  . Yes  . Special: Cocaine    History   Social History  . Marital Status: Single    Spouse Name: N/A  . Number of Children: N/A  . Years of Education: N/A   Social History Main Topics  . Smoking status: Never Smoker   . Smokeless tobacco: Current User    Types: Chew, Snuff  . Alcohol Use: No  . Drug Use: Yes    Special: Cocaine  . Sexual Activity: Yes    Birth Control/ Protection: Condom   Other Topics Concern  . None   Social History Narrative   Risk to Self: Is patient at risk for suicide?: Yes What has been your use of drugs/alcohol within the last 12 months?: Reports that he takes pain pills to help with back pain Risk to Others:   Prior Inpatient Therapy:   Prior Outpatient Therapy:    Level of Care:  OP  Hospital Course:   Alan Hess is a  24 y.o. male who voluntarily presents to [facility] with SI thoughts and requesting adderall medication. Pt has no specific plan to harm him, but states he he will do "anything I don't get my meds". Pt denies Hi, states he is seeing dead people because of lack of sleep, stating his grandfather died 33 month ago. Pt tells this Probation officer that he's had 3 past SI attempts by jumping out of car, hanging himself and cutting himself. Pt says he's been self medicating by taking pain  pills(hydrocodone), stating he didn't have the money to visit his family practitioner to obtain a refill. He says he's been taking 4 pain pills, daily x1 week.          Alan Hess was admitted to the adult 300 unit. He was evaluated and his symptoms were identified. Medication management was discussed and initiated. His Adderall was continued 20 mg twice daily for ADHD. He received nicotine gum for cravings. Patient was also ordered medicine to help with  was oriented to the unit and encouraged to participate in unit programming. Medical problems were identified and treated appropriately. Home medication was restarted as needed.        The patient was evaluated each day by a clinical provider to ascertain the patient's response to treatment.  Improvement was noted by the patient's report of decreasing symptoms, improved sleep and appetite, affect, medication tolerance, behavior, and participation in unit programming.  He was asked each day to complete a self inventory noting mood, mental status, pain, new symptoms, anxiety and concerns. Patient appeared motivated to regain control of his life. He reported having a second job lined up after discharge. He reported that Adderall helped him function better and denied abusing it. The patient appeared very future oriented and talked about going back to school. Notes from nursing indicate that the patient could be demanding regarding the scheduled times to receive his Adderall.          He responded well to medication and being in a therapeutic and supportive environment. Positive and appropriate behavior was noted and the patient was motivated for recovery.  The patient worked closely with the treatment team and case manager to develop a discharge plan with appropriate goals. Coping skills, problem solving as well as relaxation therapies were also part of the unit programming.         By the day of discharge he was in much improved condition than upon  admission.  Symptoms were reported as significantly decreased or resolved completely. The patient denied SI/HI and voiced no AVH. He was motivated to continue taking medication with a goal of continued improvement in mental health.  Alan Hess was discharged home with a plan to follow up as noted below. The patient was provided with sample medications and prescriptions at time of discharge. He left [facility] in stable condition with all belongings returned to him.   Consults:  None  Significant Diagnostic Studies:  Chemistry panel, CBC, UDS positive for benzo/marijuana,   Discharge Vitals:   Blood pressure 95/59, pulse 117, temperature 98.3 F (36.8 C), temperature source Oral, resp. rate 18, height _0  (1.778 m), weight 51.256 kg (113 lb). Body mass index is 16.21 kg/(m^2). Lab Results:   Results for orders placed or performed during the hospital encounter of 10/06/14 (from the past 72 hour(s))  Acetaminophen level     Status: Abnormal   Collection Time: 10/06/14  6:06 PM  Result Value Ref Range   Acetaminophen (Tylenol), Serum <  10 (L) 10 - 30 ug/mL    Comment:        THERAPEUTIC CONCENTRATIONS VARY SIGNIFICANTLY. A RANGE OF 10-30 ug/mL MAY BE AN EFFECTIVE CONCENTRATION FOR MANY PATIENTS. HOWEVER, SOME ARE BEST TREATED AT CONCENTRATIONS OUTSIDE THIS RANGE. ACETAMINOPHEN CONCENTRATIONS >150 ug/mL AT 4 HOURS AFTER INGESTION AND >50 ug/mL AT 12 HOURS AFTER INGESTION ARE OFTEN ASSOCIATED WITH TOXIC REACTIONS.   CBC     Status: None   Collection Time: 10/06/14  6:06 PM  Result Value Ref Range   WBC 9.7 4.0 - 10.5 K/uL   RBC 4.59 4.22 - 5.81 MIL/uL   Hemoglobin 14.3 13.0 - 17.0 g/dL   HCT 42.0 39.0 - 52.0 %   MCV 91.5 78.0 - 100.0 fL   MCH 31.2 26.0 - 34.0 pg   MCHC 34.0 30.0 - 36.0 g/dL   RDW 12.9 11.5 - 15.5 %   Platelets 270 150 - 400 K/uL  Comprehensive metabolic panel     Status: Abnormal   Collection Time: 10/06/14  6:06 PM  Result Value Ref Range   Sodium 139 135 -  145 mmol/L   Potassium 4.1 3.5 - 5.1 mmol/L   Chloride 105 101 - 111 mmol/L   CO2 26 22 - 32 mmol/L   Glucose, Bld 83 70 - 99 mg/dL   BUN 12 6 - 20 mg/dL   Creatinine, Ser 1.18 0.61 - 1.24 mg/dL   Calcium 9.4 8.9 - 10.3 mg/dL   Total Protein 6.9 6.5 - 8.1 g/dL   Albumin 4.6 3.5 - 5.0 g/dL   AST 17 15 - 41 U/L   ALT 14 (L) 17 - 63 U/L   Alkaline Phosphatase 52 38 - 126 U/L   Total Bilirubin 2.0 (H) 0.3 - 1.2 mg/dL   GFR calc non Af Amer >60 >60 mL/min   GFR calc Af Amer >60 >60 mL/min    Comment: (NOTE) The eGFR has been calculated using the CKD EPI equation. This calculation has not been validated in all clinical situations. eGFR's persistently <90 mL/min signify possible Chronic Kidney Disease.    Anion gap 8 5 - 15  Ethanol (ETOH)     Status: None   Collection Time: 10/06/14  6:06 PM  Result Value Ref Range   Alcohol, Ethyl (B) <5 <5 mg/dL    Comment:        LOWEST DETECTABLE LIMIT FOR SERUM ALCOHOL IS 11 mg/dL FOR MEDICAL PURPOSES ONLY   Salicylate level     Status: None   Collection Time: 10/06/14  6:06 PM  Result Value Ref Range   Salicylate Lvl <2.7 2.8 - 30.0 mg/dL  Urine Drug Screen     Status: Abnormal   Collection Time: 10/06/14  9:13 PM  Result Value Ref Range   Opiates NONE DETECTED NONE DETECTED   Cocaine POSITIVE (A) NONE DETECTED   Benzodiazepines POSITIVE (A) NONE DETECTED   Amphetamines NONE DETECTED NONE DETECTED   Tetrahydrocannabinol NONE DETECTED NONE DETECTED   Barbiturates NONE DETECTED NONE DETECTED    Comment:        DRUG SCREEN FOR MEDICAL PURPOSES ONLY.  IF CONFIRMATION IS NEEDED FOR ANY PURPOSE, NOTIFY LAB WITHIN 5 DAYS.        LOWEST DETECTABLE LIMITS FOR URINE DRUG SCREEN Drug Class       Cutoff (ng/mL) Amphetamine      1000 Barbiturate      200 Benzodiazepine   741 Tricyclics       287 Opiates  300 Cocaine          300 THC              50     Physical Findings: AIMS: Facial and Oral Movements Muscles of Facial  Expression: None, normal Lips and Perioral Area: None, normal Jaw: None, normal Tongue: None, normal,Extremity Movements Upper (arms, wrists, hands, fingers): None, normal Lower (legs, knees, ankles, toes): None, normal, Trunk Movements Neck, shoulders, hips: None, normal, Overall Severity Severity of abnormal movements (highest score from questions above): None, normal Incapacitation due to abnormal movements: None, normal Patient's awareness of abnormal movements (rate only patient's report): No Awareness, Dental Status Current problems with teeth and/or dentures?: No Does patient usually wear dentures?: No  CIWA:    COWS:      See Psychiatric Specialty Exam and Suicide Risk Assessment completed by Attending Physician prior to discharge.  Discharge destination:  Home  Is patient on multiple antipsychotic therapies at discharge:  No   Has Patient had three or more failed trials of antipsychotic monotherapy by history:  No  Recommended Plan for Multiple Antipsychotic Therapies: NA     Medication List    STOP taking these medications        HYDROcodone-acetaminophen 5-325 MG per tablet  Commonly known as:  NORCO/VICODIN     metoCLOPramide 10 MG tablet  Commonly known as:  REGLAN     ondansetron 4 MG disintegrating tablet  Commonly known as:  ZOFRAN ODT     ondansetron 4 MG tablet  Commonly known as:  ZOFRAN     potassium chloride 10 MEQ tablet  Commonly known as:  K-DUR     promethazine 25 MG suppository  Commonly known as:  PHENERGAN     promethazine 25 MG tablet  Commonly known as:  PHENERGAN      TAKE these medications      Indication   amphetamine-dextroamphetamine 20 MG tablet  Commonly known as:  ADDERALL  Take 1 tablet (20 mg total) by mouth 2 (two) times daily. For attention   Indication:  Attention Deficit Disorder     hydrOXYzine 50 MG tablet  Commonly known as:  ATARAX/VISTARIL  Take 1 tablet (50 mg total) by mouth every 6 (six) hours as needed  for anxiety (sleep).   Indication:  Anxiety Neurosis     ibuprofen 800 MG tablet  Commonly known as:  ADVIL,MOTRIN  Take 1 tablet (800 mg total) by mouth every 8 (eight) hours as needed for mild pain.      traZODone 50 MG tablet  Commonly known as:  DESYREL  Take 1 tablet (50 mg total) by mouth at bedtime and may repeat dose one time if needed.   Indication:  Trouble Sleeping           Follow-up Information    Follow up with [facility] On 10/12/2014.   Why:  Appt on 10/12/14 at 12:00 noon w Janett Billow.  Park against building in reserved space for psychology clinic.  Bring copy of pay stubs or W-2 to first appointment. First appointment estimated to cost $15.  Be prepared 1 1/2 hour appointment.   Contact information:   [place],   [address] Fax:  [phone] Phone: [phone]      Follow-up recommendations:    Activity: as tolerated Diet: Regular Tests: NA Other: see below  Comments:   Take all your medications as prescribed by your mental healthcare provider.  Report any adverse effects and or reactions  from your medicines to your outpatient provider promptly.  Patient is instructed and cautioned to not engage in alcohol and or illegal drug use while on prescription medicines.  In the event of worsening symptoms, patient is instructed to call the crisis hotline, 911 and or go to the nearest ED for appropriate evaluation and treatment of symptoms.  Follow-up with your primary care provider for your other medical issues, concerns and or health care needs.   Total Discharge Time: Greater than 30 minutes  Signed: DAVIS, LAURA NP-C 10/09/2014, 9:11 AM   Patient seen, Suicide Assessment Completed.  Disposition Plan Reviewed

## 2014-10-09 NOTE — Progress Notes (Addendum)
Patient ID: Alan Hess, male   DOB: 1990/06/25, 24 y.o.   MRN: [id number]  Pt currently presents with a anxious affect and behavior. Per self inventory, pt rates depression at a 7, hopelessness 7 and anxiety 8. Pt's daily goal is "having a good day before I leave" and they intend to do so by "talk." Pt reports poor sleep, poor concentration and a fair appetite.   Pt provided with medications per providers orders. Pt's labs and vitals were monitored throughout the day. Pt supported emotionally and encouraged to express concerns and questions. Pt educated on medications and substance abuse.  Pt's safety ensured with 15 minute and environmental checks. Pt currently denies SI/HI and A/V hallucinations. Pt verbally agrees to seek staff if SI/HI or A/VH occurs and to consult with staff before acting on these thoughts. Pt states "I want to do a mentorship when I leave here, I'm thinking about asking that guy Onalee Huaavid for help. I also want to get back into school, I think that will help."

## 2014-10-09 NOTE — [facility] Suicide Risk Assessment (Addendum)
[facility]   Demographic Factors:  24 year old man, single, no children, lives with grandmother.  Total Time spent with patient: 30 minutes  Musculoskeletal: Strength & Muscle Tone: within normal limits Gait & Station: normal Patient leans: N/A  Psychiatric Specialty Exam: Physical Exam  ROS  Blood pressure 95/59, pulse 117, temperature 98.3 F (36.8 C), temperature source Oral, resp. rate 18, height 5\' 10"  (1.778 m), weight 113 lb (51.256 kg).Body mass index is 16.21 kg/(m^2).  General Appearance: improved grooming   Eye Contact::  Good  Speech:  Normal Rate409  Volume:  Normal  Mood:  improved mood - states it " is normal"  Affect:  Appropriate and more reactive   Thought Process:  Goal Directed and Linear  Orientation:  Full (Time, Place, and Person)  Thought Content:  denies hallucinations, no delusions  Suicidal Thoughts:  No-  Denies any suicidal or homicidal ideations.   Homicidal Thoughts:  No  Memory:  recent and remote grossly intact   Judgement:  Other:  improved   Insight:  Present  Psychomotor Activity:  Normal  Concentration:  Fair  Recall:  Good  Fund of Knowledge:Good  Language: Good  Akathisia:  Negative  Handed:  Right  AIMS (if indicated):     Assets:  Desire for Improvement Housing Physical Health Resilience  Sleep:  Number of Hours: 6.75  Cognition: WNL  ADL's: improved    Have you used any form of tobacco in the last 30 days? (Cigarettes, Smokeless Tobacco, Cigars, and/or Pipes): Yes  Has this patient used any form of tobacco in the last 30 days? (Cigarettes, Smokeless Tobacco, Cigars, and/or Pipes) Yes, A prescription for an FDA-approved tobacco cessation medication was offered at discharge and the patient refused  Mental Status Per Nursing Assessment::   On Admission:  Suicidal ideation indicated by patient, Self-harm thoughts  Current Mental Status by Physician: * Of note, treatment team report is that patient  is scheduled for discharge today - disposition plan as below. Patient is agreeing with discharge and states " I feel better and ready to go". At this time patient is improved, seems euthymic, denies depression, affect appropriate, no thought disorder, no SI or HI, no psychotic symptoms, future oriented, wanting to return to work soon. States " Once I am back working I know I will be all right".  Denies medication side effects  Loss Factors: Recent death of grandfather, recent breakup with GF,   Historical Factors: History of Depression, History of ADHD.  One prior psychiatric admission at age 24.  Risk Reduction Factors:   Religious beliefs about death, Living with another person, especially a relative, Positive coping skills or problem solving skills and states he is very involved in his church  Continued Clinical Symptoms:  As noted, at this time reports feeling better, denies depression, affect is appropriate and behavior is calm and in good control, no current SI/HI.   Cognitive Features That Contribute To Risk:  No gross cognitive deficits noted upon discharge. Is alert , attentive, and oriented x 3    Suicide Risk:  Mild:  Suicidal ideation of limited frequency, intensity, duration, and specificity.  There are no identifiable plans, no associated intent, mild dysphoria and related symptoms, good self-control (both objective and subjective assessment), few other risk factors, and identifiable protective factors, including available and accessible social support.  Principal Problem: ADHD (attention deficit hyperactivity disorder) Discharge Diagnoses:  Patient Active Problem List   Diagnosis Date Noted  . ADHD (  attention deficit hyperactivity disorder) [F90.9] 10/07/2014  . PTSD (post-traumatic stress disorder) [F43.10] 10/07/2014  . Substance abuse [F19.10] 10/07/2014  . Seasonal allergies [J30.2]   . ADD (attention deficit disorder) [F90.9]     Follow-up Information    [facility].   Why:  Appt on 10/12/14 at 12:00 noon w Shanda BumpsJessica.  Park against building in reserved space for psychology clinic.  Bring copy of pay stubs or W-2 to first appointment. First appointment estimated to cost $15.  Be prepared 1 1/2 hour appointment.   Contact information:   [address] [place], [place]  [id number] Fax:  [phone] Phone: [phone]      Plan Of Care/Follow-up recommendations:  Activity:  as tolerated Diet:  Regular Tests:  NA Other:  see below  Is patient on multiple antipsychotic therapies at discharge:  No   Has Patient had three or more failed trials of antipsychotic monotherapy by history:  No  Recommended Plan for Multiple Antipsychotic Therapies: NA   Patient is leaving in good spirits. Plans to live with grandmother. Follow up as above .     COBOS, FERNANDO 10/09/2014, 10:59 AM

## 2014-10-09 NOTE — Progress Notes (Addendum)
Adult Psychoeducational Group Note  Date:  10/09/2014 Time: 09:15am  Group Topic/Focus:  Orientation:   The focus of this group is to educate the patient on the purpose and policies of crisis stabilization and provide a format to answer questions about their admission.  The group details unit policies and expectations of patients while admitted.  Participation Level:  Active  Participation Quality:  Inattentive and Redirectable  Affect:  Anxious  Cognitive:  Alert  Insight: Improving  Engagement in Group:  Improving  Modes of Intervention:  Discussion, Education, Orientation and Support  Additional Comments:  Pt able to identify his current medical needs and advocate for his care.   Aurora Maskwyman, Tyiana Hill E 10/09/2014, 12:35 PM

## 2014-12-06 ENCOUNTER — Inpatient Hospital Stay (HOSPITAL_COMMUNITY)
Admission: EM | Admit: 2014-12-06 | Discharge: 2014-12-13 | DRG: 897 | Disposition: A | Discharge status: Home / Self Care | Payer: No Typology Code available for payment source | Source: Intra-hospital | Attending: Emergency Medicine | Admitting: Emergency Medicine

## 2014-12-06 ENCOUNTER — Encounter (HOSPITAL_COMMUNITY): Payer: Self-pay | Admitting: Emergency Medicine

## 2014-12-06 ENCOUNTER — Emergency Department (HOSPITAL_COMMUNITY)
Admission: EM | Admit: 2014-12-06 | Discharge: 2014-12-06 | Disposition: A | Discharge status: Other Acute Inpatient Hospital | Payer: Self-pay | Attending: Emergency Medicine | Admitting: Emergency Medicine

## 2014-12-06 DIAGNOSIS — F419 Anxiety disorder, unspecified: Secondary | ICD-10-CM | POA: Insufficient documentation

## 2014-12-06 DIAGNOSIS — F1593 Other stimulant use, unspecified with withdrawal: Secondary | ICD-10-CM | POA: Diagnosis not present

## 2014-12-06 DIAGNOSIS — J302 Other seasonal allergic rhinitis: Secondary | ICD-10-CM | POA: Diagnosis present

## 2014-12-06 DIAGNOSIS — F1110 Opioid abuse, uncomplicated: Secondary | ICD-10-CM | POA: Insufficient documentation

## 2014-12-06 DIAGNOSIS — F1523 Other stimulant dependence with withdrawal: Secondary | ICD-10-CM | POA: Diagnosis present

## 2014-12-06 DIAGNOSIS — F11188 Opioid abuse with other opioid-induced disorder: Secondary | ICD-10-CM | POA: Diagnosis present

## 2014-12-06 DIAGNOSIS — F4310 Post-traumatic stress disorder, unspecified: Secondary | ICD-10-CM | POA: Diagnosis present

## 2014-12-06 DIAGNOSIS — R1110 Vomiting, unspecified: Secondary | ICD-10-CM | POA: Diagnosis present

## 2014-12-06 DIAGNOSIS — F988 Other specified behavioral and emotional disorders with onset usually occurring in childhood and adolescence: Secondary | ICD-10-CM | POA: Diagnosis present

## 2014-12-06 DIAGNOSIS — F439 Reaction to severe stress, unspecified: Secondary | ICD-10-CM | POA: Diagnosis not present

## 2014-12-06 DIAGNOSIS — M549 Dorsalgia, unspecified: Secondary | ICD-10-CM | POA: Diagnosis present

## 2014-12-06 DIAGNOSIS — F902 Attention-deficit hyperactivity disorder, combined type: Secondary | ICD-10-CM | POA: Insufficient documentation

## 2014-12-06 DIAGNOSIS — F909 Attention-deficit hyperactivity disorder, unspecified type: Secondary | ICD-10-CM | POA: Diagnosis present

## 2014-12-06 DIAGNOSIS — R45851 Suicidal ideations: Secondary | ICD-10-CM | POA: Diagnosis present

## 2014-12-06 DIAGNOSIS — F410 Panic disorder [episodic paroxysmal anxiety] without agoraphobia: Secondary | ICD-10-CM | POA: Diagnosis present

## 2014-12-06 DIAGNOSIS — F19930 Other psychoactive substance use, unspecified with withdrawal, uncomplicated: Secondary | ICD-10-CM | POA: Diagnosis not present

## 2014-12-06 DIAGNOSIS — F329 Major depressive disorder, single episode, unspecified: Secondary | ICD-10-CM | POA: Insufficient documentation

## 2014-12-06 DIAGNOSIS — R509 Fever, unspecified: Secondary | ICD-10-CM | POA: Diagnosis present

## 2014-12-06 DIAGNOSIS — Z681 Body mass index (BMI) 19 or less, adult: Secondary | ICD-10-CM | POA: Diagnosis not present

## 2014-12-06 DIAGNOSIS — F1190 Opioid use, unspecified, uncomplicated: Secondary | ICD-10-CM | POA: Insufficient documentation

## 2014-12-06 DIAGNOSIS — Z833 Family history of diabetes mellitus: Secondary | ICD-10-CM | POA: Diagnosis not present

## 2014-12-06 DIAGNOSIS — F430 Acute stress reaction: Secondary | ICD-10-CM | POA: Diagnosis present

## 2014-12-06 DIAGNOSIS — F17200 Nicotine dependence, unspecified, uncomplicated: Secondary | ICD-10-CM | POA: Diagnosis present

## 2014-12-06 DIAGNOSIS — R634 Abnormal weight loss: Secondary | ICD-10-CM | POA: Diagnosis present

## 2014-12-06 DIAGNOSIS — Z79899 Other long term (current) drug therapy: Secondary | ICD-10-CM | POA: Insufficient documentation

## 2014-12-06 LAB — URINALYSIS, ROUTINE W REFLEX MICROSCOPIC
Bilirubin Urine: NEGATIVE
GLUCOSE, UA: NEGATIVE mg/dL
HGB URINE DIPSTICK: NEGATIVE
Ketones, ur: NEGATIVE mg/dL
Leukocytes, UA: NEGATIVE
Nitrite: NEGATIVE
Protein, ur: NEGATIVE mg/dL
SPECIFIC GRAVITY, URINE: 1.02 (ref 1.005–1.030)
Urobilinogen, UA: 0.2 mg/dL (ref 0.0–1.0)
pH: 6 (ref 5.0–8.0)

## 2014-12-06 LAB — CBC WITH DIFFERENTIAL/PLATELET
Basophils Absolute: 0 10*3/uL (ref 0.0–0.1)
Basophils Relative: 1 % (ref 0–1)
EOS ABS: 0.2 10*3/uL (ref 0.0–0.7)
Eosinophils Relative: 3 % (ref 0–5)
HCT: 42.7 % (ref 39.0–52.0)
Hemoglobin: 14.7 g/dL (ref 13.0–17.0)
Lymphocytes Relative: 30 % (ref 12–46)
Lymphs Abs: 1.6 10*3/uL (ref 0.7–4.0)
MCH: 31.1 pg (ref 26.0–34.0)
MCHC: 34.4 g/dL (ref 30.0–36.0)
MCV: 90.3 fL (ref 78.0–100.0)
MONO ABS: 0.3 10*3/uL (ref 0.1–1.0)
Monocytes Relative: 6 % (ref 3–12)
Neutro Abs: 3.2 10*3/uL (ref 1.7–7.7)
Neutrophils Relative %: 61 % (ref 43–77)
Platelets: 280 10*3/uL (ref 150–400)
RBC: 4.73 MIL/uL (ref 4.22–5.81)
RDW: 12.7 % (ref 11.5–15.5)
WBC: 5.3 10*3/uL (ref 4.0–10.5)

## 2014-12-06 LAB — RAPID URINE DRUG SCREEN, HOSP PERFORMED
Amphetamines: POSITIVE — AB
BENZODIAZEPINES: NOT DETECTED
Barbiturates: NOT DETECTED
Cocaine: NOT DETECTED
Opiates: POSITIVE — AB
Tetrahydrocannabinol: NOT DETECTED

## 2014-12-06 LAB — BASIC METABOLIC PANEL
Anion gap: 7 (ref 5–15)
BUN: 8 mg/dL (ref 6–20)
CALCIUM: 9.2 mg/dL (ref 8.9–10.3)
CO2: 24 mmol/L (ref 22–32)
CREATININE: 0.73 mg/dL (ref 0.61–1.24)
Chloride: 107 mmol/L (ref 101–111)
GFR calc non Af Amer: >60 mL/min (ref >60)
Glucose, Bld: 94 mg/dL (ref 65–99)
Potassium: 4.4 mmol/L (ref 3.5–5.1)
SODIUM: 138 mmol/L (ref 135–145)

## 2014-12-06 LAB — ETHANOL

## 2014-12-06 MED ORDER — ZIPRASIDONE MESYLATE 20 MG IM SOLR
INTRAMUSCULAR | Status: AC
  Administered 2014-12-06: 20 mg via INTRAMUSCULAR
  Filled 2014-12-06: qty 20

## 2014-12-06 MED ORDER — ZIPRASIDONE MESYLATE 20 MG IM SOLR
20.0000 mg | Freq: Once | INTRAMUSCULAR | Status: AC
  Administered 2014-12-06: 20 mg via INTRAMUSCULAR

## 2014-12-06 MED ORDER — HYDROXYZINE HCL 25 MG PO TABS
25.0000 mg | ORAL_TABLET | Freq: Once | ORAL | Status: AC
  Administered 2014-12-06: 25 mg via ORAL
  Filled 2014-12-06: qty 1

## 2014-12-06 MED ORDER — STERILE WATER FOR INJECTION IJ SOLN
INTRAMUSCULAR | Status: AC
  Filled 2014-12-06: qty 10

## 2014-12-06 MED ORDER — LORAZEPAM 1 MG PO TABS: 1.0000 mg | ORAL_TABLET | Freq: Four times a day (QID) | ORAL | Status: DC | PRN

## 2014-12-06 NOTE — ED Notes (Signed)
[facility] [place] [facility]. Awaiting PD arrival for transport.

## 2014-12-06 NOTE — Progress Notes (Signed)
Pt referral faxed to the following facilities who report they are accepting referrals or have bed availability:  [facility]   Will continue seeking placement.  Chad CordialLauren Carter, LCSWA 12/06/2014 11:45 AM

## 2014-12-06 NOTE — ED Notes (Signed)
Patients mother called, per patients request, and informed of patients admission [facility].

## 2014-12-06 NOTE — ED Provider Notes (Signed)
[facility], will transfer stable.   Lynette JesterKathleen Watson Robarge, DO 12/06/14 2008

## 2014-12-06 NOTE — Progress Notes (Signed)
Patient has been referred for IP treatment at: [facility] - per Olivia Mackieressa, beds available. [facility] - per Mickeal SkinnerPhoebe, "We have beds for all ages and all genders".  [facility] - per Jonny RuizJohn, ok to fax. Also, states that if referral faxed on 7/05 it will be reviewed immediately.  [facility] - per Victorino DikeJennifer, fax it. Looking at pts in the ED as well. [facility] - left voicemail. [facility] - per intake, fax referral for review.  At capacity: [facility] - per Lorenza EvangelistAimee [facility] - [facility] received, no beds at the moment.  CSW will continue to seek placement.  [place] Abtsatia Danney Bungert, LCSWA Disposition staff 12/06/2014 3:59 PM

## 2014-12-06 NOTE — ED Notes (Signed)
Patient states he is going to walk out. "I want my mom and you made her leave, and I need something for anxiety." RN informed patient of visitor policy that he had Vistaril recently and needed to give medication time to work. Patient insistent that he is going home and that his "mommy will come and take me home." RN explained to patient that he stated suicidal and he met inpatient criteria and had to be admitted to psych. "I just said that to say it." Patient remains insistent that he is leaving. MD aware and IVC paperwork signed.

## 2014-12-06 NOTE — BH Assessment (Addendum)
Tele Assessment Note   Alan Hess is an 24 y.o. male presented to [facility] with his mother after expressing SI to mother earlier today. Pt reports that he is currently suicidal without a plan. Pt denies any prior attempts however per chart review and notes from last admission, pt has attempted a few times. Pt was inpatient at [facility] in 10/2014 due to SI. Pt reports that he has no access to weapons. Pt reports that he as run out of his Alteral due to taking too much of it believing it would help his panic attacks stop. Pt reports panic attacks that happen daily for more than 6 months. Pt reports that he is under a lot of stress including the loss of his best friend two weeks ago, relationship stress, and financial stress. Pt reports depression symptoms including insomnia, loss of appetite, sadness, depressed mood, lack of motivation and an inability do take care of his personal needs like hygiene. Pt reports that he has lost 10 lbs in the last few months. Pt reports symptoms of social anxiety including avoiding social situations, feeling anxiety when in public and concerns about what other people are thinking of him. 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 Pt reports that he sees Dr. Dub Mikes and Efraim Kaufmann at [facility] therapy.      Per Pt's mother, pt is unhappy all the time and will end up killing himself if he does not get the care he needs.   Per Dr. Lucianne Muss, Pt meets inpatient criteria and TTS will seek placement for him.    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 Seasonal allergies   . ADD (attention deficit disorder)   . Depression   . Anxiety    Axis IV: economic problems, problems related to social environment and problems with access to health care services Axis  V: 31-40 impairment in reality testing  Past Medical History:  Past Medical History  Diagnosis Date  . Seasonal allergies   . ADD (attention deficit disorder)   . Depression   . Anxiety     History reviewed. No pertinent past surgical history.  Family History:  Family History  Problem Relation Age of Onset  . Cancer Other   . Diabetes Other     Social History:  reports that he has never smoked. His smokeless tobacco use includes Chew and Snuff. He reports that he uses illicit drugs (Cocaine). He reports that he does not drink alcohol.  Additional Social History:  Alcohol / Drug Use Pain Medications: pt denies Prescriptions: pt denies Over the Counter: pt denies History of alcohol / drug use?: No history of alcohol / drug abuse  CIWA: CIWA-Ar BP: 110/75 mmHg Pulse Rate: (!) 50 COWS:    PATIENT STRENGTHS: (choose at least two) Communication skills Supportive family/friends Work skills  Allergies:  Allergies  Allergen Reactions  . Iohexol Hives and Itching     Code: HIVES, Desc: became itchy after injection, Onset Date: [id number]     Home Medications:  (Not in a hospital admission)  OB/GYN Status:  No LMP for male patient.  General Assessment Data Location of Assessment: [facility] TTS Assessment: In system Is this a Tele or Face-to-Face Assessment?: Tele Assessment Is this an Initial Assessment or a Re-assessment for  this encounter?: Initial Assessment Marital status: Long term relationship Is patient pregnant?: No Pregnancy Status: No Living Arrangements: Parent, Other relatives Can pt return to current living arrangement?: Yes Admission Status: Voluntary Is patient capable of signing voluntary admission?: Yes Referral Source: Self/Family/Friend  Medical Screening Exam [facility] Walk-in ONLY) Medical Exam completed: Yes  Crisis Care Plan Living Arrangements: Parent, Other relatives  Education Status Is patient currently in school?: No Highest grade of  school patient has completed: 9  Risk to self with the past 6 months Suicidal Ideation: Yes-Currently Present Has patient been a risk to self within the past 6 months prior to admission? : Yes Suicidal Intent: Yes-Currently Present Has patient had any suicidal intent within the past 6 months prior to admission? : Yes Is patient at risk for suicide?: Yes Suicidal Plan?: No Has patient had any suicidal plan within the past 6 months prior to admission? : No Access to Means: No What has been your use of drugs/alcohol within the last 12 months?: took one pain medication yesterday Previous Attempts/Gestures: No Triggers for Past Attempts: None known Intentional Self Injurious Behavior: None Family Suicide History: No Recent stressful life event(s): Loss (Comment), Financial Problems, Trauma (Comment) (pt's best friend overdosed and died two weeks ago) Persecutory voices/beliefs?: No Depression: Yes Depression Symptoms: Despondent, Insomnia, Isolating, Fatigue, Loss of interest in usual pleasures Substance abuse history and/or treatment for substance abuse?: No Suicide prevention information given to non-admitted patients: Not applicable  Risk to Others within the past 6 months Homicidal Ideation: No Does patient have any lifetime risk of violence toward others beyond the six months prior to admission? : No Thoughts of Harm to Others: No Current Homicidal Intent: No Current Homicidal Plan: No Access to Homicidal Means: No History of harm to others?: No Assessment of Violence: None Noted Does patient have access to weapons?: No Criminal Charges Pending?: No Does patient have a court date: No Is patient on probation?: No  Psychosis Hallucinations: None noted Delusions: None noted  Mental Status Report Appearance/Hygiene: Layered clothes, Poor hygiene, Unremarkable Eye Contact: Good Motor Activity: Freedom of movement, Unremarkable Speech: Unremarkable, Soft Level of  Consciousness: Quiet/awake Mood: Depressed, Sad, Helpless Affect: Appropriate to circumstance Anxiety Level: Panic Attacks Panic attack frequency: daily since best friend died Most recent panic attack: 12/05/14 Thought Processes: Coherent, Relevant Judgement: Impaired Orientation: Person, Place, Time, Situation Obsessive Compulsive Thoughts/Behaviors: None  Cognitive Functioning Concentration: Decreased Memory: Recent Intact, Remote Intact IQ: Average Insight: Fair Impulse Control: Fair Appetite: Poor Weight Loss: 10 Sleep: Decreased Total Hours of Sleep: 0 Vegetative Symptoms: Staying in bed, Not bathing, Decreased grooming  ADLScreening [facility] Assessment Services) Patient's cognitive ability adequate to safely complete daily activities?: Yes Patient able to express need for assistance with ADLs?: Yes Independently performs ADLs?: Yes (appropriate for developmental age)  Prior Inpatient Therapy Prior Inpatient Therapy: Yes Prior Therapy Dates: 2016 Prior Therapy [facility]ilty[facility] Reason for Treatment: SI  Prior Outpatient Therapy Prior Outpatient Therapy: Yes Prior Therapy Dates: ongoing Prior Therapy Facilty/Provider(s): Dr. Dub MikesLugo Reason for Treatment: ADHD Does patient have an ACCT team?: No Does patient have Intensive In-House Services?  : No Does patient have [facility] services? : No Does patient have P4CC services?: No  ADL Screening (condition at time of admission) Patient's cognitive ability adequate to safely complete daily activities?: Yes Is the patient deaf or have difficulty hearing?: No Does the patient have difficulty seeing, even when wearing glasses/contacts?: No Does the patient have difficulty concentrating, remembering, or making decisions?: Yes Patient  able to express need for assistance with ADLs?: Yes Does the patient have difficulty dressing or bathing?: No Independently performs ADLs?: Yes (appropriate for developmental age) Does the  patient have difficulty walking or climbing stairs?: No       Abuse/Neglect Assessment (Assessment to be complete while patient is alone) Physical Abuse: Denies Verbal Abuse: Yes, past (Comment) Sexual Abuse: Denies Exploitation of patient/patient's resources: Denies Self-Neglect: Yes, present (Comment) (per mom, pt is not showering or getting out of bed) Values / Beliefs Cultural Requests During Hospitalization: None Spiritual Requests During Hospitalization: None Consults Spiritual Care Consult Needed: No Social Work Consult Needed: No Merchant navy officer (For Healthcare) Does patient have an advance directive?: No Would patient like information on creating an advanced directive?: No - patient declined information    Additional Information 1:1 In Past 12 Months?: No CIRT Risk: No Elopement Risk: No Does patient have medical clearance?: No     Disposition:  Disposition Initial Assessment Completed for this Encounter: Yes  Rollen Sox, MA, Willaim Rayas, LCASA Therapeutic Triage Specialist [facility]   12/06/2014 9:03 AM

## 2014-12-06 NOTE — ED Notes (Signed)
Pt ran out of his medication 3 days ago, taking adderal. Pt having withdrawal symptoms x 2 days. Pt c/o tremors, loss of appetite and insomnia.

## 2014-12-06 NOTE — ED Notes (Signed)
Patient has become increasingly agitated and anxious through the morning. Patient constantly pacing in room, asking nurse tech and nurse same questions repeatedly. Patient in room messing with computer, trying to access programs. Dr. Judd Lienelo informed of patient behavior. Orders for 10mg  Geodon IM received and given.

## 2014-12-06 NOTE — ED Notes (Signed)
PT c/o increased anxiety d/t out of his prescriptions and states some suicidual ideations with no plan. [facility] criteria and seeking placement at this time.

## 2014-12-06 NOTE — Progress Notes (Addendum)
[facility], bed 304[facility]2, [facility].  Call report at [phone].  RN Lelon MastSamantha was informed.  [place] Abtsatia Kamdon Reisig, LCSWA Disposition staff 12/06/2014 7:49 PM

## 2014-12-06 NOTE — ED Notes (Signed)
Officer here to transport patient. Patient belonging given to officer.

## 2014-12-06 NOTE — ED Provider Notes (Addendum)
CSN: [id number]     Arrival date & time 12/06/14  0736 History   First MD Initiated Contact with Patient 12/06/14 0750     Chief Complaint  Patient presents with  . Withdrawal     (Consider location/radiation/quality/duration/timing/severity/associated sxs/prior Treatment) HPI Comments: Patient is a 24 year old male brought by his mother for evaluation of what she believes to be withdrawal from his Adderall. He apparently ran out of this and is not due for it to be refilled until the 20th, which is more than 2 weeks away. She states that he has not been sleeping, has had difficulty thinking, and has been very anxious. He denies any fevers or chills. He denies any chest pain.  He denies any suicidal or homicidal ideation.  The history is provided by the patient.    Past Medical History  Diagnosis Date  . Seasonal allergies   . ADD (attention deficit disorder)   . Depression   . Anxiety    History reviewed. No pertinent past surgical history. Family History  Problem Relation Age of Onset  . Cancer Other   . Diabetes Other    History  Substance Use Topics  . Smoking status: Never Smoker   . Smokeless tobacco: Current User    Types: Chew, Snuff  . Alcohol Use: No    Review of Systems  All other systems reviewed and are negative.     Allergies  Iohexol  Home Medications   Prior to Admission medications   Medication Sig Start Date End Date Taking? Authorizing Provider  amphetamine-dextroamphetamine (ADDERALL) 20 MG tablet Take 1 tablet (20 mg total) by mouth 2 (two) times daily. For attention 10/09/14   Thermon Leyland, NP  hydrOXYzine (ATARAX/VISTARIL) 50 MG tablet Take 1 tablet (50 mg total) by mouth every 6 (six) hours as needed for anxiety (sleep). 10/09/14   Thermon Leyland, NP  ibuprofen (ADVIL,MOTRIN) 800 MG tablet Take 1 tablet (800 mg total) by mouth every 8 (eight) hours as needed for mild pain. Patient not taking: Reported on 10/06/2014 07/30/14   Layla Maw Ward, DO   traZODone (DESYREL) 50 MG tablet Take 1 tablet (50 mg total) by mouth at bedtime and may repeat dose one time if needed. 10/09/14   Thermon Leyland, NP   BP 110/75 mmHg  Pulse 50  Temp(Src) 98.3 F (36.8 C) (Oral)  Resp 16  Ht  (1.753 m)  Wt 130 lb (58.968 kg)  BMI 19.19 kg/m2  SpO2 100% Physical Exam  Constitutional: He is oriented to person, place, and time. He appears well-developed and well-nourished. No distress.  HENT:  Head: Normocephalic and atraumatic.  Eyes: EOM are normal. Pupils are equal, round, and reactive to light.  Neck: Normal range of motion. Neck supple.  Cardiovascular: Normal rate, regular rhythm and normal heart sounds.   No murmur heard. Pulmonary/Chest: Effort normal and breath sounds normal. No respiratory distress. He has no wheezes.  Abdominal: Soft. Bowel sounds are normal.  Musculoskeletal: Normal range of motion. He exhibits no edema.  Neurological: He is alert and oriented to person, place, and time. No cranial nerve deficit. He exhibits normal muscle tone. Coordination normal.  Skin: Skin is warm and dry. He is not diaphoretic.  Nursing note and vitals reviewed.   ED Course  Procedures (including critical care time) Labs Review Labs Reviewed - No data to display  Imaging Review No results found.   EKG Interpretation None      MDM   Final diagnoses:  None    Patient presents here with complaints of withdrawal from Adderall. He has been taking more than he should over the past 2 weeks. He has run out of this medication 2 weeks early. His mother reports that he is not functioning and not sleeping. She is requesting something to help him sleep and calm him down.  From reviewing the patient's chart, it has been documented multiple times that there is suspicion of drug seeking behavior and drug abuse. I have informed the patient and the mother that under no circumstances will I refill a prescription for Adderall in the emergency department  giving this situation. I have agreed to prescribe 2 tablets of Ativan which she can give to her son to help him sleep and calm down between now and tomorrow morning when she can call his prescribing physician.    Geoffery Lyonsouglas Jovon Winterhalter, MD 12/06/14 [phone]   Addendum:  As the patient was being discharged, he is now informing the nurse that he feels suicidal and unsafe to go home. He is requesting to speak with a Behavioral Health representative and feels as though he requires inpatient psychiatric care. He reports to me he was an inpatient on the psychiatric unit in the past, however "did not take it seriously". He feels as though he is now ready to take his mental health seriously.  Geoffery Lyonsouglas Denisha Hoel, MD 12/06/14 [phone]   Patient was evaluated by TTS and felt to be in need of inpatient hospitalization. Patient repeatedly requests "something for his nerves". He was administered hydroxyzine, however this is not the medication he is requesting and is unhappy about this. He is now threatening to leave because he is not receiving the medications that he wants. I have advised him that he is in need of hospitalization and should he attempt to go home, he will be involuntarily committed. Patient continues to threaten to leave and has undergone IVC. He has become increasingly difficult to deal with and agitated. He is fumbling through the drawers and computers and his exam room. He was administered IM Geodon due to his inappropriate behavior and the fact that I believe he represents a danger to himself.  Geoffery Lyonsouglas Tarini Carrier, MD 12/06/14 [phone]

## 2014-12-06 NOTE — ED Notes (Signed)
Patient allowed 5 minute phone call, his third call of the day.

## 2014-12-06 NOTE — ED Notes (Signed)
Patient and mother requesting to speak to MD regarding request for patient to be admitted to behavioral health. Patient states he has been taking extra doses of his Adderal every few days, causing him to run out of his Rx early. Patient states he "would rather die than feel this way without my medicine". States he lost his best friend a couple weeks ago due to a heroin overdose. States he is depressed, sad, and stressed.

## 2014-12-06 NOTE — Discharge Instructions (Signed)
Stimulant Use Disorder-Amphetamines  °Amphetamines are one of a group of powerful drugs known as stimulants. Amphetamines have a number of medical uses, including the treatment of a daytime sleepiness disorder due to narcolepsy or sleep apnea, attention deficit hyperactivity disorder, and chronic fatigue syndrome. However, amphetamines also are often misused because of the effects they produce. These effects include: °· A feeling of extreme pleasure (euphoria). °· Alertness. °· Increased attention. °· High energy. °· Loss of appetite for weight loss. °Common street names for these drugs include speed and crank. Amphetamines are taken by mouth, crushed and snorted, or dissolved in water and injected. °Stimulants are addictive because they activate regions of the brain that are responsible for producing both the pleasurable sensation of "reward" and psychological dependence. Together, these actions account for loss of control and the rapid development of drug dependence. This means you will become ill without the drug (withdrawal) and need to keep using it to function.  °Stimulant use disorder is use of stimulants that disrupts your daily life. It disrupts relationships with family and friends and how you do your job. Amphetamines increase blood pressure and heart rate. Use can lead to heart attack or stroke. Use can also cause death from irregular heart rate, seizures, or dangerously high body temperature. °SIGNS AND SYMPTOMS  °Symptoms of stimulant use disorder with amphetamines include: °· Use of amphetamines in larger amounts or over a longer period than intended. °· Unsuccessful attempts to cut down or control amphetamine use. °· A lot of time spent obtaining, using, or recovering from the effects of amphetamines. °· A strong desire or urge to use amphetamines (craving). °· Continued use of amphetamines in spite of major problems at work, school, or home because of use. °· Continued use of amphetamines in spite  of relationship problems because of use. °· Giving up or cutting down on important life activities because of amphetamine use. °· Use of amphetamines over and over in situations when it is physically hazardous, such as driving a car. °· Continued use of amphetamines in spite of a physical problem that is likely related to amphetamine use. Physical problems can include: °¨ Unintended weight loss. °¨ High blood pressure. °¨ Chest pain. °¨ Infections such as human immunodeficiency virus and hepatitis (from injecting amphetamines). °· Continued use of amphetamines in spite of mental problems that are likely related to use. Mental problems can include: °¨ Anxiety. °¨ Sleep problems. °¨ Schizophrenia-like symptoms. °¨ Depression. °¨ Bipolar mood swings. °¨ Violent behavior. °· Need to use more and more amphetamines to get the same effect, or lessened effect over time with use of the same amount (tolerance). °· Having withdrawal symptoms when amphetamine use is stopped, or using amphetamines to reduce or avoid withdrawal symptoms. Withdrawal symptoms include: °¨ Depressed mood. °¨ Low energy or restlessness. °¨ Bad dreams. °¨ Too little or too much sleep. °¨ Increased appetite. °DIAGNOSIS  °Stimulant use disorder is diagnosed by your health care provider. You may be asked questions about your amphetamine use and how it affects your life. A physical exam may be done. A drug screen may be ordered. You may be referred to a mental health professional. The diagnosis of stimulant use disorder requires two or more symptoms within 12 months. The type of stimulant use disorder you have depends on the number of signs and symptoms you have. The type may be: °· Mild. Two or three signs and symptoms. °· Moderate. Four or five signs and symptoms. °· Severe. Six or more signs   and symptoms. TREATMENT  The treatment for most problems related to stimulant use disorder with amphetamines may be divided into two types:  Short-term medical  treatment. This helps to preserve life and prevent or minimize damage from physical or mental problems related to use.  Long-term substance abuse treatment. This focuses on recovery from use disorder. It is provided by mental health professionals who have training in substance use disorders. It is usually a combination of counseling, support groups, and nonaddictive medicines that can reduce cravings or block the effects of amphetamines. HOME CARE INSTRUCTIONS   Take medicines only as directed by your health care provider.  Identify the people and activities that trigger your amphetamine use and avoid them.  Keep all follow-up visits as directed by your health care provider. SEEK MEDICAL CARE IF:  Your symptoms get worse or you relapse.  You are not able to take medicines as directed. SEEK IMMEDIATE MEDICAL CARE IF:   You have serious thoughts about hurting yourself or others.  You have a seizure, chest pain, sudden weakness, or loss of speech or vision. FOR MORE INFORMATION  National Institute on Drug Abuse: http://www.price-smith.com/www.drugabuse.gov  Substance Abuse and Mental Health Services Administration: SkateOasis.com.ptwww.samhsa.gov Document Released: 05/15/2001 Document Revised: 10/05/2013 Document Reviewed: 06/03/2013 [facility] Patient Information 2015 [place]. This information is not intended to replace advice given to you by your health care provider. Make sure you discuss any questions you have with your health care provider.

## 2014-12-07 ENCOUNTER — Encounter (HOSPITAL_COMMUNITY): Payer: Self-pay | Admitting: *Deleted

## 2014-12-07 DIAGNOSIS — F439 Reaction to severe stress, unspecified: Secondary | ICD-10-CM

## 2014-12-07 DIAGNOSIS — F430 Acute stress reaction: Secondary | ICD-10-CM | POA: Diagnosis present

## 2014-12-07 DIAGNOSIS — F1523 Other stimulant dependence with withdrawal: Secondary | ICD-10-CM | POA: Diagnosis present

## 2014-12-07 DIAGNOSIS — F1593 Other stimulant use, unspecified with withdrawal: Secondary | ICD-10-CM | POA: Diagnosis present

## 2014-12-07 DIAGNOSIS — F19930 Other psychoactive substance use, unspecified with withdrawal, uncomplicated: Secondary | ICD-10-CM

## 2014-12-07 DIAGNOSIS — F902 Attention-deficit hyperactivity disorder, combined type: Secondary | ICD-10-CM

## 2014-12-07 MED ORDER — TRAZODONE HCL 50 MG PO TABS
50.0000 mg | ORAL_TABLET | Freq: Every evening | ORAL | Status: DC | PRN
  Filled 2014-12-07 (×5): qty 1

## 2014-12-07 MED ORDER — ACETAMINOPHEN 325 MG PO TABS
650.0000 mg | ORAL_TABLET | Freq: Four times a day (QID) | ORAL | Status: DC | PRN
  Administered 2014-12-07 – 2014-12-08 (×3): 650 mg via ORAL
  Filled 2014-12-07 (×3): qty 2

## 2014-12-07 MED ORDER — ENSURE ENLIVE PO LIQD
237.0000 mL | Freq: Two times a day (BID) | ORAL | Status: DC
  Administered 2014-12-07: 237 mL via ORAL

## 2014-12-07 MED ORDER — ALUM & MAG HYDROXIDE-SIMETH 200-200-20 MG/5ML PO SUSP: 30.0000 mL | ORAL | Status: DC | PRN

## 2014-12-07 MED ORDER — NICOTINE 21 MG/24HR TD PT24
21.0000 mg | MEDICATED_PATCH | Freq: Every day | TRANSDERMAL | Status: DC
  Filled 2014-12-07 (×2): qty 1

## 2014-12-07 MED ORDER — AMPHETAMINE-DEXTROAMPHETAMINE 10 MG PO TABS
20.0000 mg | ORAL_TABLET | Freq: Two times a day (BID) | ORAL | Status: DC
  Administered 2014-12-07: 20 mg via ORAL
  Filled 2014-12-07: qty 2

## 2014-12-07 MED ORDER — HYDROXYZINE HCL 50 MG PO TABS
50.0000 mg | ORAL_TABLET | Freq: Four times a day (QID) | ORAL | Status: DC | PRN
  Administered 2014-12-07 – 2014-12-12 (×8): 50 mg via ORAL
  Filled 2014-12-07 (×10): qty 1

## 2014-12-07 MED ORDER — AMPHETAMINE-DEXTROAMPHETAMINE 10 MG PO TABS
20.0000 mg | ORAL_TABLET | Freq: Once | ORAL | Status: AC
  Administered 2014-12-07: 20 mg via ORAL
  Filled 2014-12-07: qty 2

## 2014-12-07 MED ORDER — ENSURE ENLIVE PO LIQD
237.0000 mL | Freq: Three times a day (TID) | ORAL | Status: DC
  Administered 2014-12-07 – 2014-12-13 (×14): 237 mL via ORAL
  Filled 2014-12-07: qty 237

## 2014-12-07 MED ORDER — MAGNESIUM HYDROXIDE 400 MG/5ML PO SUSP: 30.0000 mL | Freq: Every day | ORAL | Status: DC | PRN

## 2014-12-07 MED ORDER — AMPHETAMINE-DEXTROAMPHETAMINE 10 MG PO TABS
20.0000 mg | ORAL_TABLET | Freq: Two times a day (BID) | ORAL | Status: DC
  Administered 2014-12-08 – 2014-12-13 (×12): 20 mg via ORAL
  Filled 2014-12-07 (×12): qty 2

## 2014-12-07 MED ORDER — ONDANSETRON 4 MG PO TBDP
4.0000 mg | ORAL_TABLET | Freq: Four times a day (QID) | ORAL | Status: DC | PRN
  Administered 2014-12-08 (×3): 4 mg via ORAL
  Filled 2014-12-07 (×3): qty 1

## 2014-12-07 MED ORDER — TRAZODONE HCL 100 MG PO TABS
100.0000 mg | ORAL_TABLET | Freq: Every evening | ORAL | Status: DC | PRN
  Administered 2014-12-07: 100 mg via ORAL
  Filled 2014-12-07 (×11): qty 1

## 2014-12-07 MED ORDER — NICOTINE POLACRILEX 2 MG MT GUM
2.0000 mg | CHEWING_GUM | OROMUCOSAL | Status: DC | PRN
  Administered 2014-12-07 – 2014-12-13 (×29): 2 mg via ORAL
  Filled 2014-12-07 (×20): qty 1

## 2014-12-07 NOTE — Progress Notes (Signed)
Recreation Therapy Notes  Animal-Assisted Activity (AAA) Program Checklist/Progress Notes Patient Eligibility Criteria Checklist & Daily Group note for Rec Tx Intervention  Date: 07.05.16 Time: 230 pm Location: [address] Dayroom   AAA/T Program Assumption of Risk Form signed by Patient/ or Parent Legal Guardian yes  Patient is free of allergies or sever asthma yes  Patient reports no fear of animals yes  Patient reports no history of cruelty to animalsyes  Patient understands his/her participation is voluntary yes  Patient washes hands before animal contact yes  Patient washes hands after animal contact yes  Education: Hand Washing, Appropriate Animal Interaction   Education Outcome: Acknowledges understanding/In group clarification offered/Needs additional education.   Clinical Observations/Feedback: Did not attend group.   Alan Hess, LRT/CTRS         Alan Hess 12/07/2014 4:40 PM 

## 2014-12-07 NOTE — Progress Notes (Signed)
NUTRITION ASSESSMENT  Pt identified as at risk on the Malnutrition Screen Tool  INTERVENTION: 1. Educated patient on the importance of nutrition and encouraged intake of food and beverages. 2. Discussed weight goals. 3. Supplements: continue order for Ensure Enlive BID, each supplement provides 350 kcal and 20 grams of protein  NUTRITION DIAGNOSIS: Unintentional weight loss related to sub-optimal intake as evidenced by pt report.   Goal: Pt to meet >/= 90% of their estimated nutrition needs.  Monitor:  PO intake  Assessment:  Pt seen for MST. Pt ordered Ensure Enlive BID. Pt ran out of medication 2-3 days ago causing lack of appetite and heightened anxiety; good appetite at this time.  Per chart review, pt has lost 3 lbs in the past 2 months which is not significant for time frame. Question accuracy of some weight recordings. Also note 20 lb weight loss (15% body weight) in the past 4 months which is significant for time frame.  24 y.o. male  Height: Ht Readings from Last 1 Encounters:  12/07/14 5\' 9"  (1.753 m)    Weight: Wt Readings from Last 1 Encounters:  12/07/14 110 lb (49.896 kg)    Weight Hx: Wt Readings from Last 10 Encounters:  12/07/14 110 lb (49.896 kg)  12/06/14 130 lb (58.968 kg)  10/07/14 113 lb (51.256 kg)  10/06/14 130 lb (58.968 kg)  08/11/14 130 lb (58.968 kg)  08/08/14 130 lb (58.968 kg)  08/07/14 130 lb (58.968 kg)  07/30/14 130 lb (58.968 kg)  07/13/14 135 lb (61.236 kg)  06/29/14 130 lb (58.968 kg)    BMI:  Body mass index is 16.24 kg/(m^2). Pt meets criteria for underweight based on current BMI.  Estimated Nutritional Needs: Kcal: 25-30 kcal/kg Protein: > 1 gram protein/kg Fluid: 1 ml/kcal  Diet Order: Diet regular Room service appropriate?: Yes; Fluid consistency:: Thin Pt is also offered choice of unit snacks mid-morning and mid-afternoon.  Pt is eating as desired.   Lab results and medications reviewed.     Trenton GammonJessica Adir Schicker,  RD, LDN Inpatient Clinical Dietitian Pager # [phone] After hours/weekend pager # [phone]

## 2014-12-07 NOTE — Progress Notes (Signed)
Patient received Trazodone for sleep and Vistaril for anxiety on getting to the unit. Per report from admission nurse patient to be evaluated this morning by psychiatrist for re ordering the Adderral. Patient walked to the window and requested for medications this am. Writer told patient he had on basic orders and that he would be evaluated by the psychiatrist  this morning for either re ordering his psychiatric medications. Patient stated ; "I need my Adderral" . He walked back to his room. Writer went to patient room and asked him what symptom he was having and offered Vistaril for anxiety. Patient refused Vistaril, stating " I need my Adderral. Writer asked patient to go and have his V/S done, patient refused. Staff checked patient's v/s , it was 118/ 67. Patient requested for the Vistaril after that. Vistaril 50 mg given for Anxiety. Q 15 minutes check continues for safety.

## 2014-12-07 NOTE — [facility] Suicide Risk Assessment (Signed)
[facility]   Nursing information obtained from:    Demographic factors:    Current Mental Status:    Loss Factors:    Historical Factors:    Risk Reduction Factors:    Total Time spent with patient: 45 minutes Principal Problem: Amphetamine withdrawal Diagnosis:   Patient Active Problem List   Diagnosis Date Noted  . Acute stress disorder [F43.9] 12/07/2014  . 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 ADHD (attention deficit hyperactivity disorder) [F90.9] 10/07/2014  . PTSD (post-traumatic stress disorder) [F43.10] 10/07/2014  . Substance abuse [F19.10] 10/07/2014  . Seasonal allergies [J30.2]   . ADD (attention deficit disorder) [F90.9]      Continued Clinical Symptoms:  Alcohol Use Disorder Identification Test Final Score (AUDIT): 0 The "Alcohol Use Disorders Identification Test", Guidelines for Use in Primary Care, Second Edition.  World Science writerHealth Organization [facility](WHO). Score between 0-7:  no or low risk or alcohol related problems. Score between 8-15:  moderate risk of alcohol related problems. Score between 16-19:  high risk of alcohol related problems. Score 20 or above:  warrants further diagnostic evaluation for alcohol dependence and treatment.   CLINICAL FACTORS:   Severe Anxiety and/or Agitation Alcohol/Substance Abuse/Dependencies  ychiatric Specialty Exam: Physical Exam  ROS  Blood pressure 113/67, pulse 69, temperature 98.7 F (37.1 C), temperature source Oral, resp. rate 18, height 5\' 9"  (1.753 m), weight 49.896 kg (110 lb).Body mass index is 16.24 kg/(m^2).    COGNITIVE FEATURES THAT CONTRIBUTE TO RISK:  Closed-mindedness, Polarized thinking and Thought constriction (tunnel vision)    SUICIDE RISK:   Moderate:  Frequent suicidal ideation with limited intensity, and duration, some specificity in terms of plans, no associated intent, good self-control, limited dysphoria/symptomatology, some risk factors present, and identifiable  protective factors, including available and accessible social support.  PLAN OF CARE: Supportive approach/coping skills                               Amphetamine withdrawal/resume the Adderall                               Anxiety: will work with CBT/mindfulness  Medical Decision Making:  Review of Psycho-Social Stressors (1), Review or order clinical lab tests (1), Review of Medication Regimen & Side Effects (2) and Review of New Medication or Change in Dosage (2)  I certify that inpatient services furnished can reasonably be expected to improve the patient's condition.   Haddie Bruhl A 12/07/2014, 5:51 PM

## 2014-12-07 NOTE — Tx Team (Signed)
Initial Interdisciplinary Treatment Plan   PATIENT STRESSORS: Substance abuse Abusing prescription medication   PATIENT STRENGTHS: Ability for insight Average or above average intelligence Capable of independent living General fund of knowledge Motivation for treatment/growth Supportive family/friends   PROBLEM LIST: Problem List/Patient Goals Date to be addressed Date deferred Reason deferred Estimated date of resolution  Depression 12/07/14     Anxiety 12/07/14     Suicidal Ideation 12/07/14     "I want to learn on how to manage my anxiety" 12/07/14                                    DISCHARGE CRITERIA:  Ability to meet basic life and health needs Improved stabilization in mood, thinking, and/or behavior Verbal commitment to aftercare and medication compliance  PRELIMINARY DISCHARGE PLAN: Attend aftercare/continuing care group Return to previous living arrangement  PATIENT/FAMIILY INVOLVEMENT: This treatment plan has been presented to and reviewed with the patient, Alan Hess, and/or family member, .  The patient and family have been given the opportunity to ask questions and make suggestions.  Kenzlee Fishburn, [place] 12/07/2014, 12:32 AM

## 2014-12-07 NOTE — [facility] Group Notes (Signed)
Adult Psychoeducational Group Note  Date:  12/07/2014 Time:  0850am  Group Topic/Focus:  Goals Group:   The focus of this group is to help patients establish daily goals to achieve during treatment and discuss how the patient can incorporate goal setting into their daily lives to aide in recovery.  Participation Level:  Did Not Attend  Lauris Poagndrea B Lothar Prehn 12/07/2014, 2:41 PM
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
## 2014-12-07 NOTE — [facility] Group Notes (Signed)
[facility] 1:15pm  Type of Therapy: [facility] Presentation  Pt did not attend, declined invitation.   Chad CordialLauren Carter, LCSWA 12/07/2014 1:32 PM

## 2014-12-07 NOTE — Progress Notes (Signed)
24 year old male pt admitted on involuntary basis. Pt, on admission, reports that he has been off his adderall for 4 days and reports that he is withdrawing from it during admission. Pt does present as anxious and tremulous and does endorse anxiety. Pt does endorse depression as well but denies SI and is able to contract for safety on the unit. Pt did report that he was taking his medications as prescribed and denies over-using his adderall. Pt was oriented to the unit and safety maintained.

## 2014-12-07 NOTE — Tx Team (Signed)
Interdisciplinary Treatment Plan Update (Adult)  Date:  12/07/2014 Time Reviewed:  6:56 PM  Progress in Treatment: Attending groups: Yes. Participating in groups:  Yes. Taking medication as prescribed:  Yes. Tolerating medication:  Yes. Family/Significant othe contact made:  No, will contact:  will contact w patient permission Patient understands diagnosis:  No, patient continues to seek stimulant medication  Discussing patient identified problems/goals with staff:  Yes. and No. Medical problems stabilized or resolved:  Yes. Denies suicidal/homicidal ideation: Yes. Issues/concerns per patient self-inventory:  No. Other:  New problem(s) identified: Yes, Describe:  stimulant seeking, no insurance for outpatient care  Discharge Plan or Barriers:  Lack of insurance  Reason for Continuation of Hospitalization: Anxiety Medication stabilization Withdrawal symptoms    Estimated length of stay: 3 - 5 days  New goal(s):  Address patient concern w stimulant medication, has no insurance coverage  Review of initial/current patient goals per problem list:   See intiial care plans  Attendees: Patient:   7/5/20166:56 PM  Family:   7/5/20166:56 PM  Physician:  Jola BaptistI Lugo MD 7/5/20166:56 PM  Nursing:   Janeece RiggersAndrea RN 7/5/20166:56 PM  Case Manager:  Santa GeneraAnne Cunningham, LCSW 7/5/20166:56 PM  Counselor:   7/5/20166:56 PM  Other:  Tedra CoupeV Enoch Monarch TCT 7/5/20166:56 PM  Other:  Marilynn LatinoJ Clark,RN UR 7/5/20166:56 PM  Other:   7/5/20166:56 PM  Other:  7/5/20166:56 PM  Other:  7/5/20166:56 PM  Other:  7/5/20166:56 PM  Other:  7/5/20166:56 PM  Other:  7/5/20166:56 PM  Other:  7/5/20166:56 PM  Other:   7/5/20166:56 PM   Scribe for Treatment Team:   Sallee Langeunningham, Anne C, 12/07/2014, 6:56 PM

## 2014-12-07 NOTE — Progress Notes (Signed)
D:  Per pt self inventory pt reports sleeping poor, appetite fair, energy level low, ability to pay attention poor, rates depression at a 5 out of 10, hopelessness at a 3 out of 10, anxiety at a 4 out of 10, denies SI/HI/AVH, pt c/o some withdrawal s/s and is on a protocol, during interaction pt is anxious and very medication focused on his Adderall, goal today:  "to get right on my meds"      A:  Emotional support provided, Encouraged pt to continue with treatment plan and attend all group activities, q15 min checks maintained for safety.  R:  Pt is not going to any groups, refused to go to lunch today because he wanted to wait for his Adderall order to be ready and administered, pt is flat and anxious, isolating in room and does not interact much with other patients on the unit.

## 2014-12-07 NOTE — Progress Notes (Signed)
Alan Hess did not attend wrap up group tonight, he was asleep in his bed.

## 2014-12-08 ENCOUNTER — Encounter (HOSPITAL_COMMUNITY): Payer: Self-pay | Admitting: Emergency Medicine

## 2014-12-08 ENCOUNTER — Inpatient Hospital Stay (HOSPITAL_COMMUNITY): Payer: No Typology Code available for payment source

## 2014-12-08 ENCOUNTER — Emergency Department (HOSPITAL_COMMUNITY)
Admission: EM | Admit: 2014-12-08 | Discharge: 2014-12-09 | Discharge status: Absent without leave | Payer: No Typology Code available for payment source

## 2014-12-08 LAB — URINALYSIS, ROUTINE W REFLEX MICROSCOPIC
Bilirubin Urine: NEGATIVE
Glucose, UA: NEGATIVE mg/dL
HGB URINE DIPSTICK: NEGATIVE
Ketones, ur: NEGATIVE mg/dL
Leukocytes, UA: NEGATIVE
Nitrite: NEGATIVE
PROTEIN: 100 mg/dL — AB
Specific Gravity, Urine: 1.034 — ABNORMAL HIGH (ref 1.005–1.030)
Urobilinogen, UA: 1 mg/dL (ref 0.0–1.0)
pH: 8.5 — ABNORMAL HIGH (ref 5.0–8.0)

## 2014-12-08 LAB — COMPREHENSIVE METABOLIC PANEL
ALBUMIN: 5.8 g/dL — AB (ref 3.5–5.0)
ALK PHOS: 62 U/L (ref 38–126)
ALT: 13 U/L — AB (ref 17–63)
AST: 21 U/L (ref 15–41)
Anion gap: 18 — ABNORMAL HIGH (ref 5–15)
BUN: 19 mg/dL (ref 6–20)
CALCIUM: 10.9 mg/dL — AB (ref 8.9–10.3)
CO2: 24 mmol/L (ref 22–32)
Chloride: 99 mmol/L — ABNORMAL LOW (ref 101–111)
Creatinine, Ser: 1.15 mg/dL (ref 0.61–1.24)
GFR calc Af Amer: >60 mL/min (ref >60)
GFR calc non Af Amer: >60 mL/min (ref >60)
Glucose, Bld: 129 mg/dL — ABNORMAL HIGH (ref 65–99)
POTASSIUM: 3.6 mmol/L (ref 3.5–5.1)
SODIUM: 141 mmol/L (ref 135–145)
TOTAL PROTEIN: 9 g/dL — AB (ref 6.5–8.1)
Total Bilirubin: 2.7 mg/dL — ABNORMAL HIGH (ref 0.3–1.2)

## 2014-12-08 LAB — CBC WITH DIFFERENTIAL/PLATELET
Basophils Absolute: 0 10*3/uL (ref 0.0–0.1)
Basophils Relative: 0 % (ref 0–1)
Eosinophils Absolute: 0 10*3/uL (ref 0.0–0.7)
Eosinophils Relative: 0 % (ref 0–5)
HCT: 50 % (ref 39.0–52.0)
HEMOGLOBIN: 17.3 g/dL — AB (ref 13.0–17.0)
LYMPHS ABS: 1.3 10*3/uL (ref 0.7–4.0)
LYMPHS PCT: 10 % — AB (ref 12–46)
MCH: 30.4 pg (ref 26.0–34.0)
MCHC: 34.6 g/dL (ref 30.0–36.0)
MCV: 87.9 fL (ref 78.0–100.0)
MONOS PCT: 10 % (ref 3–12)
Monocytes Absolute: 1.2 10*3/uL — ABNORMAL HIGH (ref 0.1–1.0)
NEUTROS ABS: 9.6 10*3/uL — AB (ref 1.7–7.7)
NEUTROS PCT: 80 % — AB (ref 43–77)
Platelets: 340 10*3/uL (ref 150–400)
RBC: 5.69 MIL/uL (ref 4.22–5.81)
RDW: 12.9 % (ref 11.5–15.5)
WBC: 12.1 10*3/uL — ABNORMAL HIGH (ref 4.0–10.5)

## 2014-12-08 LAB — URINE MICROSCOPIC-ADD ON

## 2014-12-08 LAB — LIPASE, BLOOD: Lipase: 19 U/L — ABNORMAL LOW (ref 22–51)

## 2014-12-08 MED ORDER — MORPHINE SULFATE 4 MG/ML IJ SOLN
4.0000 mg | Freq: Once | INTRAMUSCULAR | Status: AC
  Administered 2014-12-08: 4 mg via INTRAVENOUS
  Filled 2014-12-08: qty 1

## 2014-12-08 MED ORDER — ACETAMINOPHEN 325 MG PO TABS
650.0000 mg | ORAL_TABLET | Freq: Four times a day (QID) | ORAL | Status: DC | PRN
  Administered 2014-12-08: 650 mg via ORAL
  Filled 2014-12-08: qty 2

## 2014-12-08 MED ORDER — KETOROLAC TROMETHAMINE 30 MG/ML IJ SOLN
30.0000 mg | Freq: Once | INTRAMUSCULAR | Status: AC
  Administered 2014-12-08: 30 mg via INTRAVENOUS
  Filled 2014-12-08: qty 1

## 2014-12-08 MED ORDER — SODIUM CHLORIDE 0.9 % IV BOLUS (SEPSIS)
1000.0000 mL | Freq: Once | INTRAVENOUS | Status: AC
  Administered 2014-12-08: 1000 mL via INTRAVENOUS

## 2014-12-08 MED ORDER — LORAZEPAM 1 MG PO TABS
1.0000 mg | ORAL_TABLET | ORAL | Status: DC | PRN
  Administered 2014-12-08 – 2014-12-13 (×18): 1 mg via ORAL
  Filled 2014-12-08 (×19): qty 1

## 2014-12-08 MED ORDER — IBUPROFEN 600 MG PO TABS
ORAL_TABLET | ORAL | Status: AC
  Administered 2014-12-08: 13:00:00
  Filled 2014-12-08: qty 1

## 2014-12-08 MED ORDER — METHOCARBAMOL 500 MG PO TABS
500.0000 mg | ORAL_TABLET | Freq: Four times a day (QID) | ORAL | Status: DC | PRN
Start: 2014-12-08 — End: 2014-12-13
  Administered 2014-12-08 – 2014-12-12 (×3): 500 mg via ORAL
  Filled 2014-12-08 (×3): qty 1

## 2014-12-08 MED ORDER — IBUPROFEN 600 MG PO TABS: 600.0000 mg | ORAL_TABLET | Freq: Four times a day (QID) | ORAL | Status: DC | PRN

## 2014-12-08 NOTE — ED Notes (Signed)
Patient transported to MRI 

## 2014-12-08 NOTE — Discharge Instructions (Signed)
Back Pain, Adult °Low back pain is very common. About 1 in 5 people have back pain. The cause of low back pain is rarely dangerous. The pain often gets better over time. About half of people with a sudden onset of back pain feel better in just 2 weeks. About 8 in 10 people feel better by 6 weeks.  °CAUSES °Some common causes of back pain include: °· Strain of the muscles or ligaments supporting the spine. °· Wear and tear (degeneration) of the spinal discs. °· Arthritis. °· Direct injury to the back. °DIAGNOSIS °Most of the time, the direct cause of low back pain is not known. However, back pain can be treated effectively even when the exact cause of the pain is unknown. Answering your caregiver's questions about your overall health and symptoms is one of the most accurate ways to make sure the cause of your pain is not dangerous. If your caregiver needs more information, he or she may order lab work or imaging tests (X-rays or MRIs). However, even if imaging tests show changes in your back, this usually does not require surgery. °HOME CARE INSTRUCTIONS °For many people, back pain returns. Since low back pain is rarely dangerous, it is often a condition that people can learn to manage on their own.  °· Remain active. It is stressful on the back to sit or stand in one place. Do not sit, drive, or stand in one place for more than 30 minutes at a time. Take short walks on level surfaces as soon as pain allows. Try to increase the length of time you walk each day. °· Do not stay in bed. Resting more than 1 or 2 days can delay your recovery. °· Do not avoid exercise or work. Your body is made to move. It is not dangerous to be active, even though your back may hurt. Your back will likely heal faster if you return to being active before your pain is gone. °· Pay attention to your body when you  bend and lift. Many people have less discomfort when lifting if they bend their knees, keep the load close to their bodies, and  avoid twisting. Often, the most comfortable positions are those that put less stress on your recovering back. °· Find a comfortable position to sleep. Use a firm mattress and lie on your side with your knees slightly bent. If you lie on your back, put a pillow under your knees. °· Only take over-the-counter or prescription medicines as directed by your caregiver. Over-the-counter medicines to reduce pain and inflammation are often the most helpful. Your caregiver may prescribe muscle relaxant drugs. These medicines help dull your pain so you can more quickly return to your normal activities and healthy exercise. °· Put ice on the injured area. °¨ Put ice in a plastic bag. °¨ Place a towel between your skin and the bag. °¨ Leave the ice on for 15-20 minutes, 03-04 times a day for the first 2 to 3 days. After that, ice and heat may be alternated to reduce pain and spasms. °· Ask your caregiver about trying back exercises and gentle massage. This may be of some benefit. °· Avoid feeling anxious or stressed. Stress increases muscle tension and can worsen back pain. It is important to recognize when you are anxious or stressed and learn ways to manage it. Exercise is a great option. °SEEK MEDICAL CARE IF: °· You have pain that is not relieved with rest or medicine. °· You have pain that does not improve in 1 week. °· You have new symptoms. °· You are generally not feeling well. °SEEK   IMMEDIATE MEDICAL CARE IF:  °· You have pain that radiates from your back into your legs. °· You develop new bowel or bladder control problems. °· You have unusual weakness or numbness in your arms or legs. °· You develop nausea or vomiting. °· You develop abdominal pain. °· You feel faint. °Document Released: 05/21/2005 Document Revised: 11/20/2011 Document Reviewed: 09/22/2013 °ExitCare® Patient Information ©2015 [employer]. This information is not intended to replace advice given to you by your health care provider. Make sure you  discuss any questions you have with your health care provider. °Nausea and Vomiting °Nausea is a sick feeling that often comes before throwing up (vomiting). Vomiting is a reflex where stomach contents come out of your mouth. Vomiting can cause severe loss of body fluids (dehydration). Children and elderly adults can become dehydrated quickly, especially if they also have diarrhea. Nausea and vomiting are symptoms of a condition or disease. It is important to find the cause of your symptoms. °CAUSES  °· Direct irritation of the stomach lining. This irritation can result from increased acid production (gastroesophageal reflux disease), infection, food poisoning, taking certain medicines (such as nonsteroidal anti-inflammatory drugs), alcohol use, or tobacco use. °· Signals from the brain. These signals could be caused by a headache, heat exposure, an inner ear disturbance, increased pressure in the brain from injury, infection, a tumor, or a concussion, pain, emotional stimulus, or metabolic problems. °· An obstruction in the gastrointestinal tract (bowel obstruction). °· Illnesses such as diabetes, hepatitis, gallbladder problems, appendicitis, kidney problems, cancer, sepsis, atypical symptoms of a heart attack, or eating disorders. °· Medical treatments such as chemotherapy and radiation. °· Receiving medicine that makes you sleep (general anesthetic) during surgery. °DIAGNOSIS °Your caregiver may ask for tests to be done if the problems do not improve after a few days. Tests may also be done if symptoms are severe or if the reason for the nausea and vomiting is not clear. Tests may include: °· Urine tests. °· Blood tests. °· Stool tests. °· Cultures (to look for evidence of infection). °· X-rays or other imaging studies. °Test results can help your caregiver make decisions about treatment or the need for additional tests. °TREATMENT °You need to stay well hydrated. Drink frequently but in small amounts. You may  wish to drink water, sports drinks, clear broth, or eat frozen ice pops or gelatin dessert to help stay hydrated. When you eat, eating slowly may help prevent nausea. There are also some antinausea medicines that may help prevent nausea. °HOME CARE INSTRUCTIONS  °· Take all medicine as directed by your caregiver. °· If you do not have an appetite, do not force yourself to eat. However, you must continue to drink fluids. °· If you have an appetite, eat a normal diet unless your caregiver tells you differently. °· Eat a variety of complex carbohydrates (rice, wheat, potatoes, bread), lean meats, yogurt, fruits, and vegetables. °· Avoid high-fat foods because they are more difficult to digest. °· Drink enough water and fluids to keep your urine clear or pale yellow. °· If you are dehydrated, ask your caregiver for specific rehydration instructions. Signs of dehydration may include: °· Severe thirst. °· Dry lips and mouth. °· Dizziness. °· Dark urine. °· Decreasing urine frequency and amount. °· Confusion. °· Rapid breathing or pulse. °SEEK IMMEDIATE MEDICAL CARE IF:  °· You have blood or brown flecks (like coffee grounds) in your vomit. °· You have black or bloody stools. °· You have a severe headache or stiff neck. °· You are   confused. °· You have severe abdominal pain. °· You have chest pain or trouble breathing. °· You do not urinate at least once every 8 hours. °· You develop cold or clammy skin. °· You continue to vomit for longer than 24 to 48 hours. °· You have a fever. °MAKE SURE YOU:  °· Understand these instructions. °· Will watch your condition. °· Will get help right away if you are not doing well or get worse. °Document Released: 05/21/2005 Document Revised: 08/13/2011 Document Reviewed: 10/18/2010 °ExitCare® Patient Information ©2015 [employer]. This information is not intended to replace advice given to you by your health care provider. Make sure you discuss any questions you have with your health  care provider. ° °

## 2014-12-08 NOTE — Progress Notes (Signed)
Pt reports vomiting to staff post administration of medications. Vitals obtained: 123/67 HR 70 100% R/A 99.2 temp. On-call provider contacted. On-call provider instructed to give tylenol q6prn with a dose to be given now. Pt's last dose was at 1956 on 12/07/14 for back pain. This pt refused Tylenol during writer's attempt to administer in fear of vomiting the medication. Pt instructed to contact writer once he feels as if he can tolerate the medication. [place] given to pt to keep at bedside for any emesis that may occur.

## 2014-12-08 NOTE — [facility] Group Notes (Signed)
[facility]  12/08/2014 8:45 AM  Pt did not attend, has been vomiting .   Chad CordialLauren Carter, LCSWA 12/08/2014 10:05 AM

## 2014-12-08 NOTE — Progress Notes (Signed)
Patient ID: Alan Hess, male   DOB: [birth year], 24 y.o.   MRN: [id number] Patient signed in Voluntary.

## 2014-12-08 NOTE — [facility] Group Notes (Signed)
[facility] LCSW Group Therapy 12/08/2014 1:15 PM  Type of Therapy: Group Therapy- Emotion Regulation  Pt did not attend, Pt is on enteric precautions.    Chad CordialLauren Carter, LCSWA 12/08/2014 4:08 PM

## 2014-12-08 NOTE — Progress Notes (Signed)
Patient ID: Alan Hess, male   DOB: September 06, 1990, 24 y.o.   MRN: [id number] D: Patient denies SI/HI and auditory and visual hallucinations.Patient has complained of back pain and anxiety all day. Complained of nausea and vomiting and back pain. Crying in room. Refuses to stay in room. On Enteric precautions. [facility]  A: Patient given emotional support from RN. Patient given medications per MD orders. Patient encouraged to attend groups and unit activities. Patient encouraged to come to staff with any questions or concerns.  R: Patient remains cooperative and appropriate. Will continue to monitor patient for safety.

## 2014-12-08 NOTE — ED Notes (Signed)
[facility].

## 2014-12-08 NOTE — Progress Notes (Signed)
Recreation Therapy Notes  Date: 07.06.16 Time: 930 am Location: [address] Group Room  Group Topic: Stress Management  Goal Area(s) Addresses:  Patient will verbalize importance of using healthy stress management.  Patient will identify positive emotions associated with healthy stress management.   Intervention: Stress Management  Activity :  Guided Imagery.  LRT introduced and educated patients on the stress management technique of guided imagery.  Hess script was used to deliver the technique to patients.  Patients were asked to follow the script read Hess loud by LRT to engage in practicing the stress management technique.  Education:  Stress Management, Discharge Planning.   Education Outcome: Acknowledges edcuation/In group clarification offered/Needs additional education  Clinical Observations/Feedback: Did not attend group.    Alan Hess, LRT/CTRS         Alan Hess 12/08/2014 3:46 PM 

## 2014-12-08 NOTE — Progress Notes (Signed)
Patient ID: Alan Hess, male   DOB: 08-21-1990, 24 y.o.   MRN: [id number] Review of Systems  Constitutional: Positive for fever, chills and diaphoresis.  HENT: Positive for congestion.   Respiratory: Negative for hemoptysis and shortness of breath.   Gastrointestinal: Positive for nausea and vomiting. Negative for diarrhea.  Neurological: Positive for weakness.   Patient states that he started to have some nasal congestion while he was at [facility]  [facility] 2 am last night 67/6/16 he started to develop a fever body aches and pain, chills with nausea and vomiting.  States that he has not been able to keep anything down.  Patient states his is also having abdominal cramping. Patient is in bed with emesis bassen at bed sided filled with brownish emesis.  Patient was given Tylenol which has helped to reduce his fever but patient states that he is still not feeling well.    Will order enteric precautions for this patient and order do not admit order at this time until it is determined nothing to be passed to other patients on floor.    Shuvon B. Rankin FNP-BC   I agree with assessment and plan Madie Renorving A. Dub MikesLugo, M.D.

## 2014-12-08 NOTE — Progress Notes (Signed)
Pt complaining of nasal congestion and "upset stomach"/nausea. Pt was given normal saline to help relieve nasal congestion. Writer obtained an ordered for Zofran q6prn. Pt received Zofran as well as ginger ale to help with nausea. Pt reports some immediate relief with the use of normal saline.

## 2014-12-08 NOTE — [facility] Counselor (Signed)
PSA attempted - patient currently confined to room and on enteric precaution.  PSA deferred until patient is ready for assessment and off precautions.  Santa GeneraAnne Marquet Faircloth, LCSW Clinical Social Worker

## 2014-12-08 NOTE — Progress Notes (Signed)
Patient ID: Alan Hess, male   DOB: 04/14/1991, 24 y.o.   MRN: [id number] [facility]. [place] called. [facility] toaccompany patient.

## 2014-12-08 NOTE — Treatment Plan (Signed)
Spoke with Judyann Munsonynthia Snider with Infectious Disease who recommends staying on enteric precautions until at least tomorrow around noon depending on how pt is doing and after contacting Infectious Disease with a follow-up.  Please use [email or web address] to find who is taking calls for Infectious Disease on 7/7.
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
## 2014-12-08 NOTE — ED Notes (Signed)
Pt from [facility] c/o back pain, emesis. 

## 2014-12-09 NOTE — [facility] Group Notes (Signed)
[facility] Group Notes:  (Nursing/MHT/Case Management/Adjunct)  Date:  12/09/2014  Time:  0900am  Type of Therapy:  Nurse Education  Participation Level:  Did Not Attend  Participation Quality:  Did not attend  Affect:  Did not attend  Cognitive:  Did not attend  Insight:  None  Engagement in Group:  Did not attend  Modes of Intervention:  Discussion, Education and Support  Summary of Progress/Problems: Patient unable to attend group this morning d/t enteric precaution order at this time.  Lendell CapriceGuthrie, Kaylah Chiasson A 12/09/2014, 9:36 AM

## 2014-12-09 NOTE — [facility] Group Notes (Signed)
[facility] LCSW Group Therapy 12/09/2014  1:15 pm   Type of Therapy: Group Therapy Participation Level: Active  Participation Quality: Attentive, Sharing   Affect: Anxious yet Flat  Cognitive: Alert and Oriented  Insight: Developing  Engagement in Therapy: Developing  Modes of Intervention: Clarification, Confrontation, Discussion, Education, Exploration, Limit-setting, Orientation, Problem-solving, Rapport Building, Dance movement psychotherapisteality Testing, Socialization and Support  Summary of Progress/Problems: The topic for group was balance in life. Today's group focused on defining balance in one's own words, identifying things that can knock one off balance, and exploring healthy ways to maintain balance in life. Group members were asked to provide an example of a time when they felt off balance, describe how they handled that situation,and process healthier ways to regain balance in the future. Group members were asked to share the most important tool for maintaining balance that they learned [facility] and how they plan to apply this method after discharge. Patient reports he has many areas of his life that are out of balance due to working one full time and one part time job. He wants to spend more time with family. Pt considering asking a family member to help him be accountable for medication compliance.    Carney Bernatherine C Tuere Nwosu, LCSW
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
## 2014-12-09 NOTE — Progress Notes (Signed)
Pt was invited to [place], however chose not to attend and stayed in his room.
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
## 2014-12-09 NOTE — Progress Notes (Signed)
D: Patient is alert and oriented. Pt's mood and affect is pleasant and blunted. Pt denies SI/HI and AVH. Pt is tachycardic upon standing, denies symptoms. Pt rates depression and anxiety both 3/10, hopelessness 1/10. Pt reports his goal for the day is "to start a plan to get home with my family again." Pt C/O nicotine craving/smoking cessation throughout the day with relief from PRN medication. When RN assessing pt's bulging lip, pt states "It's that gum" referring to nicorette gum, RN asked pt to open mouth and pt willingly opened mouth, tobacco visible, pt willingly spit out tobacco, pt states "It came in with me," denies having visitors. Pt requests to meet with the provider in regards to participating with his discharge plan. Pt requesting increase in ADHD medication dosage. Pt C/O anxiety this evening. Pt is attending some unit groups. A: Active listening by RN. Encouragement/Support provided to pt. Pt remains on Enteric Precaution per providers orders until D/C order placed by provider at 1303 today. RN called Infection prevention to discuss pt's precaution status, left message at 1100am to discuss precautions, AC Tina T. Later spoke with Infection prevention staff. Pt willingly disposed of tobacco, additional room/environmental search was completed, pants with string placed in pt's locker, no additional tobacco found in pt's room or in belongings, Charge RN Patty and MD Dub MikesLugo made aware of events, MHT Francis DowseJoel present/witness during event. Hospital policies reviewed with pt. Medication education reviewed with pt. PRN medication administered for smoking cessation and anxiety per providers orders (See MAR). Scheduled medications administered per providers orders (See MAR). 15 minute checks continued per protocol for patient safety.  R: Patient cooperative and receptive to nursing interventions. Pt remains safe. Pt unable to attend some unit groups d/t enteric precaution order. Pt non-adherent to hospital policies  today.

## 2014-12-09 NOTE — Progress Notes (Signed)
[facility] MD Progress Note  12/09/2014 11:38 PM Alan Hess  MRN:  [id number] Subjective:  States that he feels better today. Thinks it was a "bug" he had. He is now feeling down depressed. He states he has thought about taking 3 Adderall ( every 4 hours) but he never did it. Continues to deny that he overtook it. States he needs to go as he is afraid to lose his job. Cant validate what his mother is saying that if he is released he will kill himself Principal Problem: Amphetamine withdrawal Diagnosis:   Patient Active Problem List   Diagnosis Date Noted  . Acute stress disorder [F43.9] 12/07/2014  . 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 Attention deficit hyperactivity disorder (ADHD), combined type [F90.2]   . ADHD (attention deficit hyperactivity disorder) [F90.9] 10/07/2014  . PTSD (post-traumatic stress disorder) [F43.10] 10/07/2014  . Substance abuse [F19.10] 10/07/2014  . Seasonal allergies [J30.2]   . ADD (attention deficit disorder) [F90.9]    Total Time spent with patient: 30 minutes   Past Medical History:  Past Medical History  Diagnosis Date  . Seasonal allergies   . ADD (attention deficit disorder)   . Depression   . Anxiety    History reviewed. No pertinent past surgical history. Family History:  Family History  Problem Relation Age of Onset  . Cancer Other   . Diabetes Other    Social History:  History  Alcohol Use No     History  Drug Use  . Yes  . Special: Cocaine    Comment: pt denies    History   Social History  . Marital Status: Single    Spouse Name: N/A  . Number of Children: N/A  . Years of Education: N/A   Social History Main Topics  . Smoking status: Never Smoker   . Smokeless tobacco: Current User    Types: Chew, Snuff  . Alcohol Use: No  . Drug Use: Yes    Special: Cocaine     Comment: pt denies  . Sexual Activity: Yes    Birth Control/ Protection: Condom   Other Topics Concern  . None   Social History Narrative    Additional History:    Sleep: Poor  Appetite:  Poor   Assessment:   Musculoskeletal: Strength & Muscle Tone: within normal limits Gait & Station: normal Patient leans: normal   Psychiatric Specialty Exam: Physical Exam  Review of Systems  Constitutional: Positive for malaise/fatigue.  HENT: Negative.   Eyes: Negative.   Respiratory: Negative.   Cardiovascular: Negative.   Gastrointestinal: Negative.   Musculoskeletal: Negative.   Skin: Negative.   Neurological: Positive for weakness.  Endo/Heme/Allergies: Negative.   Psychiatric/Behavioral: Positive for depression. The patient is nervous/anxious and has insomnia.     Blood pressure 121/72, pulse 113, temperature 98 F (36.7 C), temperature source Oral, resp. rate 16, height 5' 9"  (1.753 m), weight 49.896 kg (110 lb), SpO2 100 %.Body mass index is 16.24 kg/(m^2).  General Appearance: Fairly Groomed  Engineer, water::  Fair  Speech:  Clear and Coherent  Volume:  Decreased  Mood:  Depressed  Affect:  Depressed and Restricted  Thought Process:  Coherent and Goal Directed  Orientation:  Full (Time, Place, and Person)  Thought Content:  symptoms events worries concerns  Suicidal Thoughts:  No  Homicidal Thoughts:  No  Memory:  Immediate;   Fair Recent;   Fair Remote;   Fair  Judgement:  Fair  Insight:  Present  Psychomotor Activity:  Decreased  Concentration:  Fair  Recall:  [employer] of Knowledge:Fair  Language: Fair  Akathisia:  No  Handed:  Right  AIMS (if indicated):     Assets:  Desire for Improvement  ADL's:  Intact  Cognition: WNL  Sleep:  Number of Hours: 4.5     Current Medications: Current Facility-Administered Medications  Medication Dose Route Frequency Provider Last Rate Last Dose  . acetaminophen (TYLENOL) tablet 650 mg  650 mg Oral Q6H PRN Laverle Hobby, PA-C   650 mg at 12/08/14 6226  . alum & mag hydroxide-simeth (MAALOX/MYLANTA) 200-200-20 MG/5ML suspension 30 mL  30 mL Oral Q4H PRN  Harriet Butte, NP      . amphetamine-dextroamphetamine (ADDERALL) tablet 20 mg  20 mg Oral BID WC Nicholaus Bloom, MD   20 mg at 12/09/14 1021  . feeding supplement (ENSURE ENLIVE) (ENSURE ENLIVE) liquid 237 mL  237 mL Oral TID BM Nicholaus Bloom, MD   237 mL at 12/09/14 1953  . hydrOXYzine (ATARAX/VISTARIL) tablet 50 mg  50 mg Oral Q6H PRN Harriet Butte, NP   50 mg at 12/08/14 1200  . ibuprofen (ADVIL,MOTRIN) tablet 600 mg  600 mg Oral Q6H PRN Kerrie Buffalo, NP      . LORazepam (ATIVAN) tablet 1 mg  1 mg Oral Q4H PRN Nicholaus Bloom, MD   1 mg at 12/09/14 2110  . magnesium hydroxide (MILK OF MAGNESIA) suspension 30 mL  30 mL Oral Daily PRN Harriet Butte, NP      . methocarbamol (ROBAXIN) tablet 500 mg  500 mg Oral Q6H PRN Nicholaus Bloom, MD   500 mg at 12/08/14 1452  . nicotine polacrilex (NICORETTE) gum 2 mg  2 mg Oral PRN Nicholaus Bloom, MD   2 mg at 12/09/14 1952  . ondansetron (ZOFRAN-ODT) disintegrating tablet 4 mg  4 mg Oral Q6H PRN Laverle Hobby, PA-C   4 mg at 12/08/14 1535  . traZODone (DESYREL) tablet 100 mg  100 mg Oral QHS,MR X 1 Nicholaus Bloom, MD   100 mg at 12/07/14 2108    Lab Results:  Results for orders placed or performed during the hospital encounter of 12/06/14 (from the past 48 hour(s))  Comprehensive metabolic panel     Status: Abnormal   Collection Time: 12/08/14  6:17 PM  Result Value Ref Range   Sodium 141 135 - 145 mmol/L   Potassium 3.6 3.5 - 5.1 mmol/L   Chloride 99 (L) 101 - 111 mmol/L   CO2 24 22 - 32 mmol/L   Glucose, Bld 129 (H) 65 - 99 mg/dL   BUN 19 6 - 20 mg/dL   Creatinine, Ser 1.15 0.61 - 1.24 mg/dL   Calcium 10.9 (H) 8.9 - 10.3 mg/dL   Total Protein 9.0 (H) 6.5 - 8.1 g/dL   Albumin 5.8 (H) 3.5 - 5.0 g/dL   AST 21 15 - 41 U/L   ALT 13 (L) 17 - 63 U/L   Alkaline Phosphatase 62 38 - 126 U/L   Total Bilirubin 2.7 (H) 0.3 - 1.2 mg/dL   GFR calc non Af Amer >60 >60 mL/min   GFR calc Af Amer >60 >60 mL/min    Comment: (NOTE) The eGFR has been  calculated using the CKD EPI equation. This calculation has not been validated in all clinical situations. eGFR's persistently <60 mL/min signify possible Chronic Kidney Disease.    Anion gap 18 (H) 5 - 15  Lipase, blood  Status: Abnormal   Collection Time: 12/08/14  6:17 PM  Result Value Ref Range   Lipase 19 (L) 22 - 51 U/L  CBC with Differential     Status: Abnormal   Collection Time: 12/08/14  6:17 PM  Result Value Ref Range   WBC 12.1 (H) 4.0 - 10.5 K/uL   RBC 5.69 4.22 - 5.81 MIL/uL   Hemoglobin 17.3 (H) 13.0 - 17.0 g/dL   HCT 50.0 39.0 - 52.0 %   MCV 87.9 78.0 - 100.0 fL   MCH 30.4 26.0 - 34.0 pg   MCHC 34.6 30.0 - 36.0 g/dL   RDW 12.9 11.5 - 15.5 %   Platelets 340 150 - 400 K/uL   Neutrophils Relative % 80 (H) 43 - 77 %   Neutro Abs 9.6 (H) 1.7 - 7.7 K/uL   Lymphocytes Relative 10 (L) 12 - 46 %   Lymphs Abs 1.3 0.7 - 4.0 K/uL   Monocytes Relative 10 3 - 12 %   Monocytes Absolute 1.2 (H) 0.1 - 1.0 K/uL   Eosinophils Relative 0 0 - 5 %   Eosinophils Absolute 0.0 0.0 - 0.7 K/uL   Basophils Relative 0 0 - 1 %   Basophils Absolute 0.0 0.0 - 0.1 K/uL  Urinalysis, Routine w reflex microscopic (not at [facility])     Status: Abnormal   Collection Time: 12/08/14  7:46 PM  Result Value Ref Range   Color, Urine AMBER (A) YELLOW    Comment: BIOCHEMICALS MAY BE AFFECTED BY COLOR   APPearance CLOUDY (A) CLEAR   Specific Gravity, Urine 1.034 (H) 1.005 - 1.030   pH 8.5 (H) 5.0 - 8.0   Glucose, UA NEGATIVE NEGATIVE mg/dL   Hgb urine dipstick NEGATIVE NEGATIVE   Bilirubin Urine NEGATIVE NEGATIVE   Ketones, ur NEGATIVE NEGATIVE mg/dL   Protein, ur 100 (A) NEGATIVE mg/dL   Urobilinogen, UA 1.0 0.0 - 1.0 mg/dL   Nitrite NEGATIVE NEGATIVE   Leukocytes, UA NEGATIVE NEGATIVE  Urine microscopic-add on     Status: Abnormal   Collection Time: 12/08/14  7:46 PM  Result Value Ref Range   Squamous Epithelial / LPF RARE RARE   WBC, UA 0-2 <3 WBC/hpf   RBC / HPF 3-6 <3 RBC/hpf   Bacteria,  UA FEW (A) RARE   Casts HYALINE CASTS (A) NEGATIVE   Urine-Other MUCOUS PRESENT     Physical Findings: AIMS: Facial and Oral Movements Muscles of Facial Expression: None, normal Lips and Perioral Area: None, normal Jaw: None, normal Tongue: None, normal,Extremity Movements Upper (arms, wrists, hands, fingers): None, normal Lower (legs, knees, ankles, toes): None, normal, Trunk Movements Neck, shoulders, hips: None, normal, Overall Severity Severity of abnormal movements (highest score from questions above): None, normal Incapacitation due to abnormal movements: None, normal Patient's awareness of abnormal movements (rate only patient's report): No Awareness, Dental Status Current problems with teeth and/or dentures?: No Does patient usually wear dentures?: No  CIWA:    COWS:     Treatment Plan Summary: Daily contact with patient to assess and evaluate symptoms and progress in treatment and Medication management Supportive approach/coping skills ADHD; optimize response to the Adderall Depression; reassess for the need of an antidepressant CBT/mindfulness Medical Decision Making:  Review of Psycho-Social Stressors (1) and Review of Medication Regimen & Side Effects (2)     Kienan Doublin A 12/09/2014, 11:38 PM

## 2014-12-09 NOTE — Progress Notes (Signed)
Followed up with Laura-Infection Prevention RN and Dr Drue SecondSnider- Infectious disease. Contact precaution discontinued.

## 2014-12-09 NOTE — Plan of Care (Signed)
Problem: Ineffective individual coping Goal: STG: Patient will remain free from self harm Outcome: Progressing Patient remains free from self harm. 15 minute checks continued per protocol for patient safety.   Problem: Diagnosis: Increased Risk For Suicide Attempt Goal: STG-Patient Will Attend All Groups On The Unit Outcome: Not Applicable Date Met:  24/46/28 Pt unable to attend all unit groups today d/t enteric precaution. Pt did attend one group after precaution was discontinued this afternoon. Goal: STG-Patient Will Comply With Medication Regime Outcome: Progressing Patient has adhered to medication regimen today with ease.  Problem: Alteration in mood Goal: LTG-Patient reports reduction in suicidal thoughts (Patient reports reduction in suicidal thoughts and is able to verbalize a safety plan for whenever patient is feeling suicidal)  Outcome: Progressing Patient denies having any suicidal thoughts today.

## 2014-12-10 MED ORDER — AMPHETAMINE-DEXTROAMPHET ER 10 MG PO CP24
20.0000 mg | ORAL_CAPSULE | Freq: Once | ORAL | Status: AC
  Administered 2014-12-10: 20 mg via ORAL
  Filled 2014-12-10: qty 2

## 2014-12-10 MED ORDER — MIRTAZAPINE 15 MG PO TABS
15.0000 mg | ORAL_TABLET | Freq: Every day | ORAL | Status: DC
  Administered 2014-12-10 – 2014-12-12 (×3): 15 mg via ORAL
  Filled 2014-12-10 (×4): qty 1
  Filled 2014-12-10: qty 3

## 2014-12-10 MED ORDER — AMPHETAMINE-DEXTROAMPHETAMINE 10 MG PO TABS
20.0000 mg | ORAL_TABLET | Freq: Every day | ORAL | Status: DC
  Administered 2014-12-11 – 2014-12-12 (×2): 20 mg via ORAL
  Filled 2014-12-10 (×2): qty 2

## 2014-12-10 NOTE — Clinical Social Work Note (Signed)
[facility], Latoyia Miller at [phone].  Santa GeneraAnne Cunningham, LCSW Clinical Social Worker

## 2014-12-10 NOTE — Tx Team (Signed)
Interdisciplinary Treatment Plan Update (Adult)  Date:  12/10/2014 Time Reviewed:  8:55 AM  Progress in Treatment: Attending groups: Yes. Participating in groups:  Yes. Taking medication as prescribed:  Yes. Tolerating medication:  Yes. Family/Significant othe contact made:  No, will contact:  will contact w patient permission Patient understands diagnosis:  No, patient continues to seek stimulant medication  Discussing patient identified problems/goals with staff:  Yes. and No. Medical problems stabilized or resolved:  Yes. Denies suicidal/homicidal ideation: Yes. Issues/concerns per patient self-inventory:  No. Other:  New problem(s) identified: Yes, Describe:  stimulant seeking, no insurance for outpatient care  Discharge Plan or Barriers:  Lack of insurance  Reason for Continuation of Hospitalization: Anxiety Medication stabilization Withdrawal symptoms    Estimated length of stay: 3 - 5 days  New goal(s):  Address patient concern w stimulant medication, has no insurance coverage; has been assigned care coordinator w [facility] who can assist w referrals.   Review of initial/current patient goals per problem list:   See intiial care plans  Attendees: Patient:   7/8/20168:55 AM  Family:   7/8/20168:55 AM  Physician:  Jola BaptistI Lugo MD 7/8/20168:55 AM  Nursing:   Sue LushAndrea RN 7/8/20168:55 AM  Case Manager:  Santa GeneraAnne Derrico Zhong, LCSW 7/8/20168:55 AM  Counselor:   7/8/20168:55 AM  Other:  Tedra CoupeV Enoch Monarch TCT 7/8/20168:55 AM  Other:  Marilynn LatinoJ Clark,RN UR 7/8/20168:55 AM  Other:   7/8/20168:55 AM  Other:  7/8/20168:55 AM  Other:  7/8/20168:55 AM  Other:  7/8/20168:55 AM  Other:  7/8/20168:55 AM  Other:  7/8/20168:55 AM  Other:  7/8/20168:55 AM  Other:   7/8/20168:55 AM   Scribe for Treatment Team:   Sallee Langeunningham, Skippy Marhefka C, 12/10/2014, 8:55 AM

## 2014-12-10 NOTE — Progress Notes (Signed)
Pt attended the evening AA speaker meeting. 

## 2014-12-10 NOTE — Progress Notes (Signed)
[facility]  12/10/2014 9:05 PM Alan Hess  MRN:  [id number] Subjective:  States that the Adderall makes a big difference when it comes to his ability to function. States that the 20 mg TID has kept calm focused able to interact with no agitation what so ever. Asked to be kept on the three a day. Concerned about his ability to keep his job if he does not have this help. State he needs to work Principal Problem: Amphetamine withdrawal Diagnosis:   Patient Active Problem List   Diagnosis Date Noted  . Acute stress disorder [F43.9] 12/07/2014  . 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 Attention deficit hyperactivity disorder (ADHD), combined type [F90.2]   . ADHD (attention deficit hyperactivity disorder) [F90.9] 10/07/2014  . PTSD (post-traumatic stress disorder) [F43.10] 10/07/2014  . Substance abuse [F19.10] 10/07/2014  . Seasonal allergies [J30.2]   . ADD (attention deficit disorder) [F90.9]    Total Time spent with patient: 30 minutes   Past Medical History:  Past Medical History  Diagnosis Date  . Seasonal allergies   . ADD (attention deficit disorder)   . Depression   . Anxiety    History reviewed. No pertinent past surgical history. Family History:  Family History  Problem Relation Age of Onset  . Cancer Other   . Diabetes Other    Social History:  History  Alcohol Use No     History  Drug Use  . Yes  . Special: Cocaine    Comment: pt denies    History   Social History  . Marital Status: Single    Spouse Name: N/A  . Number of Children: N/A  . Years of Education: N/A   Social History Main Topics  . Smoking status: Never Smoker   . Smokeless tobacco: Current User    Types: Chew, Snuff  . Alcohol Use: No  . Drug Use: Yes    Special: Cocaine     Comment: pt denies  . Sexual Activity: Yes    Birth Control/ Protection: Condom   Other Topics Concern  . None   Social History Narrative   Additional History:    Sleep:  Fair  Appetite:  Fair   Assessment:   Musculoskeletal: Strength & Muscle Tone: within normal limits Gait & Station: normal Patient leans: normal   Psychiatric Specialty Exam: Physical Exam  Review of Systems  Constitutional: Negative.   HENT: Negative.   Eyes: Negative.   Respiratory: Negative.   Cardiovascular: Negative.   Gastrointestinal: Negative.   Genitourinary: Negative.   Musculoskeletal: Negative.   Skin: Negative.   Endo/Heme/Allergies: Negative.   Psychiatric/Behavioral: The patient is nervous/anxious.     Blood pressure 113/77, pulse 115, temperature 98.1 F (36.7 C), temperature source Oral, resp. rate 16, height 5\' 9"  (1.753 m), weight 49.896 kg (110 lb), SpO2 100 %.Body mass index is 16.24 kg/(m^2).  General Appearance: Fairly Groomed  Patent attorneyye Contact::  Fair  Speech:  Clear and Coherent  Volume:  Normal  Mood:  Euthymic  Affect:  Appropriate  Thought Process:  Coherent and Goal Directed  Orientation:  Full (Time, Place, and Person)  Thought Content:  symptoms worries concerns  Suicidal Thoughts:  No  Homicidal Thoughts:  No  Memory:  Immediate;   Fair Recent;   Fair Remote;   Fair  Judgement:  Fair  Insight:  Present  Psychomotor Activity:  Normal  Concentration:  Fair  Recall:  [employer]  Fund of Knowledge:Fair  Language: Fair  Akathisia:  No  Handed:  Right  AIMS (if indicated):     Assets:  Desire for Improvement Housing Social Support  ADL's:  Intact  Cognition: WNL  Sleep:  Number of Hours: 6.25     Current Medications: Current Facility-Administered Medications  Medication Dose Route Frequency Provider Last Rate Last Dose  . acetaminophen (TYLENOL) tablet 650 mg  650 mg Oral Q6H PRN Kerry Hough, PA-C   650 mg at 12/08/14 1610  . alum & mag hydroxide-simeth (MAALOX/MYLANTA) 200-200-20 MG/5ML suspension 30 mL  30 mL Oral Q4H PRN Worthy Flank, NP      . amphetamine-dextroamphetamine (ADDERALL) tablet 20 mg  20 mg Oral BID WC Rachael Fee, MD   20 mg at 12/10/14 1055  . feeding supplement (ENSURE ENLIVE) (ENSURE ENLIVE) liquid 237 mL  237 mL Oral TID BM Rachael Fee, MD   237 mL at 12/10/14 1950  . hydrOXYzine (ATARAX/VISTARIL) tablet 50 mg  50 mg Oral Q6H PRN Worthy Flank, NP   50 mg at 12/08/14 1200  . ibuprofen (ADVIL,MOTRIN) tablet 600 mg  600 mg Oral Q6H PRN Adonis Brook, NP      . LORazepam (ATIVAN) tablet 1 mg  1 mg Oral Q4H PRN Rachael Fee, MD   1 mg at 12/10/14 1718  . magnesium hydroxide (MILK OF MAGNESIA) suspension 30 mL  30 mL Oral Daily PRN Worthy Flank, NP      . methocarbamol (ROBAXIN) tablet 500 mg  500 mg Oral Q6H PRN Rachael Fee, MD   500 mg at 12/08/14 1452  . mirtazapine (REMERON) tablet 15 mg  15 mg Oral QHS Rachael Fee, MD      . nicotine polacrilex (NICORETTE) gum 2 mg  2 mg Oral PRN Rachael Fee, MD   2 mg at 12/10/14 2007  . ondansetron (ZOFRAN-ODT) disintegrating tablet 4 mg  4 mg Oral Q6H PRN Kerry Hough, PA-C   4 mg at 12/08/14 1535    Lab Results: No results found for this or any previous visit (from the past 48 hour(s)).  Physical Findings: AIMS: Facial and Oral Movements Muscles of Facial Expression: None, normal Lips and Perioral Area: None, normal Jaw: None, normal Tongue: None, normal,Extremity Movements Upper (arms, wrists, hands, fingers): None, normal Lower (legs, knees, ankles, toes): None, normal, Trunk Movements Neck, shoulders, hips: None, normal, Overall Severity Severity of abnormal movements (highest score from questions above): None, normal Incapacitation due to abnormal movements: None, normal Patient's awareness of abnormal movements (rate only patient's report): No Awareness, Dental Status Current problems with teeth and/or dentures?: No Does patient usually wear dentures?: No  CIWA:    COWS:     Treatment Plan Summary: Daily contact with patient to assess and evaluate symptoms and progress in treatment and Medication management Supportive  approach/coping skills Inattentiveness/distractibility; will increase the Adderall to 20 mg TID. He does seem to need this medication and this dosage to function (at the [facility] clinic he is being help to develop strategies not to have to depend on the medication but right now he seems to need the higher dose) Anxiety; will continue to work with CBT/minfulness  Medical Decision Making:  Review of Psycho-Social Stressors (1) and Review of Medication Regimen & Side Effects (2)     Meghna Hagmann A 12/10/2014, 9:05 PM

## 2014-12-10 NOTE — Clinical Social Work Note (Signed)
CSW requested help from [facility] Provider ACCESS line for hospital discharge appts - left VM requesting assistance in scheduling follow up care.  Santa GeneraAnne Treina Arscott, LCSW Clinical Social Worker

## 2014-12-10 NOTE — Progress Notes (Signed)
D) Pt. C/o anxiety, stating he cannot tolerate being in large crowds.  Refused to go to meals, but would eat on unit.  Pt. Is very medication seeking. Using PRN ativan and nicorette gum at every possible interval, and asked for additional adderall dose stating he is used to taking it 3 times per day. A) emotional support offered.  Medicated per orders.  Encouraged to use other coping skills in addition to medication.  R) Pt. Receptive, but continues needy.  Safe at this time and remains on q15 min .observations.

## 2014-12-10 NOTE — Progress Notes (Signed)
Pt reports he feels about the same as he has for the last few days.  He has not mention any pain to Clinical research associatewriter.  He denies SI/HI/AVH.  He frequently asks for the prn Ativan and nicorette gum, even if it is too early and each time wants to know why he has to wait.  Pt is not going to groups.  He stays to himself and isolates to his room most of the time.  Pt was encouraged to look for other ways to cope with his anxiety other than reaching for a pill.  Pt does not seem interested in writer's advise.  Support and encouragement offered.  Pt makes his needs known to staff.  Safety maintained with q15 minute checks.

## 2014-12-10 NOTE — Progress Notes (Signed)
Pt has spent most of the shift in his room.  He did not attend evening karaoke group.  He denies SI/HI/AVH.  He is upset that his mother called and said she is afraid he is still suicidal and thinks he needs to stay longer.  He says he is ready to discharge because he needs to get back to work.  He has not mentioned any back pain, nor has he c/o nausea this evening.  He does regularly ask for Ativan for anxiety which he can have every 4 hrs prn.  He tries to get it early, but staff keeps reminding him that he cannot have the med until after the full 4 hrs have passed.  Pt plans to return home at discharge.  He makes his needs known to staff.  Support and encouragement offered.  Safety maintained with q15 minute checks.

## 2014-12-11 ENCOUNTER — Encounter (HOSPITAL_COMMUNITY): Payer: Self-pay | Admitting: Registered Nurse

## 2014-12-11 DIAGNOSIS — F1190 Opioid use, unspecified, uncomplicated: Secondary | ICD-10-CM | POA: Insufficient documentation

## 2014-12-11 DIAGNOSIS — F419 Anxiety disorder, unspecified: Secondary | ICD-10-CM

## 2014-12-11 DIAGNOSIS — F1593 Other stimulant use, unspecified with withdrawal: Secondary | ICD-10-CM

## 2014-12-11 LAB — RAPID URINE DRUG SCREEN, HOSP PERFORMED
Amphetamines: NOT DETECTED
BARBITURATES: NOT DETECTED
BENZODIAZEPINES: POSITIVE — AB
COCAINE: NOT DETECTED
Opiates: NOT DETECTED
TETRAHYDROCANNABINOL: NOT DETECTED

## 2014-12-11 NOTE — Progress Notes (Signed)
[facility]  12/11/2014 3:09 PM Prudencio BurlySamuel L Neuwirth  MRN:  [id number]   Subjective:  Patient states that he is doing a lot better on the Adderal.  "I just knew that there was something was missing to balance me out."   Objective:  Patient states that he is tolerating his medications without adverse effects.  Attending /Participating in group sessions.   Patient took medications that was given to him by another patient.  Staff overheard patients arguing over the medication exchange and reported.  Patient states the following: "I was standing in line to get my medicine like I usually do when the man came up to me ans said I like your shoes; and he asked me if I would take a couple of pills for them.  Since my back had been hurting and I hadn't said nothing to nobody; I told him yea.  I told him to give me five but he only gave me three.  He took the shoes.  He told me he was going to kick my ass in the street.  Me and him was defiantly going to get into it; He would have tried to fight me.  I did get my shoes back.  I walked in his room and got them.   Patient states that he was given morphine when he was at the emergency room and hasn't been given anything for pain since has [facility] since he hasn't asked for pain medication.  States when he noticed the other patient was being discharged is when he walked into the other patients room and got his shoes.   Also discussed with patient taking medications that are not prescribed to him while taking medications like Adderall.    Patient states Dr. Dub MikesLugo told me he wasn't going to write me a prescription for the Adderall any way; [facility]  I didn't want him to write me a prescription no way.  I just wanted to stay here as long as I could as close as I could to my next fill date.  But I wanted to know if someone could write me a prescription for five or ten (referring to Adderall) to get me to Friday."    Principal Problem: Amphetamine  withdrawal Diagnosis:   Patient Active Problem List   Diagnosis Date Noted  . Acute stress disorder [F43.9] 12/07/2014  . 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 Attention deficit hyperactivity disorder (ADHD), combined type [F90.2]   . ADHD (attention deficit hyperactivity disorder) [F90.9] 10/07/2014  . PTSD (post-traumatic stress disorder) [F43.10] 10/07/2014  . Substance abuse [F19.10] 10/07/2014  . Seasonal allergies [J30.2]   . ADD (attention deficit disorder) [F90.9]    Total Time spent with patient: 30 minutes   Past Medical History:  Past Medical History  Diagnosis Date  . Seasonal allergies   . ADD (attention deficit disorder)   . Depression   . Anxiety    History reviewed. No pertinent past surgical history. Family History:  Family History  Problem Relation Age of Onset  . Cancer Other   . Diabetes Other    Social History:  History  Alcohol Use No     History  Drug Use  . Yes  . Special: Cocaine    Comment: pt denies    History   Social History  . Marital Status: Single    Spouse Name: N/A  . Number of Children: N/A  . Years of Education: N/A  Social History Main Topics  . Smoking status: Never Smoker   . Smokeless tobacco: Current User    Types: Chew, Snuff  . Alcohol Use: No  . Drug Use: Yes    Special: Cocaine     Comment: pt denies  . Sexual Activity: Yes    Birth Control/ Protection: Condom   Other Topics Concern  . None   Social History Narrative   Additional History:    Sleep: Fair, "Last night first night I slept all the way through"  Appetite:  Fair, Improving   Assessment:   Musculoskeletal: Strength & Muscle Tone: within normal limits Gait & Station: normal Patient leans: normal   Psychiatric Specialty Exam: Physical Exam  Review of Systems  Constitutional: Negative.   HENT: Negative.   Eyes: Negative.   Respiratory: Negative.   Cardiovascular: Negative.   Gastrointestinal: Negative.   Genitourinary:  Negative.   Musculoskeletal: Negative.   Skin: Negative.   Endo/Heme/Allergies: Negative.   Psychiatric/Behavioral: The patient is nervous/anxious.     Blood pressure 111/71, pulse 120, temperature 98.6 F (37 C), temperature source Oral, resp. rate 18, height  (1.753 m), weight 49.896 kg (110 lb), SpO2 100 %.Body mass index is 16.24 kg/(m^2).  General Appearance: Fairly Groomed  Patent attorney::  Fair  Speech:  Clear and Coherent  Volume:  Normal  Mood:  Euthymic  Affect:  Appropriate  Thought Process:  Coherent and Goal Directed  Orientation:  Full (Time, Place, and Person)  Thought Content:  symptoms worries concerns  Suicidal Thoughts:  No  Homicidal Thoughts:  No  Memory:  Immediate;   Fair Recent;   Fair Remote;   Fair  Judgement:  Poor  Insight:  Present  Psychomotor Activity:  Normal  Concentration:  Fair  Recall:  [employer] of Knowledge:Fair  Language: Fair  Akathisia:  No  Handed:  Right  AIMS (if indicated):     Assets:  Desire for Improvement Housing Social Support  ADL's:  Intact  Cognition: WNL  Sleep:  Number of Hours: 6.25     Current Medications: Current Facility-Administered Medications  Medication Dose Route Frequency Provider Last Rate Last Dose  . acetaminophen (TYLENOL) tablet 650 mg  650 mg Oral Q6H PRN Kerry Hough, PA-C   650 mg at 12/08/14 9604  . alum & mag hydroxide-simeth (MAALOX/MYLANTA) 200-200-20 MG/5ML suspension 30 mL  30 mL Oral Q4H PRN Worthy Flank, NP      . amphetamine-dextroamphetamine (ADDERALL) tablet 20 mg  20 mg Oral BID WC Rachael Fee, MD   20 mg at 12/11/14 1500  . amphetamine-dextroamphetamine (ADDERALL) tablet 20 mg  20 mg Oral Daily Rachael Fee, MD      . feeding supplement (ENSURE ENLIVE) (ENSURE ENLIVE) liquid 237 mL  237 mL Oral TID BM Rachael Fee, MD   237 mL at 12/11/14 1400  . hydrOXYzine (ATARAX/VISTARIL) tablet 50 mg  50 mg Oral Q6H PRN Worthy Flank, NP   50 mg at 12/08/14 1200  . ibuprofen  (ADVIL,MOTRIN) tablet 600 mg  600 mg Oral Q6H PRN Adonis Brook, NP      . LORazepam (ATIVAN) tablet 1 mg  1 mg Oral Q4H PRN Rachael Fee, MD   1 mg at 12/11/14 1148  . magnesium hydroxide (MILK OF MAGNESIA) suspension 30 mL  30 mL Oral Daily PRN Worthy Flank, NP      . methocarbamol (ROBAXIN) tablet 500 mg  500 mg Oral Q6H PRN Madie Reno  Jorja Loa, MD   500 mg at 12/08/14 1452  . mirtazapine (REMERON) tablet 15 mg  15 mg Oral QHS Rachael Fee, MD   15 mg at 12/10/14 2122  . nicotine polacrilex (NICORETTE) gum 2 mg  2 mg Oral PRN Rachael Fee, MD   2 mg at 12/11/14 1149  . ondansetron (ZOFRAN-ODT) disintegrating tablet 4 mg  4 mg Oral Q6H PRN Kerry Hough, PA-C   4 mg at 12/08/14 1535    Lab Results: No results found for this or any previous visit (from the past 48 hour(s)).  Physical Findings: AIMS: Facial and Oral Movements Muscles of Facial Expression: None, normal Lips and Perioral Area: None, normal Jaw: None, normal Tongue: None, normal,Extremity Movements Upper (arms, wrists, hands, fingers): None, normal Lower (legs, knees, ankles, toes): None, normal, Trunk Movements Neck, shoulders, hips: None, normal, Overall Severity Severity of abnormal movements (highest score from questions above): None, normal Incapacitation due to abnormal movements: None, normal Patient's awareness of abnormal movements (rate only patient's report): No Awareness, Dental Status Current problems with teeth and/or dentures?: No Does patient usually wear dentures?: No  CIWA:    COWS:     Treatment Plan Summary: Daily contact with patient to assess and evaluate symptoms and progress in treatment and Medication management Supportive approach/coping skills Inattentiveness/distractibility; will increase the Adderall to 20 mg TID. He does seem to need this medication and this dosage to function (at the [facility] clinic he is being help to develop strategies not to have to depend on the medication but right now  he seems to need the higher dose) Anxiety; will continue to work with CBT/minfulness  UDS ordered to screen for opiates.  Will continue with current treatment plan at this time.   Medical Decision Making:  Review of Psycho-Social Stressors (1) and Review of Medication Regimen & Side Effects (2)   Riki Berninger, FNP-BC 12/11/2014, 3:09 PM

## 2014-12-11 NOTE — Progress Notes (Signed)
D.  Pt anxious on approach, requesting prn Ativan.  Pt denies SI/HI/hallucinatons at this time.  Pt received Ativan at 2000 then requested it again at 2100 to be taken with his nighttime sleep medications.  Explained to Pt that medication is ordered every 4 hours and this is how it must be given.  Pt states "but I had it last night like that and went straight to sleep".  Again explained time frame of availability to Pt and explained what is available now.  Pt requested to be checked on at midnight when he can next receive his ativan and seemed to be ruminating on this.  Pt did not attend evening wrap up group due to anxiety.  Pt has remained in his room coloring most of evening except when he comes up to ask about availability of medications.  A.  Support and encouragement offered, medications given as ordered.  R. Pt remains safe on unit, will continue to monitor.

## 2014-12-11 NOTE — [facility] Group Notes (Signed)
[facility] Group Notes:  (Nursing/MHT/Case Management/Adjunct)  Date:  12/11/2014  Time:  9:49 AM  Type of Therapy:  Nurse Education  Participation Level:  Active  Participation Quality:  Appropriate  Affect:  Appropriate  Cognitive:  Alert  Insight:  Appropriate  Engagement in Group:  Engaged  Modes of Intervention:  Education  Summary of Progress/Problems:  Rich BraveDuke, Crystalann Korf Lynn 12/11/2014, 9:49 AM

## 2014-12-11 NOTE — [facility] Group Notes (Signed)
[facility] Group Notes:  (Clinical Social Work)  12/11/2014     1:15-2:15PM  Summary of Progress/Problems:   The main focus of today's process group was to learn how to use a decisional balance exercise to move forward in the Stages of Change, which were described and discussed.  Motivational Interviewing and a worksheet were utilized to help patients explore in depth the perceived benefits and costs of unhealthy coping techniques, as well as the  benefits and costs of replacing that with a healthy coping skills.   The patient expressed little understanding of group topic.  Type of Therapy:  Group Therapy - Process   Participation Level:  Minimal  Participation Quality:  Attentive  Affect:  Blunted  Cognitive:  Alert  Insight:  Limited  Engagement in Therapy:  Limited  Modes of Intervention:  Education, Motivational Interviewing  Ambrose MantleMareida Grossman-Orr, LCSW 12/11/2014, 4:58 PM

## 2014-12-11 NOTE — [facility] Group Notes (Signed)
[facility] Group Notes: (Clinical Social Work)   12/11/2014      Type of Therapy:  Group Therapy   Participation Level:  Did Not Attend despite MHT prompting   Ambrose MantleMareida Grossman-Orr, LCSW 12/11/2014, 12:07 PM

## 2014-12-11 NOTE — Progress Notes (Signed)
Alan Hess is sad, depressed and focused on what is  Lacking in his life. He is anxious. He is worred and he is unable to identify what  He is worried about.  He has spent most of his day trying to argue when he can have have his meds. He does not go to the cafe' ( he says he has social anxiety ) and  He does not like to go togroups, either.    A He is encouraged to stay out of his bed and attend his groups.   R POC in place.

## 2014-12-11 NOTE — Progress Notes (Signed)
[facility] Group Notes:  (Nursing/MHT/Case Management/Adjunct)  Date:  12/11/2014  Time:  2100 Type of Therapy:  wrap up group  Participation Level:  Active  Participation Quality:  Appropriate, Inattentive, Redirectable and Sharing  Affect:  Appropriate  Cognitive:  Appropriate  Insight:  Lacking  Engagement in Group:  Engaged  Modes of Intervention:  Clarification, Education and Socialization  Summary of Progress/Problems:  Shelah LewandowskySquires, Catilyn Boggus Carol 12/11/2014, 11:30 PM

## 2014-12-12 NOTE — Progress Notes (Addendum)
Pt came to the med window requesting ativan stating ,'I am very anxious." Pt admitted that yesterday he took two pills from another pt. which he stated were just two tylenols. Pt stated,"yeah the other pt wanted my airmaxies that cost $180.00 and he never gave me his pills so I took my shoes back from his room." Pt does contract for safety and denies Si and HI. Pt stated,"I need my adderrall sooner than 11am so see what you can do." Pt appears very nervous and paces in the hallways. He does not appear to miss any of his prn ativan's.10:10am-Pt requested that he needs to have his adderrall at 7a-11a-2pm. MD made aware. Pt stated,"all the nurses always give me my medications early. " Informed pt he would receive his medications when scheduled. 12:30p-Pt pulled out a small piece of crayon that was the same color as his adderall and stated a pt was accusing him of not taking his meds. Pt requested another piece of nicorette gum and was told he had a piece one hour earlier and could not have one at this time.

## 2014-12-12 NOTE — Progress Notes (Signed)
Patient ID: Alan Hess, male   DOB: 12-22-1990, 24 y.o.   MRN: [id number]  Pt currently presents with a flat affect and anxious behavior. Pt frequently asks for "anxiety" medications. Pt preoccupied with receiving sample medications to last until his "fill date" when he can renew his prescriptions for Adderall and Ativan. Pt provided with medications per providers orders. Pt's vitals were monitored. Pt supported emotionally and encouraged to express concerns and questions. Pt educated on medications and assertiveness techniques. Pt given a 1:1 on self esteem. Pt states "My family tells me all the time I'm no good, I am hardworking I have two jobs, I don't want to keep coming back here." Pt currently denies SI/HI and A/V hallucinations. Pt verbally agrees to seek staff if SI/HI or A/VH occurs and to consult with staff before acting on these thoughts.

## 2014-12-12 NOTE — [facility] Group Notes (Signed)
[facility] Group Notes:  (Clinical Social Work)  12/12/2014  10:00-11:00AM  Summary of Progress/Problems:   The main focus of today's process group was to   1)  discuss the importance of adding supports  2)  define health supports versus unhealthy supports  3)  identify the patient's current unhealthy supports and plan how to handle them  4)  Identify the patient's current healthy supports and plan what to add.  An emphasis was placed on using counselor, doctor, therapy groups, 12-step groups, and problem-specific support groups to expand supports.    The patient expressed full comprehension of the concepts presented, and agreed that there is a need to add more supports.  The patient stated his 2yo nephew is a healthy support to him because he is inspired to do better for him.  He kept having side jokes with another patient.  He left group, did not return.  Type of Therapy:  Process Group with Motivational Interviewing  Participation Level:  Minimal  Participation Quality:  Attentive  Affect:  Blunted  Cognitive:  Alert  Insight:  Limited  Engagement in Therapy:  Limited  Modes of Intervention:   Education, Support and Processing, Activity  Ambrose MantleMareida Grossman-Orr, LCSW 12/12/2014

## 2014-12-12 NOTE — Progress Notes (Addendum)
Patient did not attend the evening speaker AA meeting. Pt was notified that group was beginning but remained in room reporting he was not an alcoholic or an addict. He was just here because of his Adderall. Pt was still encouraged to attend but remained in his room.

## 2014-12-12 NOTE — [facility] Group Notes (Signed)
[facility] Group Notes:  Relapse prevention  Date:  12/12/2014  Time:  12:45 PM  Type of Therapy:  Nurse Education  Participation Level:  Minimal  Participation Quality:  Drowsy  Affect:  Flat  Cognitive:  Lacking  Insight:  None  Engagement in Group:  None  Modes of Intervention:  Education  Summary of Progress/Problems:pt walked into group with only 5 minutes left in the group   Alan Hess, Alan Hess 12/12/2014, 12:45 PM

## 2014-12-12 NOTE — Progress Notes (Signed)
D.  Pt anxious on approach, concerned Dr. Dub MikesLugo will not give him his requested Adderall and Ativan samples upon discharge.  Pt's UDS, taken yesterday after incident with peer,  came back negative for opiates (he had originally been  heard to say that he traded his shoes for Vicodin from peer), but the urine was also negative for amphetimines and Pt is prescribed Adderall.  Pt is afraid that Dr. Dub MikesLugo will be mad at him.  Pt took night time medications but was seen quickly putting his hands in his pockets immediately after. Pt has to be reminded to not chew his medications.  Denies SI/HI/hallucinations at this time.  Pt did not attend evening AA group.  A.  Support and encouragement offered, medications given as ordered  R. Pt remains safe on unit, will continue to monitor.

## 2014-12-12 NOTE — Progress Notes (Signed)
[facility] MD Progress Note  12/12/2014 11:18 AM Alan BurlySamuel L Hess  MRN:  [id number]   Subjective: Patient states that he wants to take his Adderall and 7 am, 9 am, and 11 am.  "That is how Dr. Dub MikesLugo had me taking it.  I don't want to take it at 3 pm cause it's hard for me to go to sleep.  My schedule was just fine until they changed it."  When I asked patient about Extended Release; patient states "I didn't know about that is that a capsule is it granule; I ain't never had the extended release."    Objective: Patient seen and chart reviewed.  Patient UDS negative for opiate and amphetamine.  Patient states that it was his urine.  Explained to patient that Adderall was not showing up in his urine states that he was taking his medicine.  Patient then stated "lets go do a urine test now or do some blood work.  I took my medicine this morning."   Patient has been contantly asking for Adderall  suggesting the time he should take his medications; getting up set with nursing when they do no do as he ask when it comes to the Adderall or Ativan.  UDS was positive for Benzodiazapine.   Prior to patient's admission he ran out of his medication early stating that he wanted to stay here in the hospital until he could get his next prescription filled which will be Friday 12/17/14.  His plans are if Dr. Dub MikesLugo is going to discharge him on Monday to give him samples  Adderall that will last him until Friday. Informed patient that he has not been taking his medication because it did not show up in his urine that was taken yesterday. But patient continues to deny that he used the urine from another patient stating that it was his urine.   There is a possibility that patient is not taking his medications at home as he should since he is runing out early and diverting his medications while here in the hospital. Futher assessment needs to be done prior to sending patient home with medication if he is not using it as directed and for  the purpose as intedded.    Will consider putting this patient on a one to one if he is left on the Adderall until the return of Dr. Dub MikesLugo or Discontine the Adderall    Principal Problem: Amphetamine withdrawal Diagnosis:   Patient Active Problem List   Diagnosis Date Noted  . 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 Acute stress disorder [F43.9] 12/07/2014  . 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 Attention deficit hyperactivity disorder (ADHD), combined type [F90.2]   . ADHD (attention deficit hyperactivity disorder) [F90.9] 10/07/2014  . PTSD (post-traumatic stress disorder) [F43.10] 10/07/2014  . Substance abuse [F19.10] 10/07/2014  . Seasonal allergies [J30.2]   . ADD (attention deficit disorder) [F90.9]    Total Time spent with patient: 45 minutes   Past Medical History:  Past Medical History  Diagnosis Date  . Seasonal allergies   . ADD (attention deficit disorder)   . Depression   . Anxiety    History reviewed. No pertinent past surgical history. Family History:  Family History  Problem Relation Age of Onset  . Cancer Other   . Diabetes Other    Social History:  History  Alcohol Use No     History  Drug Use  . Yes  . Special: Cocaine    Comment: pt denies  History   Social History  . Marital Status: Single    Spouse Name: N/A  . Number of Children: N/A  . Years of Education: N/A   Social History Main Topics  . Smoking status: Never Smoker   . Smokeless tobacco: Current User    Types: Chew, Snuff  . Alcohol Use: No  . Drug Use: Yes    Special: Cocaine     Comment: pt denies  . Sexual Activity: Yes    Birth Control/ Protection: Condom   Other Topics Concern  . None   Social History Narrative   Additional History:    Sleep: Fair, "Last night first night I slept all the way through"  Appetite:  Fair, Improving   Assessment:   Musculoskeletal: Strength & Muscle Tone: within normal limits Gait & Station: normal Patient leans:  normal   Psychiatric Specialty Exam: Physical Exam  Constitutional: He is oriented to person, place, and time.  Neck: Normal range of motion.  Musculoskeletal: Normal range of motion.  Neurological: He is alert and oriented to person, place, and time.  Psychiatric: His speech is normal. Thought content normal. His mood appears anxious. He is hyperactive.    Review of Systems  Constitutional: Negative.   HENT: Negative.   Eyes: Negative.   Respiratory: Negative.   Cardiovascular: Negative.   Gastrointestinal: Negative.   Genitourinary: Negative.   Musculoskeletal: Negative.   Skin: Negative.   Endo/Heme/Allergies: Negative.   Psychiatric/Behavioral: The patient is nervous/anxious.     Blood pressure 112/63, pulse 135, temperature 98.6 F (37 C), temperature source Oral, resp. rate 18, height 5\' 9"  (1.753 m), weight 49.896 kg (110 lb), SpO2 100 %.Body mass index is 16.24 kg/(m^2).  General Appearance: Fairly Groomed  Patent attorney::  Fair  Speech:  Clear and Coherent  Volume:  Normal  Mood:  Euthymic  Affect:  Appropriate  Thought Process:  Circumstantial and Linear  Orientation:  Full (Time, Place, and Person)  Thought Content:  symptoms worries concerns  Suicidal Thoughts:  No  Homicidal Thoughts:  No  Memory:  Immediate;   Fair Recent;   Fair Remote;   Fair  Judgement:  Poor  Insight:  Present  Psychomotor Activity:  Normal  Concentration:  Fair  Recall:  [employer] of Knowledge:Fair  Language: Fair  Akathisia:  No  Handed:  Right  AIMS (if indicated):     Assets:  Desire for Improvement Housing Social Support  ADL's:  Intact  Cognition: WNL  Sleep:  Number of Hours: 6.25     Current Medications: Current Facility-Administered Medications  Medication Dose Route Frequency Provider Last Rate Last Dose  . acetaminophen (TYLENOL) tablet 650 mg  650 mg Oral Q6H PRN Kerry Hough, PA-C   650 mg at 12/08/14 1610  . alum & mag hydroxide-simeth (MAALOX/MYLANTA)  200-200-20 MG/5ML suspension 30 mL  30 mL Oral Q4H PRN Worthy Flank, NP      . amphetamine-dextroamphetamine (ADDERALL) tablet 20 mg  20 mg Oral BID WC Rachael Fee, MD   20 mg at 12/12/14 1102  . amphetamine-dextroamphetamine (ADDERALL) tablet 20 mg  20 mg Oral Daily Rachael Fee, MD   20 mg at 12/11/14 1500  . feeding supplement (ENSURE ENLIVE) (ENSURE ENLIVE) liquid 237 mL  237 mL Oral TID BM Rachael Fee, MD   237 mL at 12/12/14 1000  . hydrOXYzine (ATARAX/VISTARIL) tablet 50 mg  50 mg Oral Q6H PRN Worthy Flank, NP   50 mg at  12/11/14 2355  . ibuprofen (ADVIL,MOTRIN) tablet 600 mg  600 mg Oral Q6H PRN Adonis Brook, NP      . LORazepam (ATIVAN) tablet 1 mg  1 mg Oral Q4H PRN Rachael Fee, MD   1 mg at 12/12/14 5621  . magnesium hydroxide (MILK OF MAGNESIA) suspension 30 mL  30 mL Oral Daily PRN Worthy Flank, NP      . methocarbamol (ROBAXIN) tablet 500 mg  500 mg Oral Q6H PRN Rachael Fee, MD   500 mg at 12/11/14 2057  . mirtazapine (REMERON) tablet 15 mg  15 mg Oral QHS Rachael Fee, MD   15 mg at 12/11/14 2057  . nicotine polacrilex (NICORETTE) gum 2 mg  2 mg Oral PRN Rachael Fee, MD   2 mg at 12/12/14 1110  . ondansetron (ZOFRAN-ODT) disintegrating tablet 4 mg  4 mg Oral Q6H PRN Kerry Hough, PA-C   4 mg at 12/08/14 1535    Lab Results:  Results for orders placed or performed during the hospital encounter of 12/06/14 (from the past 48 hour(s))  Urine rapid drug screen (hosp performed)     Status: Abnormal   Collection Time: 12/11/14  2:15 PM  Result Value Ref Range   Opiates NONE DETECTED NONE DETECTED   Cocaine NONE DETECTED NONE DETECTED   Benzodiazepines POSITIVE (A) NONE DETECTED   Amphetamines NONE DETECTED NONE DETECTED   Tetrahydrocannabinol NONE DETECTED NONE DETECTED   Barbiturates NONE DETECTED NONE DETECTED    Comment:        DRUG SCREEN FOR MEDICAL PURPOSES ONLY.  IF CONFIRMATION IS NEEDED FOR ANY PURPOSE, NOTIFY LAB WITHIN 5 DAYS.        LOWEST  DETECTABLE LIMITS FOR URINE DRUG SCREEN Drug Class       Cutoff (ng/mL) Amphetamine      1000 Barbiturate      200 Benzodiazepine   200 Tricyclics       300 Opiates          300 Cocaine          300 THC              50 Performed at [facility]     Physical Findings: AIMS: Facial and Oral Movements Muscles of Facial Expression: None, normal Lips and Perioral Area: None, normal Jaw: None, normal Tongue: None, normal,Extremity Movements Upper (arms, wrists, hands, fingers): None, normal Lower (legs, knees, ankles, toes): None, normal, Trunk Movements Neck, shoulders, hips: None, normal, Overall Severity Severity of abnormal movements (highest score from questions above): None, normal Incapacitation due to abnormal movements: None, normal Patient's awareness of abnormal movements (rate only patient's report): No Awareness, Dental Status Current problems with teeth and/or dentures?: No Does patient usually wear dentures?: No  CIWA:    COWS:     Treatment Plan Summary: Daily contact with patient to assess and evaluate symptoms and progress in treatment and Medication management Supportive approach/coping skills Inattentiveness/distractibility; will increase the Adderall to 20 mg TID. He does seem to need this medication and this dosage to function (at the [facility] clinic he is being help to develop strategies not to have to depend on the medication but right now he seems to need the higher dose) Anxiety; will continue to work with CBT/minfulness  UDS ordered to screen for opiates.  Will continue with current treatment plan at this time.   Medical Decision Making:  Review of Psycho-Social Stressors (1) and Review of Medication Regimen &  Side Effects (2)   Rankin, Shuvon, FNP-BC 12/12/2014, 11:18 AM

## 2014-12-13 MED ORDER — IBUPROFEN 800 MG PO TABS: 800.0000 mg | ORAL_TABLET | Freq: Three times a day (TID) | ORAL | Status: DC | PRN

## 2014-12-13 MED ORDER — AMPHETAMINE-DEXTROAMPHETAMINE 10 MG PO TABS: ORAL_TABLET | ORAL | Status: DC

## 2014-12-13 MED ORDER — MIRTAZAPINE 15 MG PO TABS: 15.0000 mg | ORAL_TABLET | Freq: Every day | ORAL | Status: DC

## 2014-12-13 MED ORDER — HYDROXYZINE HCL 50 MG PO TABS: 50.0000 mg | ORAL_TABLET | Freq: Four times a day (QID) | ORAL | Status: DC | PRN

## 2014-12-13 MED ORDER — AMPHETAMINE-DEXTROAMPHETAMINE 10 MG PO TABS
10.0000 mg | ORAL_TABLET | Freq: Two times a day (BID) | ORAL | Status: DC
  Administered 2014-12-13: 10 mg via ORAL
  Filled 2014-12-13: qty 1

## 2014-12-13 MED ORDER — AMPHETAMINE-DEXTROAMPHETAMINE 10 MG PO TABS: 10.0000 mg | ORAL_TABLET | Freq: Every day | ORAL | Status: DC

## 2014-12-13 MED ORDER — NICOTINE POLACRILEX 2 MG MT GUM: 2.0000 mg | CHEWING_GUM | OROMUCOSAL | Status: DC | PRN

## 2014-12-13 MED ORDER — LORAZEPAM 1 MG PO TABS: 1.0000 mg | ORAL_TABLET | Freq: Four times a day (QID) | ORAL | Status: DC | PRN

## 2014-12-13 MED ORDER — LORAZEPAM 1 MG PO TABS: ORAL_TABLET | ORAL | Status: DC

## 2014-12-13 NOTE — Plan of Care (Signed)
Problem: Alteration in mood & ability to function due to Goal: STG-Patient will attend groups Outcome: Not Progressing Pt did not attend group, denied need

## 2014-12-13 NOTE — [facility] Suicide Risk Assessment (Signed)
[facility] INPATIENT:  Family/Significant Other Suicide Prevention Education  Suicide Prevention Education:  Education Completed; Mother Maxwell CaulRegina Biggs [phone],,  (name of family member/significant other) has been identified by the patient as the family member/significant other with whom the patient will be residing, and identified as the person(s) who will aid the patient in the event of a mental health crisis (suicidal ideations/suicide attempt).  With written consent from the patient, the family member/significant other has been provided the following suicide prevention education, prior to the and/or following the discharge of the patient.  The suicide prevention education provided includes the following:  Suicide risk factors  Suicide prevention and interventions  National Suicide Hotline telephone number  [facility] assessment telephone number  [facility] Emergency Assistance 911  [facility] and/or Residential Mobile Crisis Unit telephone number  Request made of family/significant other to:  Remove weapons (e.g., guns, rifles, knives), all items previously/currently identified as safety concern.    Remove drugs/medications (over-the-counter, prescriptions, illicit drugs), all items previously/currently identified as a safety concern.  The family member/significant other verbalizes understanding of the suicide prevention education information provided.  The family member/significant other agrees to remove the items of safety concern listed above.  Annissa Andreoni, [place] CarboKristin L 12/13/2014, 1:09 PM

## 2014-12-13 NOTE — [facility] Group Notes (Signed)
[facility] LCSW Aftercare Discharge Planning Group Note  12/13/2014  8:45 AM  Participation Quality: Did Not Attend. Patient invited to participate but declined.   Javani Spratt, MSW, LCSWA Clinical Social Worker [facility] [phone]  

## 2014-12-13 NOTE — Plan of Care (Signed)
Problem: Ineffective individual coping Goal: STG: Patient will remain free from self harm Outcome: Progressing Pt did not engage in self harm

## 2014-12-13 NOTE — [facility] Suicide Risk Assessment (Signed)
[facility] Discharge Suicide Risk Assessment   Demographic Factors:  Male, Adolescent or young adult and Caucasian  Total Time spent with patient: 30 minutes  Musculoskeletal: Strength & Muscle Tone: within normal limits Gait & Station: normal Patient leans: normal  Psychiatric Specialty Exam: Physical Exam  Review of Systems  Constitutional: Negative.   HENT: Negative.   Eyes: Negative.   Respiratory: Negative.   Cardiovascular: Negative.   Gastrointestinal: Negative.   Genitourinary: Negative.   Musculoskeletal: Negative.   Skin: Negative.   Neurological: Negative.   Endo/Heme/Allergies: Negative.   Psychiatric/Behavioral: Positive for substance abuse. The patient is nervous/anxious.     Blood pressure 112/63, pulse 129, temperature 98.2 F (36.8 C), temperature source Oral, resp. rate 16, height  (1.753 m), weight 49.896 kg (110 lb), SpO2 100 %.Body mass index is 16.24 kg/(m^2).  General Appearance: Fairly Groomed  Patent attorney::  Fair  Speech:  Clear and Coherent409  Volume:  Decreased  Mood:  Euthymic  Affect:  Restricted  Thought Process:  Coherent and Goal Directed  Orientation:  Full (Time, Place, and Person)  Thought Content:  plans as he moves on  Suicidal Thoughts:  No  Homicidal Thoughts:  No  Memory:  Immediate;   Fair Recent;   Fair Remote;   Fair  Judgement:  Fair  Insight:  Present  Psychomotor Activity:  Normal  Concentration:  Fair  Recall:  [employer] of Knowledge:Fair  Language: Fair  Akathisia:  No  Handed:  Right  AIMS (if indicated):     Assets:  Desire for Improvement Housing Social Support Vocational/Educational  Sleep:  Number of Hours: 6.25  Cognition: WNL  ADL's:  Intact   Have you used any form of tobacco in the last 30 days? (Cigarettes, Smokeless Tobacco, Cigars, and/or Pipes): Yes  Has this patient used any form of tobacco in the last 30 days? (Cigarettes, Smokeless Tobacco, Cigars, and/or Pipes) Yes, Prescription not  provided because: nicotine patches will be given  Mental Status Per Nursing Assessment::   On Admission:     Current Mental Status by Physician: In full contact with reality. Not owning to his behavior during the weekend. He states he wants to leave today. He denies SI plans or intent. He had signed 76 HRS and does not feel he needs to stay any further and there is no criteria to involuntarily commit. He states he plans not to stay at home as states his uncle took the Adderall that he was missing. He states that at home he is surrounded by all this negative influences. States he wants a new beginning. He plans to stay with his GF who will pick him u today. He is looking forward to his new job with the [employer]   Loss Factors: NA  Historical Factors: Impulsivity  Risk Reduction Factors:   Sense of responsibility to family, Employed, Living with another person, especially a relative and Positive social support  Continued Clinical Symptoms:  Alcohol/Substance Abuse/Dependencies  Cognitive Features That Contribute To Risk:  Closed-mindedness, Polarized thinking and Thought constriction (tunnel vision)    Suicide Risk:  Minimal: No identifiable suicidal ideation.  Patients presenting with no risk factors but with morbid ruminations; may be classified as minimal risk based on the severity of the depressive symptoms  Principal Problem: Amphetamine withdrawal Discharge Diagnoses:  Patient Active Problem List   Diagnosis Date Noted  . 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 Acute stress disorder [F43.9] 12/07/2014  . 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  Attention deficit hyperactivity disorder (ADHD), combined type [F90.2]   . ADHD (attention deficit hyperactivity disorder) [F90.9] 10/07/2014  . PTSD (post-traumatic stress disorder) [F43.10] 10/07/2014  . Substance abuse [F19.10] 10/07/2014  . Seasonal allergies [J30.2]   . ADD (attention deficit disorder) [F90.9]     Follow-up  Information    Follow up with [facility]   Specialty:  Emergency Medicine   Why:  If symptoms worsen   Contact information:   [address] [id number] mc [place] [id number] [phone]      [facility]  On 12/14/2014.   Why:  Appt on Tuesday 7/12 at 10 AM for intake assessment and medications management   Contact information:   [address] [place], [place]  [address] Phone: [phone] Fax:  [phone]      [facility]   Why:  Please contact therapist Benjaman LobeMelissa Demayo regarding scheduling therapy appointment if interested in resuming services.   Contact information:   [address] [place], [place] [address] Phone [phone]      Plan Of Care/Follow-up recommendations:  Activity:  as tolerated Diet:  regular Follow up as above.  Is patient on multiple antipsychotic therapies at discharge:  No   Has Patient had three or more failed trials of antipsychotic monotherapy by history:  No  Recommended Plan for Multiple Antipsychotic Therapies: NA    Hensley Aziz A 12/13/2014, 12:32 PM

## 2014-12-13 NOTE — Progress Notes (Signed)
Patient ID: Alan Hess, male   DOB: 04-15-1991, 24 y.o.   MRN: [id number]   Pt currently presents with a flat affect and anxious behavior. Per self inventory, pt rates depression at a 2, hopelessness 7 and anxiety 7. Pt's daily goal is to "my goal is to talk to doctor AfghanistanLugo about writing me enough medications until my fill date" and they intend to do so by "pray and talk to him" Pt is anxious about leaving and being able to take his medications post discharge.   Pt provided with medications per providers orders. Pt's labs and vitals were monitored throughout the day. Pt supported emotionally and encouraged to express concerns and questions. Pt educated on medications and substance abuse.  Pt's safety ensured with 15 minute and environmental checks. Pt currently denies SI/HI and A/V hallucinations. Pt verbally agrees to seek staff if SI/HI or A/VH occurs and to consult with staff before acting on these thoughts. Pt to be discharged today.

## 2014-12-13 NOTE — Progress Notes (Signed)
  [facility] Discharge [facility] :  Will you be returning to the same living situation after discharge:  Yes,  patient plans to return home At discharge, do you have transportation home?: Yes,  patient reports access to transportation Do you have the ability to pay for your medications: Yes,  patient will be provided with medication samples and prescriptions  Release of information consent forms completed and in the chart;  Patient's signature needed at discharge.  Patient to Follow up at: Follow-up Information    Follow up with [facility]   Specialty:  Emergency Medicine   Why:  If symptoms worsen   Contact information:   [address] [id number] mc [place] [id number] [phone]      [facility]  On 12/14/2014.   Why:  Appt on Tuesday 7/12 at 10 AM for intake assessment and medications management   Contact information:   [address] [place], [place]  [address] Phone: [phone] Fax:  [phone]      Patient denies SI/HI: Yes,  denies    Safety Planning and Suicide Prevention discussed: Yes,  with patient  Have you used any form of tobacco in the last 30 days? (Cigarettes, Smokeless Tobacco, Cigars, and/or Pipes): Yes  Has patient been referred to the Quitline?: Patient refused referral  Rhena Glace, [place]ristin L 12/13/2014, 9:50 AM

## 2014-12-13 NOTE — Progress Notes (Signed)
Patient ID: Alan Hess, male   DOB: 01/21/1991, 24 y.o.   MRN: [id number]   Pt discharged home with his girlfriend. Pt was stable and appreciative at the time of discharge. All papers and prescriptions were given and valuables returned. Verbal understanding expressed. Denies SI/HI and A/VH. Pt given opportunity to express concerns and ask questions. Pt was able to verbalize the importance of taking his medications and weaning off medications, not just abruptly stopping them.

## 2014-12-13 NOTE — Progress Notes (Signed)
Recreation Therapy Notes  Date: 07.11.16 Time: 9:30 am Location: [address] Group Room  Group Topic: Stress Management  Goal Area(s) Addresses:  Patient will verbalize importance of using healthy stress management.  Patient will identify positive emotions associated with healthy stress management.   Behavioral Response:  Engaged  Intervention: Stress Management  Activity :  Progressive Muscle Relaxation.  LRT introduced and educated patients on stress management technique of progressive muscle relaxation.  A script was used to deliver both techniques to patients.  Patients were asked to follow the script read a loud by the LRT to engage in practicing the technique.  Education:  Stress Management, Discharge Planning.   Education Outcome: Acknowledges edcuation/In group clarification offered/Needs additional education  Clinical Observations/Feedback: Patient stated he felt really relaxed and that the activity helped with his back pain.  Patient also stated that LRT's voice was calming and really helped make the activity relaxing.   Caroll RancherMarjette Aleeta Schmaltz, LRT/CTRS  Lillia AbedLindsay, Shahd Occhipinti A 12/13/2014 3:50 PM

## 2014-12-13 NOTE — Discharge Summary (Signed)
Physician Discharge Summary Note  Patient:  Alan Hess is an 24 y.o., male MRN:  [id number] DOB:  1991/03/26 Patient phone:  [phone] (home)  Patient address:   [address] [place] [address],  Total Time spent with patient: Greater than 30 minutes  Date of Admission:  12/06/2014  Date of Discharge: 12-13-14  Reason for Admission:  Opioid/Amphetamine withdrawal symptoms  Principal Problem: Amphetamine withdrawal Discharge Diagnoses: Patient Active Problem List   Diagnosis Date Noted  . 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 Acute stress disorder [F43.9] 12/07/2014  . 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 Attention deficit hyperactivity disorder (ADHD), combined type [F90.2]   . ADHD (attention deficit hyperactivity disorder) [F90.9] 10/07/2014  . PTSD (post-traumatic stress disorder) [F43.10] 10/07/2014  . Substance abuse [F19.10] 10/07/2014  . Seasonal allergies [J30.2]   . ADD (attention deficit disorder) [F90.9]    Musculoskeletal: Strength & Muscle Tone: within normal limits Gait & Station: normal Patient leans: N/A  Psychiatric Specialty Exam: Physical Exam  Psychiatric: His speech is normal and behavior is normal. Judgment and thought content normal. His mood appears not anxious. His affect is not angry, not blunt, not labile and not inappropriate. Cognition and memory are normal. He does not exhibit a depressed mood.    Review of Systems  Constitutional: Negative.   HENT: Negative.   Eyes: Negative.   Respiratory: Negative.   Gastrointestinal: Negative.   Genitourinary: Negative.   Musculoskeletal: Negative.   Skin: Negative.   Neurological: Negative.   Endo/Heme/Allergies: Negative.   Psychiatric/Behavioral: Positive for substance abuse (Opioid/Amphetamine abuse). Negative for depression, suicidal ideas, hallucinations and memory loss. The patient has insomnia (Stable). The patient is not nervous/anxious.     Blood pressure 112/63, pulse  129, temperature 98.2 F (36.8 C), temperature source Oral, resp. rate 16, height 5\' 9"  (1.753 m), weight 49.896 kg (110 lb), SpO2 100 %.Body mass index is 16.24 kg/(m^2).  See Md's SRA   Have you used any form of tobacco in the last 30 days? (Cigarettes, Smokeless Tobacco, Cigars, and/or Pipes): Yes  Has this patient used any form of tobacco in the last 30 days? (Cigarettes, Smokeless Tobacco, Cigars, and/or Pipes): Yes, Prescription not provided because: nicotine patches will be given  Past Medical History:  Past Medical History  Diagnosis Date  . Seasonal allergies   . ADD (attention deficit disorder)   . Depression   . Anxiety    History reviewed. No pertinent past surgical history. Family History:  Family History  Problem Relation Age of Onset  . Cancer Other   . Diabetes Other    Social History:  History  Alcohol Use No     History  Drug Use  . Yes  . Special: Cocaine    Comment: pt denies    History   Social History  . Marital Status: Single    Spouse Name: N/A  . Number of Children: N/A  . Years of Education: N/A   Social History Main Topics  . Smoking status: Never Smoker   . Smokeless tobacco: Current User    Types: Chew, Snuff  . Alcohol Use: No  . Drug Use: Yes    Special: Cocaine     Comment: pt denies  . Sexual Activity: Yes    Birth Control/ Protection: Condom   Other Topics Concern  . None   Social History Narrative   Risk to Self: Is patient at risk for suicide?: No Risk to Others: No Prior Inpatient Therapy: Yes Prior  Outpatient Therapy: Yes  Level of Care:  OP  Hospital Course:  24 Y/o male who states that he was going to take his medication and there was no medication left in the bottle. States he went "crazy" looking for his medication. States first two days were OK but by the third day unable to handle it. Became scared have the bad feeling in mouth of his stomach unable to sleep, unable to eat. Started feeling he wanted to kill  himself. States that he can't go away too far, has to return because of the anxiety. States the Adderall helps his anxiety and his eating as well as his sleep. States he did not overtake it. He admits he was agitated at the ED, they gave him a shot of Geodon.  Alan Hess was admitted to the hospital with his UDS test results positive for Benzodiazepine, but absence of Amphetamine. However, Alan Hess already reported that his presenting symptoms were as a result of being off of his Adderall x 3 days. He cited that his uncle took the missing Adderall tablets. After admission assessment/evaluation, Alan Hess was resumed on his Adderall 10 mg for ADHD & Ativan 1 mg tablet for anxiety. He was also started on Remeron 15 mg for insomnia/depression & Nicorette gum for nicotine dependence. He was enrolled in the group counseling sessions being offered and held on this unit. This is for Alan Hess to learn coping skills that should help him after discharge to cope well with his symptoms.   As his treatment continued, staff reports indicated that Alan Hess may have been diverting his medications to exchange for other medications from the other patients. When approached, he was Not owning to his behavior. Rather, he states he wants to leave today. He denies SI plans or intent. He had signed 8572 HRS and does not feel he needs to stay any further and there is no criteria to involuntarily commit him. He states he plans not to stay at home as he states his uncle took the Adderall that he was missing. He states that at home he is surrounded by all this negative influences. He states he wants a new beginning. He plans to stay with his GF who will pick him up today. He is looking forward to his new job with the [employer].  And because Alan Hess is not very responsible utilizing his ordered medications (Adderall 10 mg & Ativan 1 mg), the decision was reached to wean him off of those medications. Alan Hess was provided prescriptions on both Adderall 10  mg & Ativan 1 mg on tapering dose formats. He is being discharged to follow-up care as recommended below. He is provided with all the necessary information needed to contact & make these appointments without problems.  Upon discharge, he adamantly denies any SIHI, AVH, delusional thoughts, paranoia & or substance withdrawal syndrome. He received a 4 days worth, [facility]. [facility] in no apparent distress. Transportation per girlfriend.  Consults:  psychiatry  Significant Diagnostic Studies:  labs: CBC with diff, CMP, UDS, toxicology tests, U/A, results reviewed, stable  Discharge Vitals:   Blood pressure 112/63, pulse 129, temperature 98.2 F (36.8 C), temperature source Oral, resp. rate 16, height 5\' 9"  (1.753 m), weight 49.896 kg (110 lb), SpO2 100 %. Body mass index is 16.24 kg/(m^2). Lab Results:   Results for orders placed or performed during the hospital encounter of 12/06/14 (from the past 72 hour(s))  Urine rapid drug screen (hosp  performed)     Status: Abnormal   Collection Time: 12/11/14  2:15 PM  Result Value Ref Range   Opiates NONE DETECTED NONE DETECTED   Cocaine NONE DETECTED NONE DETECTED   Benzodiazepines POSITIVE (A) NONE DETECTED   Amphetamines NONE DETECTED NONE DETECTED   Tetrahydrocannabinol NONE DETECTED NONE DETECTED   Barbiturates NONE DETECTED NONE DETECTED    Comment:        DRUG SCREEN FOR MEDICAL PURPOSES ONLY.  IF CONFIRMATION IS NEEDED FOR ANY PURPOSE, NOTIFY LAB WITHIN 5 DAYS.        LOWEST DETECTABLE LIMITS FOR URINE DRUG SCREEN Drug Class       Cutoff (ng/mL) Amphetamine      1000 Barbiturate      200 Benzodiazepine   200 Tricyclics       300 Opiates          300 Cocaine          300 THC              50 Performed at [facility]    Physical Findings: AIMS: Facial and Oral Movements Muscles of Facial Expression: None, normal Lips and Perioral Area:  None, normal Jaw: None, normal Tongue: None, normal,Extremity Movements Upper (arms, wrists, hands, fingers): None, normal Lower (legs, knees, ankles, toes): None, normal, Trunk Movements Neck, shoulders, hips: None, normal, Overall Severity Severity of abnormal movements (highest score from questions above): None, normal Incapacitation due to abnormal movements: None, normal Patient's awareness of abnormal movements (rate only patient's report): No Awareness, Dental Status Current problems with teeth and/or dentures?: No Does patient usually wear dentures?: No  CIWA:    COWS:     See Psychiatric Specialty Exam and Suicide Risk Assessment completed by Attending Physician prior to discharge.  Discharge destination:  Home  Is patient on multiple antipsychotic therapies at discharge:  No   Has Patient had three or more failed trials of antipsychotic monotherapy by history:  No  Recommended Plan for Multiple Antipsychotic Therapies: NA     Discharge Instructions    Diet - low sodium heart healthy    Complete by:  As directed      Increase activity slowly    Complete by:  As directed             Medication List    STOP taking these medications        amphetamine-dextroamphetamine 20 MG tablet  Commonly known as:  ADDERALL  Replaced by:  amphetamine-dextroamphetamine 10 MG tablet     traZODone 50 MG tablet  Commonly known as:  DESYREL      TAKE these medications      Indication   amphetamine-dextroamphetamine 10 MG tablet  Commonly known as:  ADDERALL  Take 1 tablet (10 mg) three times today 12-13-14: #3 tablets Take 1 tablet (10 mg) twice daily on 12-14-14: #2 tablets Take 1 tablet (10 mg) once daily on 12-15-14: #1 tablet: Amphetamine taper   Indication:  Amphetamine taper     hydrOXYzine 50 MG tablet  Commonly known as:  ATARAX/VISTARIL  Take 1 tablet (50 mg total) by mouth every 6 (six) hours as needed for anxiety (sleep).   Indication:  Anxiety/sleep      ibuprofen 800 MG tablet  Commonly known as:  ADVIL,MOTRIN  Take 1 tablet (800 mg total) by mouth every 8 (eight) hours as needed for mild pain.   Indication:  Moderate pain     LORazepam 1 MG tablet  Commonly known as:  ATIVAN  Take 1 tablet (1 mg) three times daily today 12-13-14: #3 tablets Take 1 tablet (1 mg) two times daily on 12-14-14: #2 tablets. Take 1 tablet (1 mg) once on 12-16-14 #1 tablet: #1 tablet. Take  1/2 tablet (0.5 mg) bid on 12-17-14) #1 tablet (may half this for patient). Take 1/2 tablet (0.5 mg) once on 12-18-14: Ativan dose taper.   Indication:  Feeling Anxious, Ativan taper     mirtazapine 15 MG tablet  Commonly known as:  REMERON  Take 1 tablet (15 mg total) by mouth at bedtime. For depression/sleep   Indication:  Trouble Sleeping, Major Depressive Disorder     nicotine polacrilex 2 MG gum  Commonly known as:  NICORETTE  Take 1 each (2 mg total) by mouth as needed for smoking cessation.   Indication:  Nicotine Addiction       Follow-up Information    Follow up with [facility].   Specialty:  Emergency Medicine   Why:  If symptoms worsen   Contact information:   [address] [id number] mc [place] [address] [phone]      Follow up with [facility]  On 12/14/2014.   Why:  Appt on Tuesday 7/12 at 10 AM for intake assessment and medications management   Contact information:   [address] [place], [place]  [address] Phone: [phone] Fax:  [phone]      Follow up with [facility].   Why:  Please contact therapist Benjaman Lobe regarding scheduling therapy appointment if interested in resuming services.   Contact information:   [address] [place], [place] [address] Phone [phone]     Follow-up recommendations:  Activity:  As tolerated Diet: As recommended by your primary care doctor. Keep all scheduled follow-up appointments as recommended.  Comments: Take all  your medications as prescribed by your mental healthcare provider. Report any adverse effects and or reactions from your medicines to your outpatient provider promptly. Patient is instructed and cautioned to not engage in alcohol and or illegal drug use while on prescription medicines. In the event of worsening symptoms, patient is instructed to call the crisis hotline, 911 and or go to the nearest ED for appropriate evaluation and treatment of symptoms. Follow-up with your primary care provider for your other medical issues, concerns and or health care needs.   Total Discharge Time: Greater than 30 minutes  Signed: Sanjuana Kava, PMHNp-BC 12/14/2014, 10:36 AM  I personally assessed the patient and formulated the plan Madie Reno A. Dub Mikes, M.D.

## 2014-12-13 NOTE — Clinical Social Work Note (Signed)
CSW left [facility]ic[facility] Benjaman LobeMelissa Demayo regarding scheduling follow up appointment. Awaiting return call.  Samuella BruinKristin Davontay [place], MSW, [employer]CSWA Clinical Social Worker [facility] [phone]

## 2015-01-15 ENCOUNTER — Emergency Department (HOSPITAL_COMMUNITY)
Admission: EM | Admit: 2015-01-15 | Discharge: 2015-01-15 | Disposition: A | Discharge status: Home / Self Care | Payer: Self-pay | Attending: Emergency Medicine | Admitting: Emergency Medicine

## 2015-01-15 ENCOUNTER — Encounter (HOSPITAL_COMMUNITY): Payer: Self-pay | Admitting: Emergency Medicine

## 2015-01-15 DIAGNOSIS — R002 Palpitations: Secondary | ICD-10-CM | POA: Insufficient documentation

## 2015-01-15 DIAGNOSIS — R0602 Shortness of breath: Secondary | ICD-10-CM | POA: Insufficient documentation

## 2015-01-15 DIAGNOSIS — F909 Attention-deficit hyperactivity disorder, unspecified type: Secondary | ICD-10-CM | POA: Insufficient documentation

## 2015-01-15 DIAGNOSIS — F329 Major depressive disorder, single episode, unspecified: Secondary | ICD-10-CM | POA: Insufficient documentation

## 2015-01-15 DIAGNOSIS — F419 Anxiety disorder, unspecified: Secondary | ICD-10-CM | POA: Insufficient documentation

## 2015-01-15 DIAGNOSIS — Z79899 Other long term (current) drug therapy: Secondary | ICD-10-CM | POA: Insufficient documentation

## 2015-01-15 MED ORDER — LORAZEPAM 0.5 MG PO TABS
0.5000 mg | ORAL_TABLET | Freq: Once | ORAL | Status: AC
  Administered 2015-01-15: 0.5 mg via ORAL
  Filled 2015-01-15: qty 1

## 2015-01-15 NOTE — ED Notes (Signed)
Per EMS patient called [facility] to car at [facility] due to panic attack and elevated heart rate. Patient states that he has been out of his adoral and can't stop shaking.

## 2015-01-15 NOTE — Discharge Instructions (Signed)
°Emergency Department Resource Guide °1) Find a Doctor and Pay Out of Pocket °Although you won't have to find out who is covered by your insurance plan, it is a good idea to ask around and get recommendations. You will then need to call the office and see if the doctor you have chosen will accept you as a new patient and what types of options they offer for patients who are self-pay. Some doctors offer discounts or will set up payment plans for their patients who do not have insurance, but you will need to ask so you aren't surprised when you get to your appointment. ° °2) Contact Your Local Health Department °Not all health departments have doctors that can see patients for sick visits, but many do, so it is worth a call to see if yours does. If you don't know where your local health department is, you can check in your phone book. The CDC also has a tool to help you locate your state's health department, and many state websites also have listings of all of their local health departments. ° °3) Find a Walk-in Clinic °If your illness is not likely to be very severe or complicated, you may want to try a walk in clinic. These are popping up all over the country in pharmacies, drugstores, and shopping centers. They're usually staffed by nurse practitioners or physician assistants that have been trained to treat common illnesses and complaints. They're usually fairly quick and inexpensive. However, if you have serious medical issues or chronic medical problems, these are probably not your best option. ° °No Primary Care Doctor: °- Call [facility] at  832-8000 - they can help you locate a primary care doctor that  accepts your insurance, provides certain services, etc. °- Physician Referral Service- 1-800-533-3463 ° °Chronic Pain Problems: °Organization         Address  Phone   Notes  °[facility]  (336) 297-2271 Patients need to be referred by their primary care doctor.  ° °Medication  Assistance: °Organization         Address  Phone   Notes  °[place] [facility] [address] °[place], [place] [address] (336) 641-8030 --Must be a resident of [place] °-- Must have NO insurance coverage whatsoever (no Medicaid/ Medicare, etc.) °-- The pt. MUST have a primary care doctor that directs their care regularly and follows them in the community °  °[facility]  (866) 331-1348   °[employer]  (888) 892-1162   ° °Agencies that provide inexpensive medical care: °Organization         Address  Phone   Notes  °[facility]  (336) 832-8035   °[facility]    (336) 832-7272   °[facility] [address] °[place], [place] [address] (336) 832-4777   °[facility] [address], °[place] (336) 271-4999   °[facility]    (336) 373-0678   °[facility]    (336) 272-1050   °[facility] ° [address], [place] Phone:  (336) 832-4444, Fax:  (336) 832-4440 Hours of Operation:  9 am - 6 pm, M-F.  Also accepts Medicaid/Medicare and self-pay.  °[facility] ° [address], [place] Phone: (336) 832-3150, Fax: (336) 832-3151. Hours of Operation:  8:30 am - 5:30 pm, M-F.  Also accepts Medicaid and self-pay.  °[facility] [place] [address], [place] Phone: (336) 878-6027   °[facility] [address], [place], [place] (336)723-1848, Ext. 123 Mondays & Thursdays: 7-9 AM.  First 15 patients are seen on a first come, first serve basis. °  ° °Medicaid-accepting [place] Providers: ° °Organization         Address  Phone   Notes  °[facility] [address], [place] (336) 641-2100 Also accepts self-pay patients.  °[facility] [address], [place] ° (336) 856-9996   °[facility] [address], [place]  (336) 288-8857   °[facility] [address] [place] [address], [place] (336) 299-7000   °Veita Bland [address], [place]  ° (336) 373-1557 Only accepts [place] Access Medicaid patients after they have their name applied to their card.  ° °Self-Pay (no insurance) in [place]: ° °Organization         Address  Phone   Notes  °Sickle Cell Patients, [facility] [address], [place] (336) 832-1970   °[facility] [address], [place] (336) 832-4400   °[facility] [place] ° [address], [place] (336) 992-4800   °[facility]/Dr. Osei-Bonsu ° [address] [place] [address], [place] or [address], [place] (336) 841-8500 Phone number for both [place] and [place] locations is the same.  °[facility] [address], [place] (336) 299-0000   °[facility] [address] [place] [address], [place] or [address] (336) 852-7530 °(336) 878-2260   °[facility] [address], [place] (336) 350-1642, phone; (336) 294-5005, fax Sees patients 1st and 3rd Saturday of every month.  Must not qualify for public or private insurance (i.e. Medicaid, Medicare, [place] Health Choice, Veterans' Benefits) • Household income should be no more than 200% of the poverty level •The clinic cannot treat you if you are pregnant or think you are pregnant • Sexually transmitted diseases are not treated at the clinic.  ° ° °Dental Care: °Organization         Address  Phone  Notes  °[place] [facility] [address], [place] (336) 641-6152 Accepts children up to age 21 who are enrolled in Medicaid or [place] Health Choice; pregnant women with a Medicaid card; and children who have applied for Medicaid or [place] Health Choice, but were declined, whose parents can pay a reduced fee at time of service.  °[place]  [facility] [place]  [address], [place] (336) 641-7733 Accepts children up to age 21 who are enrolled in Medicaid or [place] Health Choice; pregnant women with a Medicaid card; and children who have applied for Medicaid or [place] Health Choice, but were declined, whose parents can pay a reduced fee at time of service.  °[facility] ° [address], [place] (336) 641-4533 Patients are seen by appointment only. Walk-ins are not accepted. [facility] will see patients 18 years of age and older. °Monday - Tuesday (8am-5pm) °Most Wednesdays (8:30-5pm) °$30 per visit, cash only  °[facility] ° [address], [place] (336) 641-4533 Patients are seen by appointment only. Walk-ins are not accepted. [facility] will see patients 18 years of age and older. °One   Wednesday Evening (Monthly: Volunteer Based).  $30 per visit, cash only  °[facility]  (919) 537-3737 for adults; Children under age 4, call Graduate Pediatric Dentistry at (919) [phone]. Children aged 4-14, please call (919) [phone] to request a pediatric application. ° Dental services are provided in all areas of dental care including fillings, crowns and bridges, complete and partial dentures, implants, gum treatment, root canals, and extractions. Preventive care is also provided. Treatment is provided to both adults and children. °Patients are selected via a lottery and there is often a waiting list. °  °[facility] [address], °[place] ° (336) 763-8833 [email or web address] °  °[facility] [address], [place], [place] (336)723-1848, Ext. 123 Second and Fourth Thursday of each month, opens at 6:30 AM; Clinic ends at 9 AM.  Patients are seen on a first-come first-served basis, and a limited number are seen during each clinic.  ° °[facility] ° [address], [place], [place] (336) 723-7904    Eligibility Requirements °You must have lived in [place], [place], or [place] counties for at least the last three months. °  You cannot be eligible for state or federal sponsored healthcare insurance, including Veterans Administration, Medicaid, or Medicare. °  You generally cannot be eligible for healthcare insurance through your employer.  °  How to apply: °Eligibility screenings are held every Tuesday and Wednesday afternoon from 1:00 pm until 4:00 pm. You do not need an appointment for the interview!  °[facility] [address], [place], [place] 336-631-2330   °[facility]  336-342-8273   °[facility]  336-703-3100   °[facility]  336-570-6415   ° °Behavioral Health Resources in the Community: °Intensive Outpatient Programs °Organization         Address  Phone  Notes  °[facility] [address], [place], [place] 336-878-6098   °[facility] [address], [place], [place] 336-832-9800   °[facility] [address], [place], [place] ° 336-882-2125   °[facility] [address],  °[place], [place] 1-800-853-5163 or 336-641-4981   °Substance Abuse Resources °Organization         Address  Phone  Notes  °[facility]  336-882-2125   °[facility]  336-784-9470   °[facility]  336-285-9073   °[facility]  336-845-3988   °Residential & Outpatient Substance Abuse Program  1-800-659-3381   °Psychological Services °Organization         Address  Phone  Notes  °[facility]  336- 832-9600   °[facility]  336- 378-7881   °[facility] [address], [place] 1-800-853-5163 or 336-641-4981   ° °Mobile Crisis Teams °Organization         Address  Phone  Notes  °[facility], Mobile Crisis Care Unit  1-877-626-1772   °Assertive °[facility] ° [address]  [place], [place] 336-834-9664   °Sharon DeEsch [address] °[place] 336-554-5454   ° °Self-Help/Support Groups °Organization         Address  Phone             Notes  °[facility]  - variety of support groups  336- 373-1402 Call for more information  °Narcotics Anonymous (NA), [facility] [address], °[place]  2 meetings at this location  ° °  Residential Treatment Programs °Organization         Address  Phone  Notes  °[facility] [address],    °[place]  1-866-801-8205   °[facility] ° [address], [place], [place] 704-293-8524   °[facility] [address], [place] 336-845-3988 Admissions: 8am-3pm M-F  °[facility] [address]    °[place], [place] 336-841-1104   °[facility] [address], [place], [place] 336-379-7146   °[facility] [address]  °[place], [place] 336-285-9073   °[facility] Intensive Outpatient [address], [place], [place] 336-852-3033   °[facility] [address]  °[place], [place] 1-877-615-2722 or 336-784-9470   °[facility] [address] [place], [place] 336-227-7417 Accepts Medicaid  °[facility] [address]  ° [place] 1-800-659-3381 Substance Abuse/Addiction Treatment  ° °[place] Behavioral Health Resources °Organization         Address  Phone  Notes  °[facility]  (888) 581-9988   °Julie Brannon, PhD [address] [place], [place]   (336) 349-5553 or (336) 951-0000   °[facility]   [address] °[place], [place] (336) 349-4454   °[facility] [address], [place], [place] (336) 342-8316 Insurance/Medicaid/sponsorship through [facility]  °[facility] [address]                                    [place], [place] (336) 342-8316 Therapy/tele-psych/case    °[facility] [address]  ° [place], [place] (336) 349-2233    °Dr. Arfeen  (336) 349-4544   °[facility] [place]  [employer] [place] [facility] 1) [address], [place] °2) [address], [place] °3)  [address], [place] (336) 349-3220 °(336) 342-7768 ° °(336) 342-8140   °[place] [facility] (336) 342-1394 or (336) 342-3537 (After Hours)    ° ° °

## 2015-01-15 NOTE — ED Notes (Signed)
Patient is on cardiac monitor at this time.

## 2015-01-15 NOTE — ED Provider Notes (Signed)
CSN: [id number]     Arrival date & time 01/15/15  2001 History   First MD Initiated Contact with Patient 01/15/15 2028     Chief Complaint  Patient presents with  . Panic Attack     (Consider location/radiation/quality/duration/timing/severity/associated sxs/prior Treatment) Patient is a 24 y.o. male presenting with anxiety.  Anxiety This is a recurrent problem. The current episode started less than 1 hour ago. The problem occurs constantly. The problem has been rapidly improving. Associated symptoms include chest pain and shortness of breath. Pertinent negatives include no abdominal pain and no headaches. Nothing aggravates the symptoms. Relieved by: time and deep breath. He has tried nothing for the symptoms.    Past Medical History  Diagnosis Date  . Seasonal allergies   . ADD (attention deficit disorder)   . Depression   . Anxiety    History reviewed. No pertinent past surgical history. Family History  Problem Relation Age of Onset  . Cancer Other   . Diabetes Other    Social History  Substance Use Topics  . Smoking status: Never Smoker   . Smokeless tobacco: Current User    Types: Chew, Snuff  . Alcohol Use: No    Review of Systems  Respiratory: Positive for shortness of breath.   Cardiovascular: Positive for chest pain and palpitations.  Gastrointestinal: Negative for abdominal pain.  Neurological: Negative for headaches.  Psychiatric/Behavioral: Positive for decreased concentration. The patient is nervous/anxious.   All other systems reviewed and are negative.     Allergies  Iohexol  Home Medications   Prior to Admission medications   Medication Sig Start Date End Date Taking? Authorizing Provider  amphetamine-dextroamphetamine (ADDERALL) 20 MG tablet Take 40 mg by mouth 2 (two) times daily.   Yes Historical Provider, MD  amphetamine-dextroamphetamine (ADDERALL) 10 MG tablet Take 1 tablet (10 mg) three times today 12-13-14: #3 tablets Take 1 tablet (10  mg) twice daily on 12-14-14: #2 tablets Take 1 tablet (10 mg) once daily on 12-15-14: #1 tablet: Amphetamine taper 12/13/14   Sanjuana Kava, NP  hydrOXYzine (ATARAX/VISTARIL) 50 MG tablet Take 1 tablet (50 mg total) by mouth every 6 (six) hours as needed for anxiety (sleep). 12/13/14   Sanjuana Kava, NP  ibuprofen (ADVIL,MOTRIN) 800 MG tablet Take 1 tablet (800 mg total) by mouth every 8 (eight) hours as needed for mild pain. 12/13/14   Sanjuana Kava, NP  LORazepam (ATIVAN) 1 MG tablet Take 1 tablet (1 mg) three times daily today 12-13-14: #3 tablets Take 1 tablet (1 mg) two times daily on 12-14-14: #2 tablets. Take 1 tablet (1 mg) once on 12-16-14 #1 tablet: #1 tablet. Take  1/2 tablet (0.5 mg) bid on 12-17-14) #1 tablet (may half this for patient). Take 1/2 tablet (0.5 mg) once on 12-18-14: Ativan dose taper. 12/13/14   Sanjuana Kava, NP  mirtazapine (REMERON) 15 MG tablet Take 1 tablet (15 mg total) by mouth at bedtime. For depression/sleep 12/13/14   Sanjuana Kava, NP  nicotine polacrilex (NICORETTE) 2 MG gum Take 1 each (2 mg total) by mouth as needed for smoking cessation. 12/13/14   Sanjuana Kava, NP   BP 120/68 mmHg  Pulse 68  Temp(Src) 98.6 F (37 C) (Oral)  Resp 16  Ht  (1.778 m)  Wt 120 lb (54.432 kg)  BMI 17.22 kg/m2  SpO2 100% Physical Exam  Constitutional: He is oriented to person, place, and time. He appears well-developed and well-nourished.  HENT:  Head: Normocephalic  and atraumatic.  Eyes: Conjunctivae and EOM are normal.  Neck: Normal range of motion. Neck supple.  Cardiovascular: Normal rate and regular rhythm.   Pulmonary/Chest: Effort normal. No respiratory distress.  Abdominal: Soft. There is no tenderness.  Musculoskeletal: Normal range of motion. He exhibits no edema or tenderness.  Neurological: He is alert and oriented to person, place, and time.  Skin: Skin is warm and dry.  Nursing note and vitals reviewed.   ED Course  Procedures (including critical  care time) Labs Review Labs Reviewed - No data to display  Imaging Review No results found. I, Sunita Demond, Barbara Cower, personally reviewed and evaluated these images and lab results as part of my medical decision-making.   EKG Interpretation None      MDM   Final diagnoses:  Anxiety    24 year old male with a history of anxiety, depression, ADD with here with likely panic attack today consisting of shortness of breath, chest pain, palpitations, severe anxiety, thought that he isn't I. Has had a lot of stressors recently. T1 relatively quickly and has resolved on its own. Initially was tachycardic EKG showed sinus tachycardia however this resolved prior to my evaluation when his heart rate was 83 when I walk in the room. Patient had slight tremulousness so given a dose of Ativan and this improved. Patient was observed in the emergency department for approximately an hour and his symptoms had basically resolved. I doubt that he had any head arrhythmia, ACS event, hyperthyroidism or other for his anxiety attack. Information given for outpatient resources as patient is newly homeless. Patient was appreciative and discharged in stable condition.  I have personally and contemperaneously reviewed labs and imaging and used in my decision making as above.   A medical screening exam was performed and I feel the patient has had an appropriate workup for their chief complaint at this time and likelihood of emergent condition existing is low. They have been counseled on decision, discharge, follow up and which symptoms necessitate immediate return to the emergency department. They or their family verbally stated understanding and agreement with plan and discharged in stable condition.      Marily Memos, MD 01/15/15 2214

## 2015-01-17 ENCOUNTER — Emergency Department (HOSPITAL_COMMUNITY)
Admission: EM | Admit: 2015-01-17 | Discharge: 2015-01-17 | Disposition: A | Discharge status: Home / Self Care | Payer: Self-pay | Attending: Emergency Medicine | Admitting: Emergency Medicine

## 2015-01-17 ENCOUNTER — Encounter (HOSPITAL_COMMUNITY): Payer: Self-pay | Admitting: *Deleted

## 2015-01-17 ENCOUNTER — Emergency Department (HOSPITAL_COMMUNITY): Payer: Self-pay

## 2015-01-17 DIAGNOSIS — Y999 Unspecified external cause status: Secondary | ICD-10-CM | POA: Insufficient documentation

## 2015-01-17 DIAGNOSIS — F13120 Sedative, hypnotic or anxiolytic abuse with intoxication, uncomplicated: Secondary | ICD-10-CM | POA: Insufficient documentation

## 2015-01-17 DIAGNOSIS — F329 Major depressive disorder, single episode, unspecified: Secondary | ICD-10-CM | POA: Insufficient documentation

## 2015-01-17 DIAGNOSIS — Y939 Activity, unspecified: Secondary | ICD-10-CM | POA: Insufficient documentation

## 2015-01-17 DIAGNOSIS — R000 Tachycardia, unspecified: Secondary | ICD-10-CM | POA: Insufficient documentation

## 2015-01-17 DIAGNOSIS — F909 Attention-deficit hyperactivity disorder, unspecified type: Secondary | ICD-10-CM | POA: Insufficient documentation

## 2015-01-17 DIAGNOSIS — Z79899 Other long term (current) drug therapy: Secondary | ICD-10-CM | POA: Insufficient documentation

## 2015-01-17 DIAGNOSIS — Y929 Unspecified place or not applicable: Secondary | ICD-10-CM | POA: Insufficient documentation

## 2015-01-17 DIAGNOSIS — S8001XA Contusion of right knee, initial encounter: Secondary | ICD-10-CM | POA: Insufficient documentation

## 2015-01-17 DIAGNOSIS — F1410 Cocaine abuse, uncomplicated: Secondary | ICD-10-CM | POA: Insufficient documentation

## 2015-01-17 DIAGNOSIS — F419 Anxiety disorder, unspecified: Secondary | ICD-10-CM | POA: Insufficient documentation

## 2015-01-17 DIAGNOSIS — X58XXXA Exposure to other specified factors, initial encounter: Secondary | ICD-10-CM | POA: Insufficient documentation

## 2015-01-17 DIAGNOSIS — F1110 Opioid abuse, uncomplicated: Secondary | ICD-10-CM | POA: Insufficient documentation

## 2015-01-17 LAB — ETHANOL

## 2015-01-17 LAB — I-STAT CHEM 8, ED
BUN: 10 mg/dL (ref 6–20)
CHLORIDE: 104 mmol/L (ref 101–111)
CREATININE: 1 mg/dL (ref 0.61–1.24)
Calcium, Ion: 1.19 mmol/L (ref 1.12–1.23)
GLUCOSE: 185 mg/dL — AB (ref 65–99)
HCT: 47 % (ref 39.0–52.0)
HEMOGLOBIN: 16 g/dL (ref 13.0–17.0)
Potassium: 4.1 mmol/L (ref 3.5–5.1)
Sodium: 140 mmol/L (ref 135–145)
TCO2: 19 mmol/L (ref 0–100)

## 2015-01-17 LAB — RAPID URINE DRUG SCREEN, HOSP PERFORMED
AMPHETAMINES: NOT DETECTED
BARBITURATES: NOT DETECTED
BENZODIAZEPINES: POSITIVE — AB
Cocaine: POSITIVE — AB
Opiates: POSITIVE — AB
TETRAHYDROCANNABINOL: NOT DETECTED

## 2015-01-17 MED ORDER — PROMETHAZINE HCL 25 MG/ML IJ SOLN: 25.0000 mg | INTRAMUSCULAR | Status: DC | PRN

## 2015-01-17 MED ORDER — LORAZEPAM 2 MG/ML IJ SOLN
1.0000 mg | INTRAMUSCULAR | Status: AC
  Administered 2015-01-17 (×2): 1 mg via INTRAVENOUS
  Filled 2015-01-17 (×2): qty 1

## 2015-01-17 MED ORDER — SODIUM CHLORIDE 0.9 % IV BOLUS (SEPSIS): 1000.0000 mL | Freq: Once | INTRAVENOUS | Status: DC

## 2015-01-17 MED ORDER — FENTANYL CITRATE (PF) 100 MCG/2ML IJ SOLN
50.0000 ug | Freq: Once | INTRAMUSCULAR | Status: AC
  Administered 2015-01-17: 50 ug via INTRAVENOUS
  Filled 2015-01-17: qty 2

## 2015-01-17 MED ORDER — FENTANYL CITRATE (PF) 100 MCG/2ML IJ SOLN: 100.0000 ug | Freq: Once | INTRAMUSCULAR | Status: DC

## 2015-01-17 MED ORDER — HYDROMORPHONE HCL 1 MG/ML IJ SOLN
0.5000 mg | Freq: Once | INTRAMUSCULAR | Status: AC
  Administered 2015-01-17: 0.5 mg via INTRAVENOUS
  Filled 2015-01-17: qty 1

## 2015-01-17 NOTE — ED Notes (Signed)
Patient requesting something for pain. MD made aware. No new orders given.

## 2015-01-17 NOTE — ED Notes (Signed)
Patient requesting something for pain. MD made aware.  

## 2015-01-17 NOTE — ED Provider Notes (Signed)
CSN: [id number]     Arrival date & time 01/17/15  1528 History   First MD Initiated Contact with Patient 01/17/15 1529     Chief Complaint  Patient presents with  . Drug Problem      HPI Pt comes in from home by EMS for "snorting cocaine" At 1500. Per pt he got it from someone and his cocaine "is laced with something. " Pt has increased HR of 160-180. Pt is alert and oriented at this time.  Past Medical History  Diagnosis Date  . Seasonal allergies   . ADD (attention deficit disorder)   . Depression   . Anxiety    History reviewed. No pertinent past surgical history. Family History  Problem Relation Age of Onset  . Cancer Other   . Diabetes Other    Social History  Substance Use Topics  . Smoking status: Never Smoker   . Smokeless tobacco: Current User    Types: Chew, Snuff  . Alcohol Use: No    Review of Systems  Unable to perform ROS: Acuity of condition      Allergies  Iohexol  Home Medications   Prior to Admission medications   Medication Sig Start Date End Date Taking? Authorizing Provider  amphetamine-dextroamphetamine (ADDERALL) 20 MG tablet Take 40 mg by mouth 2 (two) times daily.   Yes Historical Provider, MD  amphetamine-dextroamphetamine (ADDERALL) 10 MG tablet Take 1 tablet (10 mg) three times today 12-13-14: #3 tablets Take 1 tablet (10 mg) twice daily on 12-14-14: #2 tablets Take 1 tablet (10 mg) once daily on 12-15-14: #1 tablet: Amphetamine taper Patient not taking: Reported on 01/17/2015 12/13/14   Sanjuana Kava, NP  hydrOXYzine (ATARAX/VISTARIL) 50 MG tablet Take 1 tablet (50 mg total) by mouth every 6 (six) hours as needed for anxiety (sleep). Patient not taking: Reported on 01/17/2015 12/13/14   Sanjuana Kava, NP  ibuprofen (ADVIL,MOTRIN) 800 MG tablet Take 1 tablet (800 mg total) by mouth every 8 (eight) hours as needed for mild pain. Patient not taking: Reported on 01/17/2015 12/13/14   Sanjuana Kava, NP  LORazepam (ATIVAN) 1 MG tablet Take 1  tablet (1 mg) three times daily today 12-13-14: #3 tablets Take 1 tablet (1 mg) two times daily on 12-14-14: #2 tablets. Take 1 tablet (1 mg) once on 12-16-14 #1 tablet: #1 tablet. Take  1/2 tablet (0.5 mg) bid on 12-17-14) #1 tablet (may half this for patient). Take 1/2 tablet (0.5 mg) once on 12-18-14: Ativan dose taper. Patient not taking: Reported on 01/17/2015 12/13/14   Sanjuana Kava, NP  mirtazapine (REMERON) 15 MG tablet Take 1 tablet (15 mg total) by mouth at bedtime. For depression/sleep Patient not taking: Reported on 01/17/2015 12/13/14   Sanjuana Kava, NP  nicotine polacrilex (NICORETTE) 2 MG gum Take 1 each (2 mg total) by mouth as needed for smoking cessation. Patient not taking: Reported on 01/17/2015 12/13/14   Sanjuana Kava, NP   BP 117/59 mmHg  Pulse 63  Temp(Src) 98 F (36.7 C) (Oral)  Resp 20  Ht 5\' 10"  (1.778 m)  Wt 120 lb (54.432 kg)  BMI 17.22 kg/m2  SpO2 100% Physical Exam  Constitutional: He is oriented to person, place, and time. He appears well-developed and well-nourished. No distress.  HENT:  Head: Normocephalic and atraumatic.  Eyes: Pupils are equal, round, and reactive to light.  Neck: Normal range of motion.  Cardiovascular: Intact distal pulses.  Tachycardia present.   Pulmonary/Chest: No respiratory distress.  Abdominal: Normal appearance. He exhibits no distension.  Musculoskeletal: Normal range of motion.  Neurological: He is alert and oriented to person, place, and time. He displays tremor. No cranial nerve deficit. GCS eye subscore is 4. GCS verbal subscore is 5. GCS motor subscore is 6.  Skin: Skin is warm and dry. No rash noted.  Psychiatric: His behavior is normal. His mood appears anxious. His speech is rapid and/or pressured.  Nursing note and vitals reviewed.   ED Course  Procedures (including critical care time) Labs Review Labs Reviewed  URINE RAPID DRUG SCREEN, HOSP PERFORMED - Abnormal; Notable for the following:    Opiates POSITIVE  (*)    Cocaine POSITIVE (*)    Benzodiazepines POSITIVE (*)    All other components within normal limits  I-STAT CHEM 8, ED - Abnormal; Notable for the following:    Glucose, Bld 185 (*)    All other components within normal limits  ETHANOL    Imaging Review No results found. I, Lamija Besse L, personally reviewed and evaluated these images and lab results as part of my medical decision-making.   EKG Interpretation   Date/Time:  Monday January 17 2015 15:29:57 EDT Ventricular Rate:  155 PR Interval:  124 QRS Duration: 86 QT Interval:  258 QTC Calculation: 414 R Axis:   98 Text Interpretation:  Sinus tachycardia Rightward axis Borderline ECG ED  PHYSICIAN INTERPRETATION AVAILABLE IN [facility] Confirmed by TEST,  Record ([id number]) on 01/18/2015 6:30:40 AM      MDM   Final diagnoses:  Cocaine abuse  Knee contusion, right, initial encounter        Nelva Nay, MD 01/29/15 [phone]

## 2015-01-17 NOTE — ED Notes (Signed)
Ice pack given to patient and applied to right knee as requested by patient.

## 2015-01-17 NOTE — ED Notes (Addendum)
Patient left before receiving d/c papers.

## 2015-01-17 NOTE — ED Notes (Signed)
Pt comes in from home by EMS for "snorting cocaine"  At 1500. Per pt he got it from someone and his cocaine "is laced with something. "  Pt has increased HR of 160-180. Pt is alert and oriented at this time.

## 2015-01-17 NOTE — ED Notes (Signed)
Patient walking around in room, refuse knee immobilizer.

## 2015-01-17 NOTE — Discharge Instructions (Signed)
Drug Abuse and Addiction in Sports °There are many types of drugs that one may become addicted to including illegal drugs (marijuana, cocaine, amphetamines, hallucinogens, and narcotics), prescription drugs (hydrocodone, codeine, and alprazolam), and other chemicals such as alcohol or nicotine. °Two types of addiction exist: physical and emotional. Physical addiction usually occurs after prolonged use of a drug. However, some drugs may only take a couple uses before addiction can occur. Physical addiction is marked by withdrawal symptoms in which the person experiences negative symptoms such as sweat, anxiety, tremors, hallucinations, or cravings in the absence of using the drug. Emotional dependence is the psychological desire for the "high" that the drugs produce when taken. °SYMPTOMS  °· Inattentiveness. °· Negligence. °· Forgetfulness. °· Insomnia. °· Mood swings. °RISK INCREASES WITH:  °· Family history of addiction. °· Personal history of addictive personality. °Studies have shown that risk takers, which many athletes are, have a higher risk of addiction. °PREVENTION °The only adequate prevention of drug abuse is abstinence from drugs. °TREATMENT  °The first step in quitting substance abuse is recognizing the problem and realizing that one has the power to change. Quitting requires a plan and support from others. It is often necessary to seek medical assistance. Caregivers are available to offer counseling, and for certain cases, medicine to diminish the physical symptoms of withdrawal. Many organizations exist such as Alcoholics Anonymous, Narcotics Anonymous, or the National Council on Alcoholism that offer support for individuals who have chosen to quit their habits. °Document Released: 05/21/2005 Document Revised: 10/05/2013 Document Reviewed: 09/02/2008 °ExitCare® Patient Information ©2015 [employer]. This information is not intended to replace advice given to you by your health care provider. Make  sure you discuss any questions you have with your health care provider. ° ° °Emergency Department Resource Guide °1) Find a Doctor and Pay Out of Pocket °Although you won't have to find out who is covered by your insurance plan, it is a good idea to ask around and get recommendations. You will then need to call the office and see if the doctor you have chosen will accept you as a new patient and what types of options they offer for patients who are self-pay. Some doctors offer discounts or will set up payment plans for their patients who do not have insurance, but you will need to ask so you aren't surprised when you get to your appointment. ° °2) Contact Your Local Health Department °Not all health departments have doctors that can see patients for sick visits, but many do, so it is worth a call to see if yours does. If you don't know where your local health department is, you can check in your phone book. The CDC also has a tool to help you locate your state's health department, and many state websites also have listings of all of their local health departments. ° °3) Find a Walk-in Clinic °If your illness is not likely to be very severe or complicated, you may want to try a walk in clinic. These are popping up all over the country in pharmacies, drugstores, and shopping centers. They're usually staffed by nurse practitioners or physician assistants that have been trained to treat common illnesses and complaints. They're usually fairly quick and inexpensive. However, if you have serious medical issues or chronic medical problems, these are probably not your best option. ° °No Primary Care Doctor: °- Call [facility] at  832-8000 - they can help you locate a primary care doctor that  accepts your insurance, provides   certain services, etc. °- Physician Referral Service- 1-800-533-3463 ° °Chronic Pain Problems: °Organization         Address  Phone   Notes  °[facility]  (336) 297-2271 Patients  need to be referred by their primary care doctor.  ° °Medication Assistance: °Organization         Address  Phone   Notes  °[place] [facility] [address] °[place], [place] [address] (336) 641-8030 --Must be a resident of [place] °-- Must have NO insurance coverage whatsoever (no Medicaid/ Medicare, etc.) °-- The pt. MUST have a primary care doctor that directs their care regularly and follows them in the community °  °[facility]  (866) 331-1348   °[employer]  (888) 892-1162   ° °Agencies that provide inexpensive medical care: °Organization         Address  Phone   Notes  °[facility]  (336) 832-8035   °[facility]    (336) 832-7272   °[facility] [address] °[place], [place] [address] (336) 832-4777   °[facility] [address], °[place] (336) 271-4999   °[facility]    (336) 373-0678   °[facility]    (336) 272-1050   °[facility] ° [address], [place] Phone:  (336) 832-4444, Fax:  (336) 832-4440 Hours of Operation:  9 am - 6 pm, M-F.  Also accepts Medicaid/Medicare and self-pay.  °[facility] ° [address], [place] Phone: (336) 832-3150, Fax: (336) 832-3151. Hours of Operation:  8:30 am - 5:30 pm, M-F.  Also accepts Medicaid and self-pay.  °[facility] [place] [address], [place] Phone: (336) 878-6027   °[facility] [address], [place], [place] (336)723-1848, Ext. 123 Mondays & Thursdays: 7-9 AM.  First 15 patients are seen on a first come, first serve basis. °  ° °Medicaid-accepting [place] Providers: ° °Organization         Address  Phone   Notes  °[facility] [address], [place] (336) 641-2100 Also accepts self-pay patients.  °[facility] [address], [place] ° (336) 856-9996     °[facility] [address], [place] (336) 288-8857   °[facility] [address] [place] [address], [place] (336) 299-7000   °Veita Bland [address], [place]  ° (336) 373-1557 Only accepts [place] Access Medicaid patients after they have their name applied to their card.  ° °Self-Pay (no insurance) in [place]: ° °Organization         Address  Phone   Notes  °Sickle Cell Patients, [facility] [address], [place] (336) 832-1970   °[facility] [address], [place] (336) 832-4400   °[facility] ° [address], [place] (336) 992-4800   °[facility]/Dr. Osei-Bonsu ° [address] [place] [address], [place] or [address], [place] (336) 841-8500 Phone number for both [place] and [place] locations is the same.  °[facility] [address], [place] (336) 299-0000   °[facility] [address] [place] [address],  or [address] (336) 852-7530 °(336) 878-2260   °[facility] [address], [place] (336) 350-1642, phone; (336) 294-5005, fax Sees patients 1st and 3rd Saturday of every month.  Must not qualify for public or private insurance (i.e. Medicaid, Medicare, [place] Health Choice, Veterans' Benefits) • Household income should be no more than 200% of the poverty level •The clinic cannot treat you if you are pregnant or think you are pregnant • Sexually transmitted diseases are not treated at the clinic.  ° ° °Dental Care: °Organization         Address  Phone  Notes  °[facility] [address], [place] (336) 641-6152 Accepts children up to age 21 who are enrolled in Medicaid or [place] Health Choice; pregnant women with a Medicaid card; and children who have applied for Medicaid or [place] Health Choice, but were declined,  whose parents can pay a reduced fee at time of service.  °[facility] [place]  [address], [place] (336) 641-7733 Accepts children up to age 21 who are enrolled in Medicaid or [place] Health Choice; pregnant women with a Medicaid card; and children who have applied for Medicaid or [place] Health Choice, but were declined, whose parents can pay a reduced fee at time of service.  °[facility] ° [address], [place] (336) 641-4533 Patients are seen by appointment only. Walk-ins are not accepted. [facility] will see patients 18 years of age and older. °Monday - Tuesday (8am-5pm) °Most Wednesdays (8:30-5pm) °$30 per visit, cash only  °[facility] ° [address], [place] (336) 641-4533 Patients are seen by appointment only. Walk-ins are not accepted. [facility] will see patients 18 years of age and older. °One Wednesday Evening (Monthly: Volunteer Based).  $30 per visit, cash only  °[facility]  (919) 537-3737 for adults; Children under age 4, call Graduate Pediatric Dentistry at ([phone]) [phone]. Children aged 4-14, please call ([phone]) [phone] to request a pediatric application. ° Dental services are provided in all areas of dental care including fillings, crowns and bridges, complete and partial dentures, implants, gum treatment, root canals, and extractions. Preventive care is also provided. Treatment is provided to both adults and children. °Patients are selected via a lottery and there is often a waiting list. °  °[facility] [address], °[place] ° (336) 763-8833 [email or web address] °  °[facility] [address], [place], [place] (336)723-1848, Ext. 123 Second and Fourth Thursday of each month, opens at 6:30 AM; Clinic ends at 9 AM.  Patients are seen on a first-come first-served basis, and a limited number are seen during each clinic.  ° °[facility] ° [address], [place], [place] (336) 723-7904   Eligibility Requirements °You must have lived in [place], [place], or [place] counties for at least the last three months. °  You cannot be eligible for state or federal sponsored healthcare insurance, including Veterans Administration, Medicaid, or Medicare. °  You generally cannot be eligible for healthcare insurance through your employer.  °  How to apply: °Eligibility screenings are held every Tuesday and Wednesday afternoon from 1:00 pm until 4:00 pm. You do not need an appointment for the interview!  °[facility] [address], [place],  336-631-2330   °[facility]  336-342-8273   °[facility]  336-703-3100   °[facility]  336-570-6415   ° °  Behavioral Health Resources in the Community: °Intensive Outpatient Programs °Organization         Address  Phone  Notes  °[facility] [address], [place], [place] 336-878-6098   °[facility] [address], [place], [place] 336-832-9800   °[facility] [address], [place], [place] ° 336-882-2125   °[facility] [address],  °[place], [place] 1-800-853-5163 or 336-641-4981   °Substance Abuse Resources °Organization         Address  Phone  Notes  °[facility]  336-882-2125   °[facility]  336-784-9470   °[facility]  336-285-9073   °[facility]  336-845-3988   °Residential & Outpatient Substance Abuse Program  1-800-659-3381   °Psychological Services °Organization         Address  Phone  Notes  °[facility]  336- 832-9600   °[facility]  336- 378-7881   °[facility] [address], [place] 1-800-853-5163 or 336-641-4981   ° °Mobile Crisis Teams °Organization         Address  Phone  Notes  °[facility], Mobile Crisis Care Unit   1-877-626-1772   °Assertive °[facility] ° [address] [place], [place] 336-834-9664   °Sharon DeEsch [address] °[place] 336-554-5454   ° °Self-Help/Support Groups °Organization         Address  Phone             Notes  °[facility] - variety of support groups  336- 373-1402 Call for more information  °Narcotics Anonymous (NA), [facility] [address], °[place]  2 meetings at this location  ° °Residential Treatment Programs °Organization         Address  Phone  Notes  °[facility] [address],    °[place]  1-866-801-8205   °[facility] ° [address], [place], [place] 704-293-8524   °[facility] [address], [place] 336-845-3988 Admissions: 8am-3pm M-F  °[facility] [address]    °[place], [place] 336-841-1104   °[facility] [address], [place], [place] 336-379-7146   °[facility] [address]  °[place], [place] 336-285-9073   °[facility] Intensive Outpatient [address], [place], [place] 336-852-3033   °[facility] [address]  °[place], [place] 1-877-615-2722 or 336-784-9470   °[facility] [address] , [place] 336-227-7417 Accepts Medicaid  °[facility] [address]  °[place] 1-800-659-3381 Substance Abuse/Addiction Treatment  ° °[place] Behavioral Health Resources °Organization         Address  Phone  Notes  °[facility]  (888) 581-9988   °Julie Brannon, PhD [address] [place], [place]   (336) 349-5553 or (336) 951-0000   °[facility]   [address] °[place], [place] (336) 349-4454   °[facility] [address], [place], [place] (336) 342-8316 Insurance/Medicaid/sponsorship through [facility]  °[facility] [address]                                     [place], [place] (336) 342-8316 Therapy/tele-psych/case  °[facility] [address]  °   [place], [place] (336) 349-2233    °Dr. Arfeen  (336) 349-4544   °[facility] [place]  [employer] [place] [facility] 1) [address],  °2) [address], [place] °3)  [address], [place] (336) 349-3220 °(336) 342-7768 ° °(336) 342-8140   °[place] [facility] (336) 342-1394 or (336) 342-3537 (After Hours)    ° ° ° °

## 2015-04-21 ENCOUNTER — Encounter (HOSPITAL_COMMUNITY): Payer: Self-pay

## 2015-04-21 ENCOUNTER — Emergency Department (HOSPITAL_COMMUNITY)
Admission: EM | Admit: 2015-04-21 | Discharge: 2015-04-21 | Disposition: A | Discharge status: Home / Self Care | Payer: Self-pay | Attending: Emergency Medicine | Admitting: Emergency Medicine

## 2015-04-21 DIAGNOSIS — F909 Attention-deficit hyperactivity disorder, unspecified type: Secondary | ICD-10-CM | POA: Insufficient documentation

## 2015-04-21 DIAGNOSIS — F410 Panic disorder [episodic paroxysmal anxiety] without agoraphobia: Secondary | ICD-10-CM

## 2015-04-21 DIAGNOSIS — R002 Palpitations: Secondary | ICD-10-CM | POA: Insufficient documentation

## 2015-04-21 LAB — RAPID URINE DRUG SCREEN, HOSP PERFORMED
Amphetamines: NOT DETECTED
BARBITURATES: NOT DETECTED
BENZODIAZEPINES: NOT DETECTED
COCAINE: NOT DETECTED
OPIATES: NOT DETECTED
TETRAHYDROCANNABINOL: NOT DETECTED

## 2015-04-21 LAB — BASIC METABOLIC PANEL
Anion gap: 7 (ref 5–15)
BUN: 12 mg/dL (ref 6–20)
CALCIUM: 10.2 mg/dL (ref 8.9–10.3)
CO2: 23 mmol/L (ref 22–32)
CREATININE: 0.98 mg/dL (ref 0.61–1.24)
Chloride: 109 mmol/L (ref 101–111)
GFR calc Af Amer: >60 mL/min (ref >60)
GLUCOSE: 100 mg/dL — AB (ref 65–99)
POTASSIUM: 4.2 mmol/L (ref 3.5–5.1)
SODIUM: 139 mmol/L (ref 135–145)

## 2015-04-21 LAB — TSH: TSH: 1.352 u[IU]/mL (ref 0.350–4.500)

## 2015-04-21 MED ORDER — LORAZEPAM 1 MG PO TABS: 1.0000 mg | ORAL_TABLET | Freq: Three times a day (TID) | ORAL | Status: DC | PRN

## 2015-04-21 MED ORDER — LORAZEPAM 1 MG PO TABS
1.0000 mg | ORAL_TABLET | Freq: Once | ORAL | Status: AC
  Administered 2015-04-21: 1 mg via ORAL
  Filled 2015-04-21: qty 1

## 2015-04-21 MED ORDER — HYDROXYZINE HCL 25 MG PO TABS: 25.0000 mg | ORAL_TABLET | Freq: Four times a day (QID) | ORAL | Status: DC

## 2015-04-21 MED ORDER — HYDROXYZINE HCL 50 MG/ML IM SOLN
50.0000 mg | Freq: Four times a day (QID) | INTRAMUSCULAR | Status: DC | PRN
  Administered 2015-04-21: 50 mg via INTRAMUSCULAR
  Filled 2015-04-21: qty 1

## 2015-04-21 NOTE — ED Notes (Signed)
Pt says has panic attacks daily.  Reports felt anxious and heart racing since last night.  Says presently isnt taking anything for anxiety.

## 2015-04-21 NOTE — Discharge Instructions (Signed)
Panic Attacks °Panic attacks are sudden, short-lived surges of severe anxiety, fear, or discomfort. They may occur for no reason when you are relaxed, when you are anxious, or when you are sleeping. Panic attacks may occur for a number of reasons:  °· Healthy people occasionally have panic attacks in extreme, life-threatening situations, such as war or natural disasters. Normal anxiety is a protective mechanism of the body that helps us react to danger (fight or flight response). °· Panic attacks are often seen with anxiety disorders, such as panic disorder, social anxiety disorder, generalized anxiety disorder, and phobias. Anxiety disorders cause excessive or uncontrollable anxiety. They may interfere with your relationships or other life activities. °· Panic attacks are sometimes seen with other mental illnesses, such as depression and posttraumatic stress disorder. °· Certain medical conditions, prescription medicines, and drugs of abuse can cause panic attacks. °SYMPTOMS  °Panic attacks start suddenly, peak within 20 minutes, and are accompanied by four or more of the following symptoms: °· Pounding heart or fast heart rate (palpitations). °· Sweating. °· Trembling or shaking. °· Shortness of breath or feeling smothered. °· Feeling choked. °· Chest pain or discomfort. °· Nausea or strange feeling in your stomach. °· Dizziness, light-headedness, or feeling like you will faint. °· Chills or hot flushes. °· Numbness or tingling in your lips or hands and feet. °· Feeling that things are not real or feeling that you are not yourself. °· Fear of losing control or going crazy. °· Fear of dying. °Some of these symptoms can mimic serious medical conditions. For example, you may think you are having a heart attack. Although panic attacks can be very scary, they are not life threatening. °DIAGNOSIS  °Panic attacks are diagnosed through an assessment by your health care provider. Your health care provider will ask  questions about your symptoms, such as where and when they occurred. Your health care provider will also ask about your medical history and use of alcohol and drugs, including prescription medicines. Your health care provider may order blood tests or other studies to rule out a serious medical condition. Your health care provider may refer you to a mental health professional for further evaluation. °TREATMENT  °· Most healthy people who have one or two panic attacks in an extreme, life-threatening situation will not require treatment. °· The treatment for panic attacks associated with anxiety disorders or other mental illness typically involves counseling with a mental health professional, medicine, or a combination of both. Your health care provider will help determine what treatment is best for you. °· Panic attacks due to physical illness usually go away with treatment of the illness. If prescription medicine is causing panic attacks, talk with your health care provider about stopping the medicine, decreasing the dose, or substituting another medicine. °· Panic attacks due to alcohol or drug abuse go away with abstinence. Some adults need professional help in order to stop drinking or using drugs. °HOME CARE INSTRUCTIONS  °· Take all medicines as directed by your health care provider.   °· Schedule and attend follow-up visits as directed by your health care provider. It is important to keep all your appointments. °SEEK MEDICAL CARE IF: °· You are not able to take your medicines as prescribed. °· Your symptoms do not improve or get worse. °SEEK IMMEDIATE MEDICAL CARE IF:  °· You experience panic attack symptoms that are different than your usual symptoms. °· You have serious thoughts about hurting yourself or others. °· You are taking medicine for panic attacks and   have a serious side effect. °MAKE SURE YOU: °· Understand these instructions. °· Will watch your condition. °· Will get help right away if you are not  doing well or get worse. °  °This information is not intended to replace advice given to you by your health care provider. Make sure you discuss any questions you have with your health care provider. °  °Document Released: 05/21/2005 Document Revised: 05/26/2013 Document Reviewed: 01/02/2013 °Elsevier Interactive Patient Education ©2016 Elsevier Inc. ° ° ° ° °Emergency Department Resource Guide °1) Find a Doctor and Pay Out of Pocket °Although you won't have to find out who is covered by your insurance plan, it is a good idea to ask around and get recommendations. You will then need to call the office and see if the doctor you have chosen will accept you as a new patient and what types of options they offer for patients who are self-pay. Some doctors offer discounts or will set up payment plans for their patients who do not have insurance, but you will need to ask so you aren't surprised when you get to your appointment. ° °2) Contact Your Local Health Department °Not all health departments have doctors that can see patients for sick visits, but many do, so it is worth a call to see if yours does. If you don't know where your local health department is, you can check in your phone book. The CDC also has a tool to help you locate your state's health department, and many state websites also have listings of all of their local health departments. ° °3) Find a Walk-in Clinic °If your illness is not likely to be very severe or complicated, you may want to try a walk in clinic. These are popping up all over the country in pharmacies, drugstores, and shopping centers. They're usually staffed by nurse practitioners or physician assistants that have been trained to treat common illnesses and complaints. They're usually fairly quick and inexpensive. However, if you have serious medical issues or chronic medical problems, these are probably not your best option. ° °No Primary Care Doctor: °- Call [facility] at  832-8000 -  they can help you locate a primary care doctor that  accepts your insurance, provides certain services, etc. °- Physician Referral Service- 1-800-533-3463 ° °Chronic Pain Problems: °Organization         Address  Phone   Notes  °[facility]  (336) 297-2271 Patients need to be referred by their primary care doctor.  ° °Medication Assistance: °Organization         Address  Phone   Notes  °[place] [facility] [address] °[place], [place] [address] (336) 641-8030 --Must be a resident of [place] °-- Must have NO insurance coverage whatsoever (no Medicaid/ Medicare, etc.) °-- The pt. MUST have a primary care doctor that directs their care regularly and follows them in the community °  °[facility]  (866) 331-1348   °[employer]  (888) 892-1162   ° °Agencies that provide inexpensive medical care: °Organization         Address  Phone   Notes  °[facility]  (336) 832-8035   °[facility]    (336) 832-7272   °[facility] [address] °[place], [place] [address] (336) 832-4777   °[facility] [address], °[place] (336) 271-4999   °[facility]    (336) 373-0678   °  [facility]    (336) 272-1050   °[facility] ° [address], [place] Phone:  (336) 832-4444, Fax:  (336) 832-4440 Hours of Operation:  9 am - 6 pm, M-F.  Also accepts Medicaid/Medicare and self-pay.  °[facility] ° [address], [place] Phone: (336) 832-3150, Fax: (336) 832-3151. Hours of Operation:  8:30 am - 5:30 pm, M-F.  Also accepts Medicaid and self-pay.  °[facility] [place] [address], [place] Phone: (336) 878-6027   °[facility] [address], [place], [place] (336)723-1848, Ext. 123 Mondays & Thursdays: 7-9 AM.  First 15 patients are seen on a first come, first serve basis. °  ° °Medicaid-accepting  [place] Providers: ° °Organization         Address  Phone   Notes  °[facility] [address], [place] (336) 641-2100 Also accepts self-pay patients.  °[facility] [address], [place] ° (336) 856-9996   °[facility] [address], [place] (336) 288-8857   °[facility] [address] [place] [address], [place] (336) 299-7000   °Veita Bland [address], [place]  ° (336) 373-1557 Only accepts [place] Access Medicaid patients after they have their name applied to their card.  ° °Self-Pay (no insurance) in [place]: ° °Organization         Address  Phone   Notes  °Sickle Cell Patients, [facility] [address], [place] (336) 832-1970   °[facility] [address], [place] (336) 832-4400   °[facility] ° [address],  (336) 992-4800   °[facility]/Dr. Osei-Bonsu ° [address] [place] [address], [place] or [address], [place] (336) 841-8500 Phone number for both [place] and [place] locations is the same.  °[facility] [address], [place] (336) 299-0000   °[facility] [address] [place] [address], [place] or [address] (336) 852-7530 °(336) 878-2260   °[facility] [address], [place] (336) 350-1642, phone; (336) 294-5005, fax Sees patients 1st and 3rd Saturday of every month.  Must not qualify for public or private insurance (i.e. Medicaid, Medicare, [place] Health Choice, Veterans' Benefits) • Household income should be no more than 200% of the poverty level •The clinic cannot treat you if you are pregnant or think you are pregnant • Sexually transmitted diseases are not treated at the clinic.  ° ° °Dental Care: °Organization         Address  Phone  Notes  °[place] [facility] [address],  (336) 641-6152 Accepts children up to age 21 who are enrolled in Medicaid or [place] Health Choice; pregnant women with a Medicaid card; and children who have applied for Medicaid or [place] Health Choice, but were declined, whose parents can pay a reduced fee at time of service.  °[facility] [place]  [address], [place] (336) 641-7733 Accepts children up to age 21 who are enrolled in Medicaid or [place] Health Choice; pregnant women with a Medicaid card; and children who have applied for Medicaid or [place] Health Choice, but were declined, whose parents can pay a reduced fee at time   of service.  °[facility] ° [address], [place] (336) 641-4533 Patients are seen by appointment only. Walk-ins are not accepted. [facility] will see patients 18 years of age and older. °Monday - Tuesday (8am-5pm) °Most Wednesdays (8:30-5pm) °$30 per visit, cash only  °[facility] ° [address], [place] (336) 641-4533 Patients are seen by appointment only. Walk-ins are not accepted. [facility] will see patients 18 years of age and older. °One Wednesday Evening (Monthly: Volunteer Based).  $30 per visit, cash only  °[facility]  (919) 537-3737 for adults; Children under age 4, call Graduate Pediatric Dentistry at (919) [phone]. Children aged 4-14, please call (919) [phone] to request a pediatric application. ° Dental services are provided in all areas of dental care including fillings, crowns and bridges, complete and partial dentures, implants, gum treatment, root canals, and extractions. Preventive care is also provided. Treatment is provided to both adults and children. °Patients are selected via a lottery and there is often a waiting list. °  °[facility] [address], °[place] ° (336) 763-8833 [email or web address] °  °[facility] [address], [place], [place] (336)723-1848, Ext. 123 Second and Fourth Thursday of each month, opens at 6:30 AM; Clinic ends at 9 AM.  Patients are seen on a first-come first-served basis, and a limited number are seen during each clinic.  ° °[facility] ° [address], [place], [place] (336) 723-7904   Eligibility Requirements °You must have lived in [place], or [place] counties for at least the last three months. °  You cannot be eligible for state or federal sponsored healthcare insurance, including Veterans Administration, Medicaid, or Medicare. °  You generally cannot be eligible for healthcare insurance through your employer.  °  How to apply: °Eligibility screenings are held every Tuesday and Wednesday afternoon from 1:00 pm until 4:00 pm. You do not need an appointment for the interview!  °[facility] [address], [place], [place] 336-631-2330   °[facility]  336-342-8273   °[facility]  336-703-3100   °[facility]  336-570-6415   ° °Behavioral Health Resources in the Community: °Intensive Outpatient Programs °Organization         Address  Phone  Notes  °[facility] [address], [place], [place] 336-878-6098   °[facility] [address], [place], [place] 336-832-9800   °[facility] [address], [place], [place] ° 336-882-2125   °[facility] [address],  °[place],  1-800-853-5163 or 336-641-4981   °Substance Abuse Resources °Organization         Address  Phone  Notes  °[facility]  336-882-2125   °[facility]  336-784-9470   °[facility]  336-285-9073   °[facility]  336-845-3988   °Residential & Outpatient Substance Abuse Program  1-800-659-3381   °Psychological Services °Organization         Address  Phone  Notes  °[facility]   336- 832-9600   °[facility]  336- 378-7881   °[facility] [address], [place] 1-800-853-5163 or 336-641-4981   ° °Mobile Crisis Teams °Organization         Address  Phone  Notes  °[facility], Mobile Crisis Care Unit  1-877-626-1772   °Assertive °[facility]otherapeutic   Services ° [address] [place], [place] 336-834-9664   °Sharon DeEsch [address] °[place] 336-554-5454   ° °Self-Help/Support Groups °Organization         Address  Phone             Notes  °[facility] - variety of support groups  336- 373-1402 Call for more information  °Narcotics Anonymous (NA), [facility] [address], °[place]  2 meetings at this location  ° °Residential Treatment Programs °Organization         Address  Phone  Notes  °[facility] [address],    °[place]  1-866-801-8205   °[facility] ° [address], [place], [place] 704-293-8524   °[facility] [address], [place] 336-845-3988 Admissions: 8am-3pm M-F  °[facility] [address]    °[place], [place] 336-841-1104   °[facility] [address], [place], [place] 336-379-7146   °[facility] [address]  °[place], [place] 336-285-9073   °[facility] - Intensive Outpatient [address], [place], [place] 336-852-3033   °[facility] [address]  °[place], [place] 1-877-615-2722 or 336-784-9470   °[facility] [address] [place], [place] 336-227-7417 Accepts Medicaid  °[facility] [address]  °[place] 1-800-659-3381 Substance Abuse/Addiction Treatment  ° °[place] Behavioral Health Resources °Organization         Address  Phone  Notes  °[facility]  (888) 581-9988   °Darely Becknell Brannon, PhD [address] [place], [place]   (336) 349-5553 or  (336) 951-0000   °[facility]   [address] °[place], [place] (336) 349-4454   °[facility] [address], [place], [place] (336) 342-8316 Insurance/Medicaid/sponsorship through [facility]  °[facility] [address]                                    [place], [place] (336) 342-8316 Therapy/tele-psych/case  °[facility] [address]  ° [place], [place] (336) 349-2233    °Dr. Arfeen  (336) 349-4544   °[facility] [place]  [employer] [place] [facility] 1) [address], [place] °2) [address], [place] °3)  [address], [place] (336) 349-3220 °(336) 342-7768 ° °(336) 342-8140   °[place] [facility] (336) 342-1394 or (336) 342-3537 (After Hours)    ° ° ° °

## 2015-04-22 NOTE — ED Provider Notes (Signed)
CSN: [id number]     Arrival date & time 04/21/15  [id number] History   First MD Initiated Contact with Patient 04/21/15 [phone]     Chief Complaint  Patient presents with  . Panic Attack     (Consider location/radiation/quality/duration/timing/severity/associated sxs/prior Treatment) The history is provided by the patient.   Alan Hess is a 24 y.o. male presenting with history of anxiety and worsening panic since last evening, including heart palpitations and inability to sleep.    He describes inability to sleep due to stress and anxiety.  He has had increasing personal stressors, considers himself homeless but currently residing with his sister.  He does have a history of adhd, but taking adderall made his anxiety worse.  He is not currently taking anything for anxiety, has had ativan in the past which was helpful.  [facility] but "they didn't do anything for me".  He denies suicidal/homicidal ideation.  He does have a history of cocaine abuse, denies any current drug or etoh use.    Past Medical History  Diagnosis Date  . Seasonal allergies   . ADD (attention deficit disorder)   . Depression   . Anxiety    History reviewed. No pertinent past surgical history. Family History  Problem Relation Age of Onset  . Cancer Other   . Diabetes Other    Social History  Substance Use Topics  . Smoking status: Never Smoker   . Smokeless tobacco: Current User    Types: Chew, Snuff  . Alcohol Use: No    Review of Systems  Constitutional: Negative for fever and chills.  HENT: Negative for congestion and sore throat.   Eyes: Negative.   Respiratory: Negative for chest tightness and shortness of breath.   Cardiovascular: Positive for palpitations. Negative for chest pain.  Gastrointestinal: Negative for nausea and abdominal pain.  Genitourinary: Negative.   Musculoskeletal: Negative.   Skin: Negative.   Neurological: Negative for dizziness,  weakness, light-headedness, numbness and headaches.  Psychiatric/Behavioral: Negative for hallucinations and self-injury. The patient is nervous/anxious.       Allergies  Iohexol  Home Medications   Prior to Admission medications   Medication Sig Start Date End Date Taking? Authorizing Provider  amphetamine-dextroamphetamine (ADDERALL) 10 MG tablet Take 1 tablet (10 mg) three times today 12-13-14: #3 tablets Take 1 tablet (10 mg) twice daily on 12-14-14: #2 tablets Take 1 tablet (10 mg) once daily on 12-15-14: #1 tablet: Amphetamine taper Patient not taking: Reported on 01/17/2015 12/13/14   Sanjuana KavaAgnes I Nwoko, NP  ibuprofen (ADVIL,MOTRIN) 800 MG tablet Take 1 tablet (800 mg total) by mouth every 8 (eight) hours as needed for mild pain. Patient not taking: Reported on 01/17/2015 12/13/14   Sanjuana KavaAgnes I Nwoko, NP  LORazepam (ATIVAN) 1 MG tablet Take 1 tablet (1 mg total) by mouth 3 (three) times daily as needed for anxiety. 04/21/15   Burgess AmorJulie Dawana Asper, PA-C  mirtazapine (REMERON) 15 MG tablet Take 1 tablet (15 mg total) by mouth at bedtime. For depression/sleep Patient not taking: Reported on 01/17/2015 12/13/14   Sanjuana KavaAgnes I Nwoko, NP  nicotine polacrilex (NICORETTE) 2 MG gum Take 1 each (2 mg total) by mouth as needed for smoking cessation. Patient not taking: Reported on 01/17/2015 12/13/14   Sanjuana KavaAgnes I Nwoko, NP   BP 107/61 mmHg  Pulse 73  Temp(Src) 99.1 F (37.3 C) (Oral)  Resp 16  Ht 5\' 10"  (1.778 m)  Wt 130 lb (58.968 kg)  BMI 18.65 kg/m2  SpO2 100% Physical Exam  Constitutional: He is oriented to person, place, and time. He appears well-developed and well-nourished.  HENT:  Head: Normocephalic and atraumatic.  Eyes: EOM are normal. Pupils are equal, round, and reactive to light.  Neck: Normal range of motion.  Cardiovascular: Normal rate, regular rhythm and normal heart sounds.   Pulmonary/Chest: Effort normal and breath sounds normal.  Musculoskeletal: Normal range of motion.  Neurological: He is  alert and oriented to person, place, and time. No cranial nerve deficit.  Skin: Skin is warm and dry.  Psychiatric: His behavior is normal. Thought content normal. His mood appears anxious. His affect is not inappropriate. His speech is not rapid and/or pressured. Cognition and memory are normal. He expresses no homicidal and no suicidal ideation.  Nursing note and vitals reviewed.   ED Course  Procedures (including critical care time) Labs Review Labs Reviewed  BASIC METABOLIC PANEL - Abnormal; Notable for the following:    Glucose, Bld 100 (*)    All other components within normal limits  URINE RAPID DRUG SCREEN, HOSP PERFORMED  TSH    Imaging Review No results found. I have personally reviewed and evaluated these images and lab results as part of my medical decision-making.   EKG Interpretation   Date/Time:  Thursday April 21 2015 09:52:47 EST Ventricular Rate:  97 PR Interval:  136 QRS Duration: 104 QT Interval:  343 QTC Calculation: 436 R Axis:   86 Text Interpretation:  Sinus rhythm RSR' in V1 or V2, right VCD or RVH  Borderline T abnormalities, anterior leads Sinus rhythm RSR' pattern in V1  Left ventricular hypertrophy Abnormal ekg Confirmed by Gerhard Munch   MD [phone]) on 04/21/2015 10:07:05 AM      MDM   Final diagnoses:  Anxiety attack    Pt with history of anxiety and panic, no homicidal, suicidal ideation, no psychosis.  He was given ativan 1 mg tablet, after one hour, reports only mild improvement, although tachycardia improved, does not appear anxious on re-exam.  Vistaril 50 mg IM given without any additional benefit.  Pt was given local referrals for psych care, prescribed #12 ativan, advised he must f/u with psychology and/or return to [facility] for further management of his sx, cannot treat anxiety in the ed, however advised to return for any worsened or new sx.  Recommended [facility] for f/u, info given.     Burgess Amor, PA-C 04/22/15  [id number]  Gerhard Munch, MD 04/22/15 [phone]

## 2016-12-08 ENCOUNTER — Emergency Department (HOSPITAL_COMMUNITY): Payer: Self-pay

## 2016-12-08 ENCOUNTER — Encounter (HOSPITAL_COMMUNITY): Payer: Self-pay

## 2016-12-08 ENCOUNTER — Emergency Department (HOSPITAL_COMMUNITY)
Admission: EM | Admit: 2016-12-08 | Discharge: 2016-12-08 | Disposition: A | Discharge status: Home / Self Care | Payer: Self-pay | Attending: Emergency Medicine | Admitting: Emergency Medicine

## 2016-12-08 DIAGNOSIS — F17220 Nicotine dependence, chewing tobacco, uncomplicated: Secondary | ICD-10-CM | POA: Insufficient documentation

## 2016-12-08 DIAGNOSIS — K5289 Other specified noninfective gastroenteritis and colitis: Secondary | ICD-10-CM | POA: Insufficient documentation

## 2016-12-08 DIAGNOSIS — K529 Noninfective gastroenteritis and colitis, unspecified: Secondary | ICD-10-CM

## 2016-12-08 LAB — DIFFERENTIAL
BASOS ABS: 0 10*3/uL (ref 0.0–0.1)
Basophils Relative: 0 %
EOS PCT: 0 %
Eosinophils Absolute: 0 10*3/uL (ref 0.0–0.7)
LYMPHS ABS: 0.7 10*3/uL (ref 0.7–4.0)
LYMPHS PCT: 5 %
Monocytes Absolute: 0.6 10*3/uL (ref 0.1–1.0)
Monocytes Relative: 5 %
NEUTROS ABS: 12.4 10*3/uL — AB (ref 1.7–7.7)
NEUTROS PCT: 90 %

## 2016-12-08 LAB — URINALYSIS, ROUTINE W REFLEX MICROSCOPIC
BACTERIA UA: NONE SEEN
Glucose, UA: NEGATIVE mg/dL
KETONES UR: NEGATIVE mg/dL
Leukocytes, UA: NEGATIVE
Nitrite: NEGATIVE
PH: 5 (ref 5.0–8.0)
Protein, ur: NEGATIVE mg/dL
Specific Gravity, Urine: 1.029 (ref 1.005–1.030)

## 2016-12-08 LAB — COMPREHENSIVE METABOLIC PANEL
ALK PHOS: 75 U/L (ref 38–126)
ALT: 14 U/L — ABNORMAL LOW (ref 17–63)
ANION GAP: 11 (ref 5–15)
AST: 21 U/L (ref 15–41)
Albumin: 4.6 g/dL (ref 3.5–5.0)
BUN: 19 mg/dL (ref 6–20)
CALCIUM: 11 mg/dL — AB (ref 8.9–10.3)
CO2: 25 mmol/L (ref 22–32)
Chloride: 101 mmol/L (ref 101–111)
Creatinine, Ser: 1.17 mg/dL (ref 0.61–1.24)
GFR calc non Af Amer: >60 mL/min (ref >60)
GLUCOSE: 164 mg/dL — AB (ref 65–99)
POTASSIUM: 3.6 mmol/L (ref 3.5–5.1)
SODIUM: 137 mmol/L (ref 135–145)
Total Bilirubin: 1.7 mg/dL — ABNORMAL HIGH (ref 0.3–1.2)
Total Protein: 7.4 g/dL (ref 6.5–8.1)

## 2016-12-08 LAB — CBC
HEMATOCRIT: 47.6 % (ref 39.0–52.0)
HEMOGLOBIN: 17 g/dL (ref 13.0–17.0)
MCH: 31.1 pg (ref 26.0–34.0)
MCHC: 35.7 g/dL (ref 30.0–36.0)
MCV: 87.2 fL (ref 78.0–100.0)
Platelets: 221 10*3/uL (ref 150–400)
RBC: 5.46 MIL/uL (ref 4.22–5.81)
RDW: 12.1 % (ref 11.5–15.5)
WBC: 13.8 10*3/uL — ABNORMAL HIGH (ref 4.0–10.5)

## 2016-12-08 LAB — LIPASE, BLOOD: LIPASE: 22 U/L (ref 11–51)

## 2016-12-08 MED ORDER — CIPROFLOXACIN HCL 500 MG PO TABS: 500.0000 mg | ORAL_TABLET | Freq: Two times a day (BID) | ORAL | 0 refills | Status: DC

## 2016-12-08 MED ORDER — ONDANSETRON HCL 4 MG PO TABS: 4.0000 mg | ORAL_TABLET | Freq: Three times a day (TID) | ORAL | 0 refills | Status: DC | PRN

## 2016-12-08 MED ORDER — MORPHINE SULFATE (PF) 4 MG/ML IV SOLN
4.0000 mg | Freq: Once | INTRAVENOUS | Status: AC
  Administered 2016-12-08: 4 mg via INTRAVENOUS
  Filled 2016-12-08: qty 1

## 2016-12-08 MED ORDER — SODIUM CHLORIDE 0.9 % IV BOLUS (SEPSIS)
1000.0000 mL | Freq: Once | INTRAVENOUS | Status: AC
  Administered 2016-12-08: 1000 mL via INTRAVENOUS

## 2016-12-08 MED ORDER — HYDROCODONE-ACETAMINOPHEN 5-325 MG PO TABS: 1.0000 | ORAL_TABLET | Freq: Four times a day (QID) | ORAL | 0 refills | Status: DC | PRN

## 2016-12-08 MED ORDER — METRONIDAZOLE 500 MG PO TABS: 500.0000 mg | ORAL_TABLET | Freq: Two times a day (BID) | ORAL | 0 refills | Status: DC

## 2016-12-08 MED ORDER — ONDANSETRON HCL 4 MG/2ML IJ SOLN
4.0000 mg | Freq: Once | INTRAMUSCULAR | Status: AC
  Administered 2016-12-08: 4 mg via INTRAVENOUS
  Filled 2016-12-08: qty 2

## 2016-12-08 NOTE — ED Provider Notes (Signed)
[facility]-EMERGENCY DEPT Provider Note   CSN: [id number] Arrival date & time: 12/08/16  1319     History   Chief Complaint Chief Complaint  Patient presents with  . Abdominal Pain    HPI Alan Hess is a 26 y.o. male.  The history is provided by the patient.  Abdominal Pain   This is a new problem. The current episode started 3 to 5 hours ago. The problem occurs constantly. The problem has been gradually worsening. The pain is associated with an unknown factor. The pain is located in the RLQ, LLQ and suprapubic region. The quality of the pain is throbbing, sharp, aching and cramping. The pain is severe. Associated symptoms include nausea and constipation. Pertinent negatives include fever, diarrhea, hematochezia, melena, vomiting, dysuria and frequency. The symptoms are aggravated by palpation and certain positions. Nothing relieves the symptoms.  26 year old male who presents with severe lower abdominal pain starting at 9 AM this morning. Pain cramping, sharp, without any alleviating factors or aggravating factors. Has not had similar pain in the past. No previous abdominal surgeries. Associated nausea reported but no vomiting, diarrhea, fevers or chills. Reports last bowel movement was 2-3 days ago, and it is normal for him to have a bowel movement every 2-3 days. No dysuria, urinary frequency, urinary retention, hematuria.   Past Medical History:  Diagnosis Date  . ADD (attention deficit disorder)   . Anxiety   . Depression   . Seasonal allergies     Patient Active Problem List   Diagnosis Date Noted  . Opiate misuse   . Acute stress disorder 12/07/2014  . 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 Attention deficit hyperactivity disorder (ADHD), combined type   . ADHD (attention deficit hyperactivity disorder) 10/07/2014  . PTSD (post-traumatic stress disorder) 10/07/2014  . Substance abuse 10/07/2014  . Seasonal allergies   . ADD (attention deficit disorder)      History reviewed. No pertinent surgical history.     Home Medications    Prior to Admission medications   Medication Sig Start Date End Date Taking? Authorizing Provider  ciprofloxacin (CIPRO) 500 MG tablet Take 1 tablet (500 mg total) by mouth 2 (two) times daily. 12/08/16   Lavera Guise, MD  HYDROcodone-acetaminophen (NORCO/VICODIN) 5-325 MG tablet Take 1-2 tablets by mouth every 6 (six) hours as needed for moderate pain or severe pain. 12/08/16   Lavera Guise, MD  metroNIDAZOLE (FLAGYL) 500 MG tablet Take 1 tablet (500 mg total) by mouth 2 (two) times daily. 12/08/16   Lavera Guise, MD  ondansetron (ZOFRAN) 4 MG tablet Take 1 tablet (4 mg total) by mouth every 8 (eight) hours as needed for nausea or vomiting. 12/08/16   Lavera Guise, MD    Family History Family History  Problem Relation Age of Onset  . Cancer Other   . Diabetes Other     Social History Social History  Substance Use Topics  . Smoking status: Never Smoker  . Smokeless tobacco: Current User    Types: Chew, Snuff  . Alcohol use No     Allergies   Iohexol   Review of Systems Review of Systems  Constitutional: Negative for fever.  Gastrointestinal: Positive for abdominal pain, constipation and nausea. Negative for diarrhea, hematochezia, melena and vomiting.  Genitourinary: Negative for dysuria and frequency.  All other systems reviewed and are negative.    Physical Exam Updated Vital Signs BP 115/69   Pulse (!) 50   Temp 97.7 F (36.5 C) (Oral)  Resp 20   Ht 5\' 10"  (1.778 m)   Wt 59 kg (130 lb)   SpO2 100%   BMI 18.65 kg/m   Physical Exam Physical Exam  Nursing note and vitals reviewed. Constitutional: Well developed, well nourished, non-toxic, and appears very comfortable secondary to pain Head: Normocephalic and atraumatic.  Mouth/Throat: Oropharynx is clear and moist.  Neck: Normal range of motion. Neck supple.  Cardiovascular: Normal rate and regular rhythm.   Pulmonary/Chest:  Effort normal and breath sounds normal.  Abdominal: Soft. There is low abdominal tenderness. There is no rebound and no guarding.  Musculoskeletal: Normal range of motion.  Neurological: Alert, no facial droop, fluent speech, moves all extremities symmetrically Skin: Skin is warm and dry.  Psychiatric: Cooperative   ED Treatments / Results  Labs (all labs ordered are listed, but only abnormal results are displayed) Labs Reviewed  COMPREHENSIVE METABOLIC PANEL - Abnormal; Notable for the following:       Result Value   Glucose, Bld 164 (*)    Calcium 11.0 (*)    ALT 14 (*)    Total Bilirubin 1.7 (*)    All other components within normal limits  CBC - Abnormal; Notable for the following:    WBC 13.8 (*)    All other components within normal limits  URINALYSIS, ROUTINE W REFLEX MICROSCOPIC - Abnormal; Notable for the following:    Color, Urine AMBER (*)    APPearance HAZY (*)    Hgb urine dipstick SMALL (*)    Bilirubin Urine SMALL (*)    Squamous Epithelial / LPF 0-5 (*)    All other components within normal limits  DIFFERENTIAL - Abnormal; Notable for the following:    Neutro Abs 12.4 (*)    All other components within normal limits  LIPASE, BLOOD    EKG  EKG Interpretation None       Radiology Ct Abdomen Pelvis Wo Contrast  Result Date: 12/08/2016 CLINICAL DATA:  Lower abdominal pain since this morning. EXAM: CT ABDOMEN AND PELVIS WITHOUT CONTRAST TECHNIQUE: Multidetector CT imaging of the abdomen and pelvis was performed following the standard protocol without IV contrast. COMPARISON:  08/07/2014 FINDINGS: Lower chest:  Unremarkable. Hepatobiliary: No focal abnormality in the liver on this study without intravenous contrast. There is no evidence for gallstones, gallbladder wall thickening, or pericholecystic fluid. No intrahepatic or extrahepatic biliary dilation. Pancreas: No focal mass lesion. No dilatation of the main duct. No intraparenchymal cyst. No peripancreatic  edema. Spleen: No splenomegaly. No focal mass lesion. Adrenals/Urinary Tract: No adrenal nodule or mass. Kidneys are unremarkable, without evidence for stone disease or hydronephrosis. No evidence for hydroureter. The urinary bladder appears normal for the degree of distention. Stomach/Bowel: Stomach is nondistended. No gastric wall thickening. No evidence of outlet obstruction. Duodenum is normally positioned as is the ligament of Treitz. No small bowel wall thickening. No small bowel dilatation. The terminal ileum is normal. The appendix is normal. Right colon and transverse colon are unremarkable. Left colon is completely collapsed which makes assessment of wall thickness difficult. There is a suggestion of mild circumferential wall thickening in the region of the splenic flexure (image 32 series 2) and the wall of the descending colon is ill-defined. Vascular/Lymphatic: No abdominal aortic aneurysm. No abdominal aortic atherosclerotic calcification. There is no gastrohepatic or hepatoduodenal ligament lymphadenopathy. No intraperitoneal or retroperitoneal lymphadenopathy. No pelvic sidewall lymphadenopathy. Reproductive: The prostate gland and seminal vesicles have normal imaging features. Other: No intraperitoneal free fluid. Musculoskeletal: Bone windows reveal no worrisome  lytic or sclerotic osseous lesions. IMPRESSION: 1. Oral contrast material has not reached the colon by the time of scanning. The left colon is completely decompressed which makes assessment of wall thickness difficult and unreliable, but there is a suggestion of mild circumferential wall thickened starting in the splenic flexure and extending down into the rectum. While not a definite , the appearance does raise the question of left-sided colitis. 2. Otherwise unremarkable noncontrast CT scan of the abdomen and pelvis. Electronically Signed   By: Kennith CenterEric  Mansell M.D.   On: 12/08/2016 17:13    Procedures Procedures (including critical care  time)  Medications Ordered in ED Medications  sodium chloride 0.9 % bolus 1,000 mL (0 mLs Intravenous Stopped 12/08/16 1450)  ondansetron (ZOFRAN) injection 4 mg (4 mg Intravenous Given 12/08/16 1400)  morphine 4 MG/ML injection 4 mg (4 mg Intravenous Given 12/08/16 1400)  morphine 4 MG/ML injection 4 mg (4 mg Intravenous Given 12/08/16 1533)     Initial Impression / Assessment and Plan / ED Course  I have reviewed the triage vital signs and the nursing notes.  Pertinent labs & imaging results that were available during my care of the patient were reviewed by me and considered in my medical decision making (see chart for details).     Patient appearing very uncomfortable secondary to pain. Has a soft and non-peritoneal abdomen, but low abdominal tenderness to palpation. Will give IV Fluids, analgesics, and anti-emetics. We'll obtain basic blood work and obtain CT abdomen and pelvis to evaluate for early appendicitis, colitis, or other serious intra-abdominal processes.  Ct visualized with possible left-sided colitis. We'll start on antibiotics and discharged with analgesics and antiemetics. Symptoms have been better controlled in the ED.  Patient to follow-up at the health [facility] but GI referral also given as needed make sure he has resolution of his colitis. Strict return and follow-up instructions reviewed. He expressed understanding of all discharge instructions and felt comfortable with the plan of care.   Final Clinical Impressions(s) / ED Diagnoses   Final diagnoses:  Colitis    New Prescriptions New Prescriptions   CIPROFLOXACIN (CIPRO) 500 MG TABLET    Take 1 tablet (500 mg total) by mouth 2 (two) times daily.   HYDROCODONE-ACETAMINOPHEN (NORCO/VICODIN) 5-325 MG TABLET    Take 1-2 tablets by mouth every 6 (six) hours as needed for moderate pain or severe pain.   METRONIDAZOLE (FLAGYL) 500 MG TABLET    Take 1 tablet (500 mg total) by mouth 2 (two) times daily.   ONDANSETRON  (ZOFRAN) 4 MG TABLET    Take 1 tablet (4 mg total) by mouth every 8 (eight) hours as needed for nausea or vomiting.     Lavera GuiseLiu, Joshus Rogan Duo, MD 12/08/16 [phone]

## 2016-12-08 NOTE — Discharge Instructions (Signed)
Your CT scan shows possible inflammation of the colon that could be infectious. Take antibiotics as prescribed. Return for worsening symptoms, including fever, escalating pain, intractable vomiting or any other symptoms concerning to you.  You are given referral to see GI doctor to make sure this resolves

## 2016-12-08 NOTE — ED Triage Notes (Signed)
Lower abdominal pain since this morning with nausea.

## 2016-12-25 ENCOUNTER — Telehealth: Payer: Self-pay | Admitting: Nurse Practitioner

## 2016-12-25 NOTE — Telephone Encounter (Signed)
He was previously accepted as a new patient and had an appt made but was a no show. Ok to accept in [place], sending as cc to CM to sign off.

## 2016-12-25 NOTE — Telephone Encounter (Signed)
Can we accept him as a new patient?  

## 2016-12-26 ENCOUNTER — Encounter: Payer: Self-pay | Admitting: Internal Medicine

## 2016-12-26 NOTE — Telephone Encounter (Signed)
OV made and letter mailed °

## 2016-12-26 NOTE — Telephone Encounter (Signed)
Routing to Susan. 

## 2016-12-26 NOTE — Telephone Encounter (Signed)
Reviewed. Ok to accept as a new patient

## 2017-02-18 ENCOUNTER — Ambulatory Visit: Payer: Self-pay | Admitting: Nurse Practitioner

## 2017-02-18 ENCOUNTER — Encounter: Payer: Self-pay | Admitting: Nurse Practitioner

## 2017-02-18 ENCOUNTER — Telehealth: Payer: Self-pay | Admitting: Nurse Practitioner

## 2017-02-18 NOTE — Telephone Encounter (Signed)
PATIENT WAS A NO SHOW AND LETTER SENT  °

## 2017-02-18 NOTE — Telephone Encounter (Signed)
Noted  
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
## 2017-04-09 IMAGING — CR DG THORACIC SPINE 2V
3 series · 3 of 3 positions shown · non-contrast
Comparison: PA and lateral chest 12/04/2008.

ADDENDUM:
This addendum is given for the purpose of noting the patient had
three views of the thoracic spine taken.
CLINICAL DATA: Generalized back pain today. No recent injury.
Initial encounter.

EXAM:
THORACIC SPINE - 2-3 VIEWS

[t thoracic spine ap]
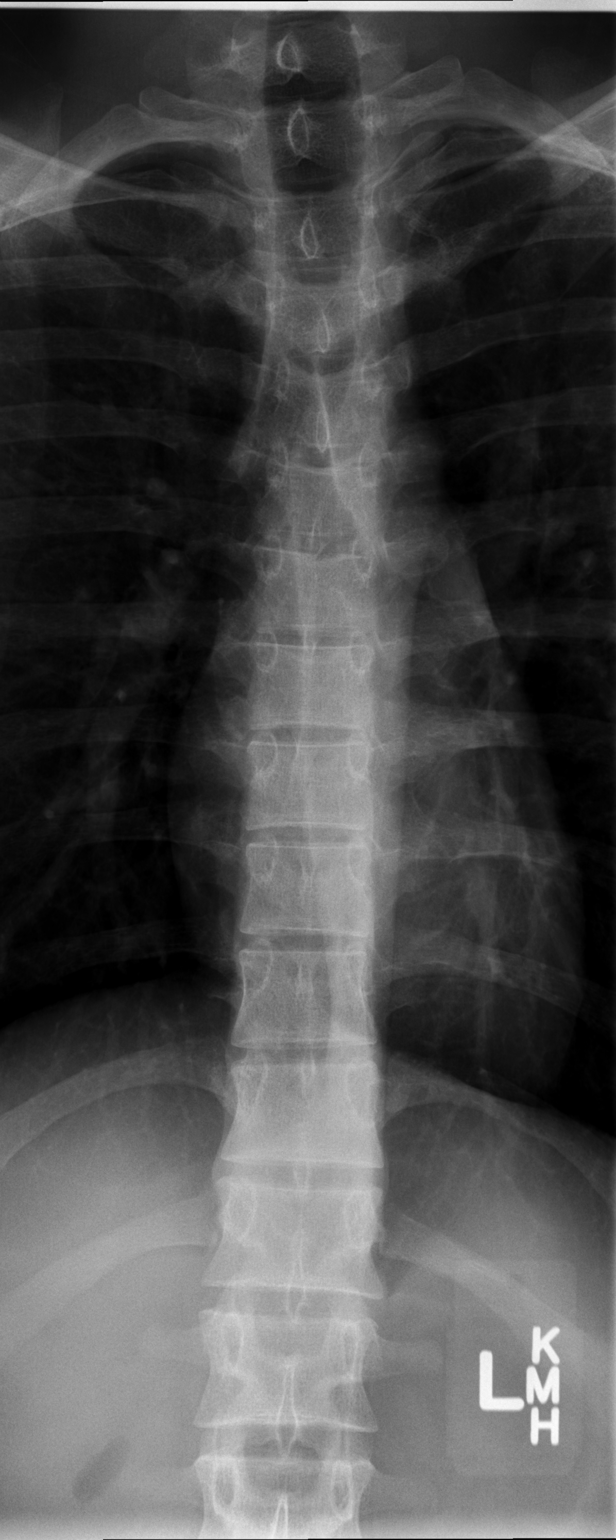

[t thoracic spine lat]
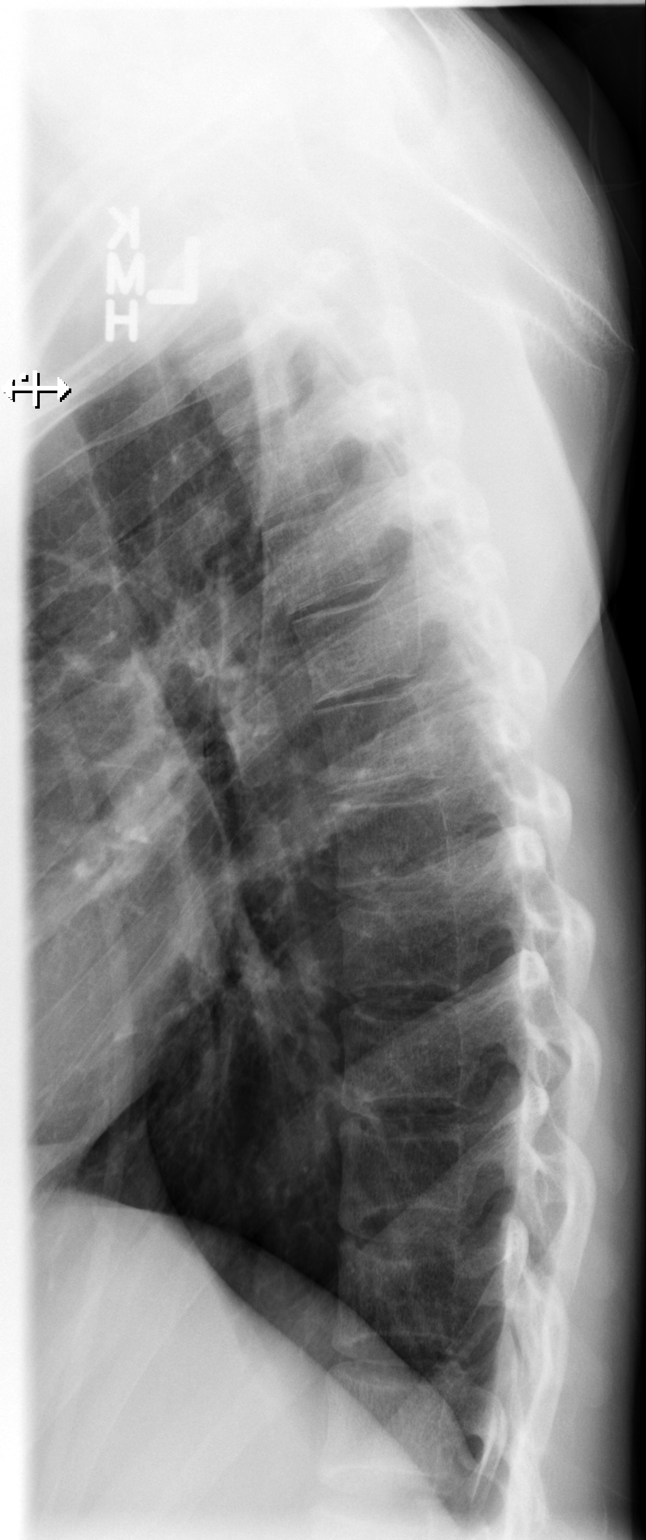

[t thoracic swimmers]
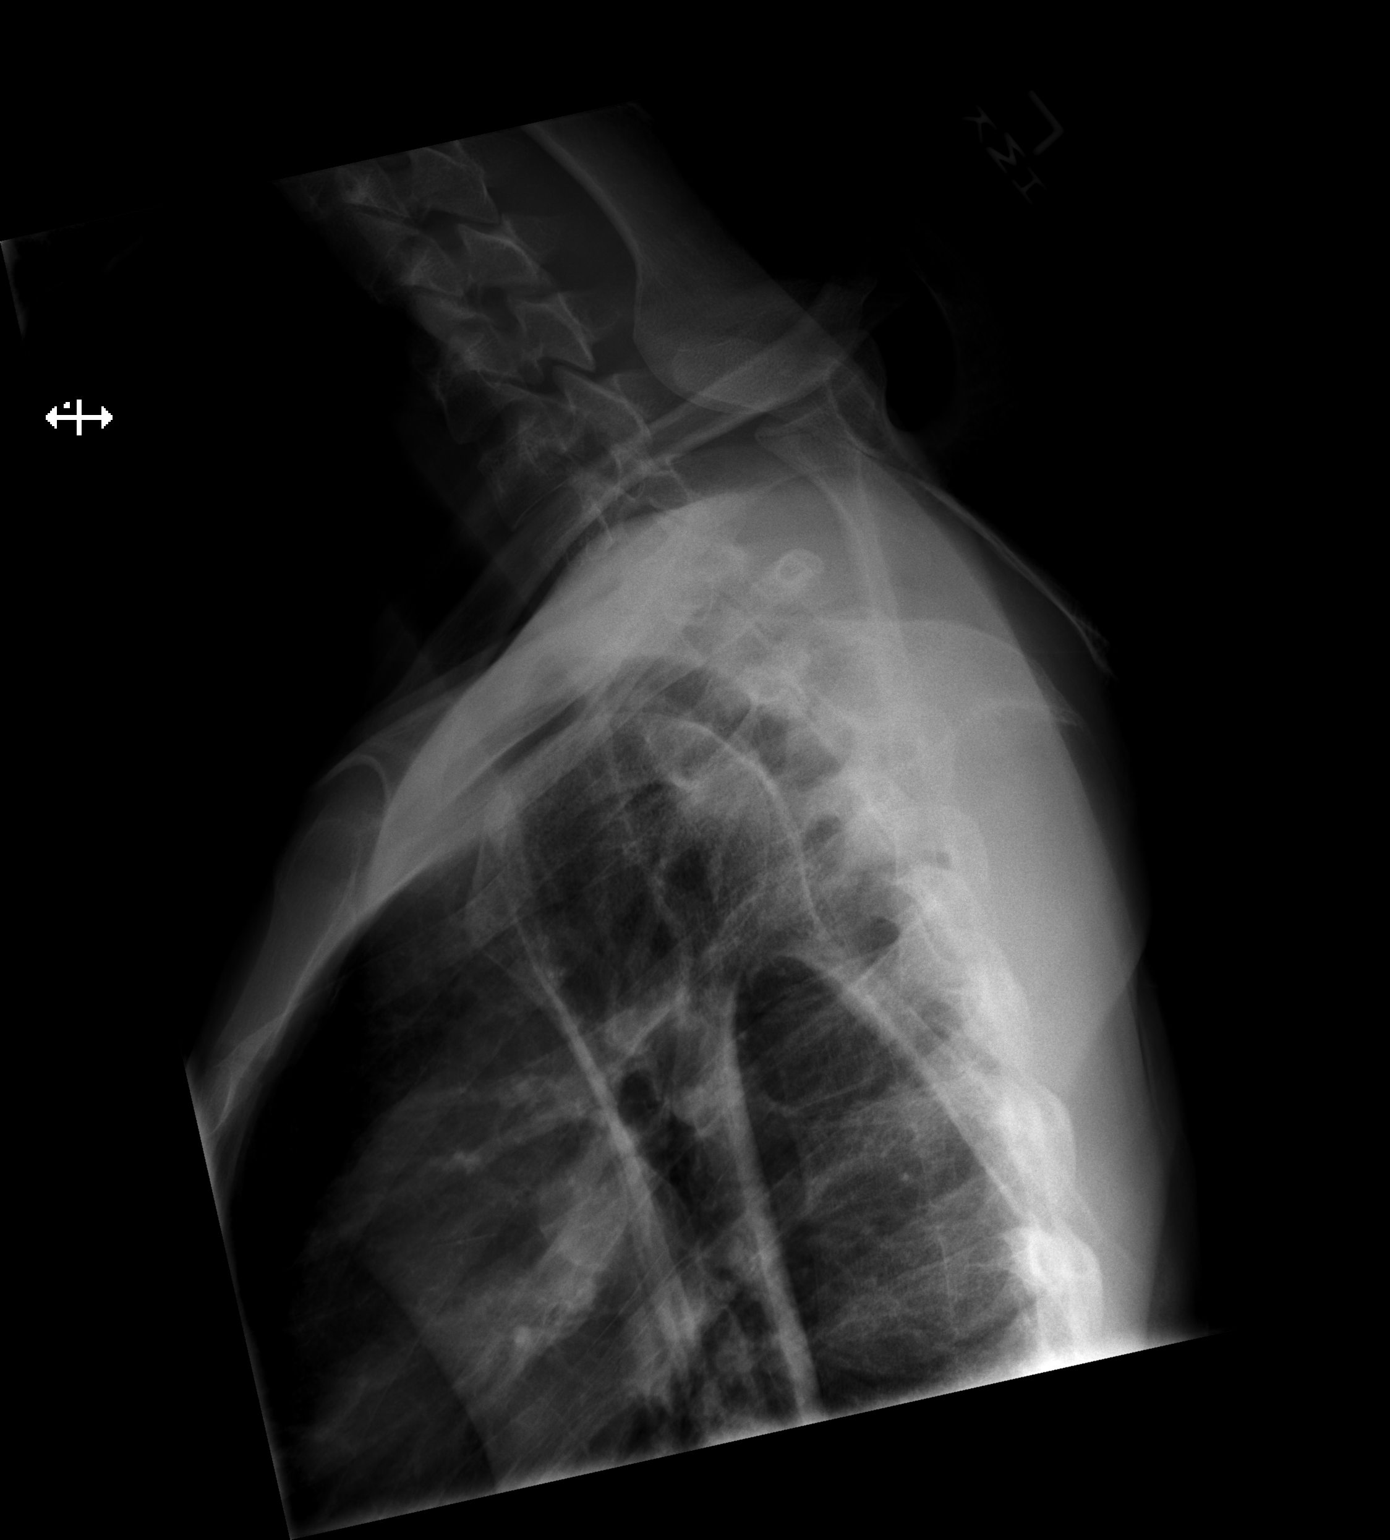

[3 of 3 positions shown; findings below may reference images not displayed]

FINDINGS: There is no evidence of thoracic spine fracture. Alignment is
normal. No other significant bone abnormalities are identified.
IMPRESSION: Negative exam.

## 2018-01-01 ENCOUNTER — Encounter (HOSPITAL_COMMUNITY): Payer: Self-pay

## 2018-01-01 ENCOUNTER — Other Ambulatory Visit: Payer: Self-pay

## 2018-01-01 ENCOUNTER — Emergency Department (HOSPITAL_COMMUNITY)
Admission: EM | Admit: 2018-01-01 | Discharge: 2018-01-02 | Disposition: A | Discharge status: Home / Self Care | Payer: Self-pay | Attending: Emergency Medicine | Admitting: Emergency Medicine

## 2018-01-01 DIAGNOSIS — F329 Major depressive disorder, single episode, unspecified: Secondary | ICD-10-CM | POA: Insufficient documentation

## 2018-01-01 DIAGNOSIS — F1510 Other stimulant abuse, uncomplicated: Secondary | ICD-10-CM

## 2018-01-01 DIAGNOSIS — Z79899 Other long term (current) drug therapy: Secondary | ICD-10-CM | POA: Insufficient documentation

## 2018-01-01 NOTE — ED Triage Notes (Signed)
Pt states he wants help to get off of drugs. Pt states he has been using "Ice", marijuana.   Pt denies current SI or HI, but states he has had those thoughts in the past.

## 2018-01-02 ENCOUNTER — Other Ambulatory Visit: Payer: Self-pay

## 2018-01-02 ENCOUNTER — Emergency Department (HOSPITAL_COMMUNITY)
Admission: EM | Admit: 2018-01-02 | Discharge: 2018-01-03 | Disposition: A | Discharge status: Home / Self Care | Payer: Self-pay | Attending: Emergency Medicine | Admitting: Emergency Medicine

## 2018-01-02 ENCOUNTER — Encounter (HOSPITAL_COMMUNITY): Payer: Self-pay | Admitting: Emergency Medicine

## 2018-01-02 DIAGNOSIS — R45851 Suicidal ideations: Secondary | ICD-10-CM | POA: Insufficient documentation

## 2018-01-02 DIAGNOSIS — F332 Major depressive disorder, recurrent severe without psychotic features: Secondary | ICD-10-CM | POA: Insufficient documentation

## 2018-01-02 DIAGNOSIS — F1520 Other stimulant dependence, uncomplicated: Secondary | ICD-10-CM | POA: Insufficient documentation

## 2018-01-02 DIAGNOSIS — F1910 Other psychoactive substance abuse, uncomplicated: Secondary | ICD-10-CM | POA: Insufficient documentation

## 2018-01-02 LAB — COMPREHENSIVE METABOLIC PANEL
ALBUMIN: 3.6 g/dL (ref 3.5–5.0)
ALT: 14 U/L (ref 0–44)
ALT: 14 U/L (ref 0–44)
ANION GAP: 7 (ref 5–15)
ANION GAP: 6 (ref 5–15)
AST: 13 U/L — ABNORMAL LOW (ref 15–41)
AST: 16 U/L (ref 15–41)
Albumin: 3.7 g/dL (ref 3.5–5.0)
Alkaline Phosphatase: 48 U/L (ref 38–126)
Alkaline Phosphatase: 55 U/L (ref 38–126)
BILIRUBIN TOTAL: 1.1 mg/dL (ref 0.3–1.2)
BUN: 12 mg/dL (ref 6–20)
BUN: 8 mg/dL (ref 6–20)
CALCIUM: 8.5 mg/dL — AB (ref 8.9–10.3)
CO2: 25 mmol/L (ref 22–32)
CO2: 27 mmol/L (ref 22–32)
CREATININE: 0.91 mg/dL (ref 0.61–1.24)
Calcium: 8.4 mg/dL — ABNORMAL LOW (ref 8.9–10.3)
Chloride: 105 mmol/L (ref 98–111)
Chloride: 108 mmol/L (ref 98–111)
Creatinine, Ser: 0.82 mg/dL (ref 0.61–1.24)
GFR calc Af Amer: >60 mL/min (ref >60)
GFR calc non Af Amer: >60 mL/min (ref >60)
Glucose, Bld: 122 mg/dL — ABNORMAL HIGH (ref 70–99)
Glucose, Bld: 84 mg/dL (ref 70–99)
POTASSIUM: 4 mmol/L (ref 3.5–5.1)
Potassium: 3.6 mmol/L (ref 3.5–5.1)
SODIUM: 138 mmol/L (ref 135–145)
Sodium: 140 mmol/L (ref 135–145)
TOTAL PROTEIN: 6 g/dL — AB (ref 6.5–8.1)
Total Bilirubin: 2.1 mg/dL — ABNORMAL HIGH (ref 0.3–1.2)
Total Protein: 5.8 g/dL — ABNORMAL LOW (ref 6.5–8.1)

## 2018-01-02 LAB — CBC
HCT: 40 % (ref 39.0–52.0)
HEMATOCRIT: 43.5 % (ref 39.0–52.0)
HEMOGLOBIN: 13.6 g/dL (ref 13.0–17.0)
Hemoglobin: 14.4 g/dL (ref 13.0–17.0)
MCH: 30.8 pg (ref 26.0–34.0)
MCH: 31.3 pg (ref 26.0–34.0)
MCHC: 33.1 g/dL (ref 30.0–36.0)
MCHC: 34 g/dL (ref 30.0–36.0)
MCV: 93.1 fL (ref 78.0–100.0)
MCV: 92 fL (ref 78.0–100.0)
PLATELETS: 368 10*3/uL (ref 150–400)
Platelets: 350 10*3/uL (ref 150–400)
RBC: 4.67 MIL/uL (ref 4.22–5.81)
RBC: 4.35 MIL/uL (ref 4.22–5.81)
RDW: 12.9 % (ref 11.5–15.5)
RDW: 13.1 % (ref 11.5–15.5)
WBC: 11 10*3/uL — ABNORMAL HIGH (ref 4.0–10.5)
WBC: 8.1 10*3/uL (ref 4.0–10.5)

## 2018-01-02 LAB — RAPID URINE DRUG SCREEN, HOSP PERFORMED
AMPHETAMINES: POSITIVE — AB
Amphetamines: POSITIVE — AB
BARBITURATES: NOT DETECTED
BENZODIAZEPINES: NOT DETECTED
BENZODIAZEPINES: NOT DETECTED
Barbiturates: NOT DETECTED
Cocaine: NOT DETECTED
Cocaine: NOT DETECTED
OPIATES: NOT DETECTED
Opiates: NOT DETECTED
TETRAHYDROCANNABINOL: NOT DETECTED
Tetrahydrocannabinol: POSITIVE — AB

## 2018-01-02 LAB — ETHANOL: Alcohol, Ethyl (B): <10 mg/dL (ref <10)

## 2018-01-02 LAB — SALICYLATE LEVEL

## 2018-01-02 LAB — ACETAMINOPHEN LEVEL

## 2018-01-02 MED ORDER — ONDANSETRON HCL 4 MG PO TABS: 4.0000 mg | ORAL_TABLET | Freq: Three times a day (TID) | ORAL | Status: DC | PRN

## 2018-01-02 MED ORDER — ZOLPIDEM TARTRATE 5 MG PO TABS
5.0000 mg | ORAL_TABLET | Freq: Every evening | ORAL | Status: DC | PRN
  Administered 2018-01-02: 5 mg via ORAL
  Filled 2018-01-02: qty 1

## 2018-01-02 MED ORDER — HYDROXYZINE HCL 25 MG PO TABS
50.0000 mg | ORAL_TABLET | Freq: Three times a day (TID) | ORAL | Status: DC | PRN
  Administered 2018-01-02 – 2018-01-03 (×3): 50 mg via ORAL
  Filled 2018-01-02 (×3): qty 2

## 2018-01-02 MED ORDER — IBUPROFEN 400 MG PO TABS
600.0000 mg | ORAL_TABLET | Freq: Three times a day (TID) | ORAL | Status: DC | PRN
Start: 2018-01-02 — End: 2018-01-03

## 2018-01-02 NOTE — ED Provider Notes (Signed)
[facility] Provider Note   CSN: [id number] Arrival date & time: 01/02/18  1251     History   Chief Complaint Chief Complaint  Patient presents with  . V70.1    HPI Alan Hess is a 27 y.o. male.  HPI  27 year old male long history of substance abuse presents today stating that he wishes to harm himself.  He states he has nothing to live for and has no family that can help care for him.  He states that he would overdose on drugs, jump off a bridge, or get the cops to shoot him.  Past Medical History:  Diagnosis Date  . ADD (attention deficit disorder)   . Anxiety   . Depression   . Seasonal allergies     Patient Active Problem List   Diagnosis Date Noted  . Opiate misuse   . Acute stress disorder 12/07/2014  . 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 Attention deficit hyperactivity disorder (ADHD), combined type   . ADHD (attention deficit hyperactivity disorder) 10/07/2014  . PTSD (post-traumatic stress disorder) 10/07/2014  . Substance abuse (HCC) 10/07/2014  . Seasonal allergies   . ADD (attention deficit disorder)     History reviewed. No pertinent surgical history.      Home Medications    Prior to Admission medications   Medication Sig Start Date End Date Taking? Authorizing Provider  ciprofloxacin (CIPRO) 500 MG tablet Take 1 tablet (500 mg total) by mouth 2 (two) times daily. 12/08/16   Lavera GuiseLiu, Dana Duo, MD  HYDROcodone-acetaminophen (NORCO/VICODIN) 5-325 MG tablet Take 1-2 tablets by mouth every 6 (six) hours as needed for moderate pain or severe pain. 12/08/16   Lavera GuiseLiu, Dana Duo, MD  metroNIDAZOLE (FLAGYL) 500 MG tablet Take 1 tablet (500 mg total) by mouth 2 (two) times daily. 12/08/16   Lavera GuiseLiu, Dana Duo, MD  ondansetron (ZOFRAN) 4 MG tablet Take 1 tablet (4 mg total) by mouth every 8 (eight) hours as needed for nausea or vomiting. 12/08/16   Lavera GuiseLiu, Dana Duo, MD    Family History Family History  Problem Relation Age of Onset  . Cancer  Other   . Diabetes Other     Social History Social History   Tobacco Use  . Smoking status: Never Smoker  . Smokeless tobacco: Current User    Types: Chew, Snuff  Substance Use Topics  . Alcohol use: Yes  . Drug use: Yes    Types: Cocaine    Comment: last used meth and marijuana 2 days ago - 8/1     Allergies   Iohexol   Review of Systems Review of Systems  All other systems reviewed and are negative.    Physical Exam Updated Vital Signs BP 116/64 (BP Location: Right Arm)   Pulse 96   Temp 97.9 F (36.6 C) (Oral)   Resp 20   SpO2 97%   Physical Exam  Constitutional: He is oriented to person, place, and time. He appears well-developed and well-nourished.  HENT:  Head: Normocephalic and atraumatic.  Right Ear: External ear normal.  Left Ear: External ear normal.  Eyes: Pupils are equal, round, and reactive to light. EOM are normal.  Neck: Normal range of motion. Neck supple.  Cardiovascular: Normal rate, regular rhythm and normal heart sounds.  Pulmonary/Chest: Effort normal and breath sounds normal.  Abdominal: Soft. Bowel sounds are normal.  Musculoskeletal: Normal range of motion.  Neurological: He is alert and oriented to person, place, and time.  Skin: Skin is warm  and dry. Capillary refill takes less than 2 seconds.  Psychiatric: His mood appears anxious. He is agitated. He expresses suicidal ideation. He expresses suicidal plans.  Nursing note and vitals reviewed.    ED Treatments / Results  Labs (all labs ordered are listed, but only abnormal results are displayed) Labs Reviewed  RAPID URINE DRUG SCREEN, HOSP PERFORMED - Abnormal; Notable for the following components:      Result Value   Amphetamines POSITIVE (*)    Tetrahydrocannabinol POSITIVE (*)    All other components within normal limits  COMPREHENSIVE METABOLIC PANEL  ETHANOL  SALICYLATE LEVEL  ACETAMINOPHEN LEVEL  CBC    EKG None  Radiology No results  found.  Procedures Procedures (including critical care time)  Medications Ordered in ED Medications - No data to display   Initial Impression / Assessment and Plan / ED Course  I have reviewed the triage vital signs and the nursing notes.  Pertinent labs & imaging results that were available during my care of the patient were reviewed by me and considered in my medical decision making (see chart for details).     27 year old male history of polysubstance abuse presents today complaining of depression secondary to substance abuse and suicidal ideation. Patient was seen earlier today at that time was not suicidal or homicidal.  At that time he was discharged with information regarding outpatient follow-up.  Today he is expressing intent to harm himself.  Plan behavioral health evaluation.  Final Clinical Impressions(s) / ED Diagnoses   Final diagnoses:  Suicidal ideation  Polysubstance abuse [facility])    ED Discharge Orders    None       Margarita Grizzle, MD 01/02/18 1436

## 2018-01-02 NOTE — Discharge Instructions (Addendum)
Look at the information about getting help for your addiction.

## 2018-01-02 NOTE — ED Notes (Signed)
Patient's belongings secured in locker.  Patient placed in paper scrubs.

## 2018-01-02 NOTE — ED Notes (Signed)
Physician in room with patient.
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
## 2018-01-02 NOTE — ED Triage Notes (Signed)
Patient states he is homeless and "I have a lot going on and I feel like if I have to keep going through this I would rather just end it all." States he was treated here yesterday for same "but they wouldn't help me. I need to go to behavioral health or something." States he last used meth and marijuana 2 days ago.

## 2018-01-02 NOTE — ED Notes (Signed)
Pts mother brought in a snack for the Pt. RN verified no harmful objects present and allowed Pt to have it.

## 2018-01-02 NOTE — ED Notes (Signed)
Pt asleep and did not answer phone call made to [facility]. Msg is to have Pt call Onalee HuaDavid back at [phone] when Pt awakes regarding Pt's status in reference to Pt placement

## 2018-01-02 NOTE — ED Provider Notes (Signed)
[facility] EMERGENCY DEPARTMENT Provider Note   CSN: [id number] Arrival date & time: 01/01/18  2255  Time seen 02:00 AM   History   Chief Complaint Chief Complaint  Patient presents with  . V70.1    HPI Alan Hess is a 27 y.o. male.  HPI patient states he is here for "help for drug addiction".  He states the last time he went to detox and rehab was about 6 months ago.  He cannot tell me where he went.  He states his drug of choice is ice.  Patient states he is depressed but he denies suicidal or homicidal ideation.  He denies any alcohol abuse.  Patient is very hard to understand he is speaking softly and mumbling.  PCP Patient, No Pcp Per   Past Medical History:  Diagnosis Date  . ADD (attention deficit disorder)   . Anxiety   . Depression   . Seasonal allergies     Patient Active Problem List   Diagnosis Date Noted  . Opiate misuse   . Acute stress disorder 12/07/2014  . 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 Attention deficit hyperactivity disorder (ADHD), combined type   . ADHD (attention deficit hyperactivity disorder) 10/07/2014  . PTSD (post-traumatic stress disorder) 10/07/2014  . Substance abuse (HCC) 10/07/2014  . Seasonal allergies   . ADD (attention deficit disorder)     History reviewed. No pertinent surgical history.      Home Medications    Prior to Admission medications   Medication Sig Start Date End Date Taking? Authorizing Provider  ciprofloxacin (CIPRO) 500 MG tablet Take 1 tablet (500 mg total) by mouth 2 (two) times daily. 12/08/16   Lavera Guise, MD  HYDROcodone-acetaminophen (NORCO/VICODIN) 5-325 MG tablet Take 1-2 tablets by mouth every 6 (six) hours as needed for moderate pain or severe pain. 12/08/16   Lavera Guise, MD  metroNIDAZOLE (FLAGYL) 500 MG tablet Take 1 tablet (500 mg total) by mouth 2 (two) times daily. 12/08/16   Lavera Guise, MD  ondansetron (ZOFRAN) 4 MG tablet Take 1 tablet (4 mg total) by mouth every 8  (eight) hours as needed for nausea or vomiting. 12/08/16   Lavera Guise, MD    Family History Family History  Problem Relation Age of Onset  . Cancer Other   . Diabetes Other     Social History Social History   Tobacco Use  . Smoking status: Never Smoker  . Smokeless tobacco: Current User    Types: Chew, Snuff  Substance Use Topics  . Alcohol use: Yes  . 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    Physical Exam Updated Vital Signs BP 131/76 (BP Location: Right Arm)   Pulse 87   Temp 97.8 F (36.6 C) (Oral)   Resp 18   Ht 5\' 10"  (1.778 m)   Wt 59 kg (130 lb)   SpO2 100%   BMI 18.65 kg/m   Vital signs normal    Physical Exam  Constitutional: He is oriented to person, place, and time.  Non-toxic appearance. He does not appear ill. No distress.  Thin male, keeps falling asleep  HENT:  Head: Normocephalic and atraumatic.  Right Ear: External ear normal.  Left Ear: External ear  normal.  Nose: Nose normal. No mucosal edema or rhinorrhea.  Mouth/Throat: Oropharynx is clear and moist and mucous membranes are normal. No dental abscesses or uvula swelling.  Eyes: Pupils are equal, round, and reactive to light. Conjunctivae and EOM are normal.  Neck: Normal range of motion and full passive range of motion without pain. Neck supple.  Cardiovascular: Normal rate, regular rhythm and normal heart sounds. Exam reveals no gallop and no friction rub.  No murmur heard. Pulmonary/Chest: Effort normal and breath sounds normal. No respiratory distress. He has no wheezes. He has no rhonchi. He has no rales. He exhibits no tenderness and no crepitus.  Abdominal: Soft. Normal appearance and bowel sounds are normal. He exhibits no distension. There is no tenderness. There is no rebound and no guarding.    Musculoskeletal: Normal range of motion. He exhibits no edema or tenderness.  Moves all extremities well.   Neurological: He is alert and oriented to person, place, and time. He has normal strength. No cranial nerve deficit.  Skin: Skin is warm, dry and intact. No rash noted. No erythema. No pallor.  Psychiatric:  Patient speaks softly and mumbles.  He gets upset when I asked him to repeat what he says.  The sitter who is watching him also cannot understand what he saying.  Nursing note and vitals reviewed.    ED Treatments / Results  Labs (all labs ordered are listed, but only abnormal results are displayed) Results for orders placed or performed during the hospital encounter of 01/01/18  Comprehensive metabolic panel  Result Value Ref Range   Sodium 138 135 - 145 mmol/L   Potassium 3.6 3.5 - 5.1 mmol/L   Chloride 105 98 - 111 mmol/L   CO2 27 22 - 32 mmol/L   Glucose, Bld 122 (H) 70 - 99 mg/dL   BUN 12 6 - 20 mg/dL   Creatinine, Ser 2.13 0.61 - 1.24 mg/dL   Calcium 8.4 (L) 8.9 - 10.3 mg/dL   Total Protein 5.8 (L) 6.5 - 8.1 g/dL   Albumin 3.6 3.5 - 5.0 g/dL   AST 16 15 - 41 U/L   ALT 14 0 - 44 U/L   Alkaline Phosphatase 48 38 - 126 U/L   Total Bilirubin 2.1 (H) 0.3 - 1.2 mg/dL   GFR calc non Af Amer >60 >60 mL/min   GFR calc Af Amer >60 >60 mL/min   Anion gap 6 5 - 15  Ethanol  Result Value Ref Range   Alcohol, Ethyl (B) <10 <10 mg/dL  cbc  Result Value Ref Range   WBC 11.0 (H) 4.0 - 10.5 K/uL   RBC 4.35 4.22 - 5.81 MIL/uL   Hemoglobin 13.6 13.0 - 17.0 g/dL   HCT 08.6 57.8 - 46.9 %   MCV 92.0 78.0 - 100.0 fL   MCH 31.3 26.0 - 34.0 pg   MCHC 34.0 30.0 - 36.0 g/dL   RDW 62.9 52.8 - 41.3 %   Platelets 350 150 - 400 K/uL  Rapid urine drug screen (hospital performed)  Result Value Ref Range   Opiates NONE DETECTED NONE DETECTED   Cocaine NONE DETECTED NONE DETECTED   Benzodiazepines NONE DETECTED NONE DETECTED   Amphetamines POSITIVE (A) NONE DETECTED    Tetrahydrocannabinol NONE DETECTED NONE DETECTED   Barbiturates NONE DETECTED NONE DETECTED   Laboratory interpretation all normal except +UDS    EKG None  Radiology No results found.  Procedures Procedures (including critical care time)  Medications Ordered in ED Medications -  No data to display   Initial Impression / Assessment and Plan / ED Course  I have reviewed the triage vital signs and the nursing notes.  Pertinent labs & imaging results that were available during my care of the patient were reviewed by me and considered in my medical decision making (see chart for details).    I have reviewed patient's laboratory testing, there is no evidence of any other substance abuse other than the methamphetamine that he came in for.  Patient states he is depressed however he denies suicidal or homicidal ideation.  At this point I explained to him he does not meet criteria for inpatient admission.  He is agreeable to going home and following up as an outpatient.   Final Clinical Impressions(s) / ED Diagnoses   Final diagnoses:  Methamphetamine abuse [facility](HCC)    ED Discharge Orders    None      Plan discharge  Devoria AlbeIva Nishant Schrecengost, MD, Concha PyoFACEP    Durrel Mcnee, MD 01/02/18 [phone]

## 2018-01-03 MED ORDER — LORAZEPAM 1 MG PO TABS
2.0000 mg | ORAL_TABLET | Freq: Once | ORAL | Status: AC
  Administered 2018-01-03: 2 mg via ORAL
  Filled 2018-01-03: qty 2

## 2018-01-03 NOTE — ED Notes (Signed)
Pt has been threatening to leave, being loud, & asking can he walk around. Stated, "If I gotta stay here, I am going to kill myself!" Also stated, "I wanna kill something!"

## 2018-01-03 NOTE — ED Notes (Signed)
Pt given breakfast tray

## 2018-01-03 NOTE — ED Notes (Signed)
Family at bedside. 

## 2018-01-03 NOTE — Progress Notes (Addendum)
Patient is seen by me via tele-psych today and I have consulted with Dr. Jama Flavorsobos.  Patient denies any current SI/HI/AVH and contracts for safety.  Patient states that he will be returning home with his mom and his sister to live and that they are some very supportive.  He states that his plans are to go to rehabilitation and to stop using drugs.  Patient states he would just like to leave soon because he is tired of being in the hospital.  He does request a ride to his mom's house and then he request for someone to call his mom to come and pick him up if he is going to be discharged.  Patient does not meet inpatient criteria and is psychiatrically cleared.  I have contacted Dr. Juleen ChinaKohut and notified him of our recommendations.  Marland Kitchen..Agree with NP Progress Note

## 2018-01-03 NOTE — ED Notes (Signed)
Pt very agitated and stating "I need something to calm me down and I need to get out of here"  MD notified and now medicated.

## 2018-01-03 NOTE — ED Notes (Signed)
Pt is started to get agitated.  Pt talked on the phone with his cousin telling him to come get him.  This RN explained to the pt that he is under IVC paperwork and he cannot leave. Pt states "get me a room or Im leaving because I'm tripped the fuck out"

## 2018-01-03 NOTE — ED Notes (Signed)
Rescinded IVC papers faxed to Magistrate. 

## 2018-06-05 ENCOUNTER — Emergency Department (HOSPITAL_COMMUNITY): Payer: Medicaid Other

## 2018-06-05 ENCOUNTER — Inpatient Hospital Stay (HOSPITAL_COMMUNITY)
Admission: EM | Admit: 2018-06-05 | Discharge: 2018-07-05 | DRG: 917 | Disposition: E | Payer: Medicaid Other | Attending: Pulmonary Disease | Admitting: Pulmonary Disease

## 2018-06-05 ENCOUNTER — Encounter (HOSPITAL_COMMUNITY): Payer: Self-pay

## 2018-06-05 ENCOUNTER — Inpatient Hospital Stay (HOSPITAL_COMMUNITY): Payer: Medicaid Other

## 2018-06-05 DIAGNOSIS — Z529 Donor of unspecified organ or tissue: Secondary | ICD-10-CM

## 2018-06-05 DIAGNOSIS — J80 Acute respiratory distress syndrome: Secondary | ICD-10-CM | POA: Diagnosis present

## 2018-06-05 DIAGNOSIS — G931 Anoxic brain damage, not elsewhere classified: Secondary | ICD-10-CM | POA: Diagnosis present

## 2018-06-05 DIAGNOSIS — E872 Acidosis, unspecified: Secondary | ICD-10-CM | POA: Insufficient documentation

## 2018-06-05 DIAGNOSIS — G936 Cerebral edema: Secondary | ICD-10-CM | POA: Diagnosis present

## 2018-06-05 DIAGNOSIS — G9382 Brain death: Secondary | ICD-10-CM | POA: Diagnosis present

## 2018-06-05 DIAGNOSIS — F17220 Nicotine dependence, chewing tobacco, uncomplicated: Secondary | ICD-10-CM | POA: Diagnosis present

## 2018-06-05 DIAGNOSIS — E874 Mixed disorder of acid-base balance: Secondary | ICD-10-CM | POA: Diagnosis present

## 2018-06-05 DIAGNOSIS — T407X1A Poisoning by cannabis (derivatives), accidental (unintentional), initial encounter: Secondary | ICD-10-CM | POA: Diagnosis present

## 2018-06-05 DIAGNOSIS — T43621A Poisoning by amphetamines, accidental (unintentional), initial encounter: Secondary | ICD-10-CM | POA: Diagnosis present

## 2018-06-05 DIAGNOSIS — T401X1A Poisoning by heroin, accidental (unintentional), initial encounter: Secondary | ICD-10-CM | POA: Diagnosis present

## 2018-06-05 DIAGNOSIS — T405X1A Poisoning by cocaine, accidental (unintentional), initial encounter: Secondary | ICD-10-CM | POA: Diagnosis present

## 2018-06-05 DIAGNOSIS — Z833 Family history of diabetes mellitus: Secondary | ICD-10-CM | POA: Diagnosis not present

## 2018-06-05 DIAGNOSIS — L602 Onychogryphosis: Secondary | ICD-10-CM

## 2018-06-05 DIAGNOSIS — R7401 Elevation of levels of liver transaminase levels: Secondary | ICD-10-CM

## 2018-06-05 DIAGNOSIS — R0902 Hypoxemia: Secondary | ICD-10-CM

## 2018-06-05 DIAGNOSIS — R740 Nonspecific elevation of levels of transaminase and lactic acid dehydrogenase [LDH]: Secondary | ICD-10-CM

## 2018-06-05 DIAGNOSIS — I248 Other forms of acute ischemic heart disease: Secondary | ICD-10-CM | POA: Diagnosis present

## 2018-06-05 DIAGNOSIS — R4189 Other symptoms and signs involving cognitive functions and awareness: Secondary | ICD-10-CM

## 2018-06-05 DIAGNOSIS — I468 Cardiac arrest due to other underlying condition: Secondary | ICD-10-CM | POA: Diagnosis present

## 2018-06-05 DIAGNOSIS — Z809 Family history of malignant neoplasm, unspecified: Secondary | ICD-10-CM | POA: Diagnosis not present

## 2018-06-05 DIAGNOSIS — R6521 Severe sepsis with septic shock: Secondary | ICD-10-CM | POA: Diagnosis present

## 2018-06-05 DIAGNOSIS — F419 Anxiety disorder, unspecified: Secondary | ICD-10-CM | POA: Diagnosis present

## 2018-06-05 DIAGNOSIS — D72820 Lymphocytosis (symptomatic): Secondary | ICD-10-CM

## 2018-06-05 DIAGNOSIS — T424X1A Poisoning by benzodiazepines, accidental (unintentional), initial encounter: Secondary | ICD-10-CM | POA: Diagnosis present

## 2018-06-05 DIAGNOSIS — N179 Acute kidney failure, unspecified: Secondary | ICD-10-CM | POA: Diagnosis present

## 2018-06-05 DIAGNOSIS — F329 Major depressive disorder, single episode, unspecified: Secondary | ICD-10-CM | POA: Diagnosis present

## 2018-06-05 DIAGNOSIS — J9601 Acute respiratory failure with hypoxia: Secondary | ICD-10-CM | POA: Diagnosis present

## 2018-06-05 DIAGNOSIS — R7989 Other specified abnormal findings of blood chemistry: Secondary | ICD-10-CM

## 2018-06-05 DIAGNOSIS — A419 Sepsis, unspecified organism: Secondary | ICD-10-CM | POA: Diagnosis present

## 2018-06-05 DIAGNOSIS — I469 Cardiac arrest, cause unspecified: Secondary | ICD-10-CM | POA: Diagnosis present

## 2018-06-05 DIAGNOSIS — Z452 Encounter for adjustment and management of vascular access device: Secondary | ICD-10-CM

## 2018-06-05 DIAGNOSIS — K7201 Acute and subacute hepatic failure with coma: Secondary | ICD-10-CM | POA: Diagnosis present

## 2018-06-05 DIAGNOSIS — R000 Tachycardia, unspecified: Secondary | ICD-10-CM

## 2018-06-05 DIAGNOSIS — Z978 Presence of other specified devices: Secondary | ICD-10-CM

## 2018-06-05 DIAGNOSIS — F1910 Other psychoactive substance abuse, uncomplicated: Secondary | ICD-10-CM

## 2018-06-05 DIAGNOSIS — Z4659 Encounter for fitting and adjustment of other gastrointestinal appliance and device: Secondary | ICD-10-CM

## 2018-06-05 DIAGNOSIS — R778 Other specified abnormalities of plasma proteins: Secondary | ICD-10-CM

## 2018-06-05 LAB — COMPREHENSIVE METABOLIC PANEL
ALBUMIN: 3.3 g/dL — AB (ref 3.5–5.0)
ALK PHOS: 93 U/L (ref 38–126)
ALT: 1101 U/L — ABNORMAL HIGH (ref 0–44)
ANION GAP: 21 — AB (ref 5–15)
AST: 1035 U/L — ABNORMAL HIGH (ref 15–41)
BUN: 11 mg/dL (ref 6–20)
CALCIUM: 8.4 mg/dL — AB (ref 8.9–10.3)
CO2: 15 mmol/L — AB (ref 22–32)
Chloride: 105 mmol/L (ref 98–111)
Creatinine, Ser: 2.32 mg/dL — ABNORMAL HIGH (ref 0.61–1.24)
GFR calc non Af Amer: 38 mL/min — ABNORMAL LOW (ref >60)
GFR, EST AFRICAN AMERICAN: 44 mL/min — AB (ref >60)
GLUCOSE: 302 mg/dL — AB (ref 70–99)
Potassium: 5.1 mmol/L (ref 3.5–5.1)
SODIUM: 141 mmol/L (ref 135–145)
Total Bilirubin: 1.2 mg/dL (ref 0.3–1.2)
Total Protein: 5.8 g/dL — ABNORMAL LOW (ref 6.5–8.1)

## 2018-06-05 LAB — I-STAT ARTERIAL BLOOD GAS, ED
ACID-BASE DEFICIT: 13 mmol/L — AB (ref 0.0–2.0)
Acid-base deficit: 14 mmol/L — ABNORMAL HIGH (ref 0.0–2.0)
BICARBONATE: 15.9 mmol/L — AB (ref 20.0–28.0)
Bicarbonate: 13.8 mmol/L — ABNORMAL LOW (ref 20.0–28.0)
O2 Saturation: 99 %
O2 Saturation: 88 %
PH ART: 7.193 — AB (ref 7.350–7.450)
PO2 ART: 196 mmHg — AB (ref 83.0–108.0)
Patient temperature: 96
Patient temperature: 93.2
TCO2: 17 mmol/L — ABNORMAL LOW (ref 22–32)
TCO2: 15 mmol/L — ABNORMAL LOW (ref 22–32)
pCO2 arterial: 43.3 mmHg (ref 32.0–48.0)
pCO2 arterial: 34.4 mmHg (ref 32.0–48.0)
pH, Arterial: 7.164 — CL (ref 7.350–7.450)
pO2, Arterial: 57 mmHg — ABNORMAL LOW (ref 83.0–108.0)

## 2018-06-05 LAB — URINALYSIS, ROUTINE W REFLEX MICROSCOPIC
Bilirubin Urine: NEGATIVE
Glucose, UA: >=500 mg/dL — AB
Ketones, ur: NEGATIVE mg/dL
Leukocytes, UA: NEGATIVE
Nitrite: NEGATIVE
Protein, ur: 100 mg/dL — AB
Specific Gravity, Urine: 1.006 (ref 1.005–1.030)
pH: 7 (ref 5.0–8.0)

## 2018-06-05 LAB — I-STAT CG4 LACTIC ACID, ED: Lactic Acid, Venous: 11.28 mmol/L (ref 0.5–1.9)

## 2018-06-05 LAB — CBC WITH DIFFERENTIAL/PLATELET
ABS IMMATURE GRANULOCYTES: 0.4 10*3/uL — AB (ref 0.00–0.07)
BASOS ABS: 0 10*3/uL (ref 0.0–0.1)
Basophils Relative: 0 %
EOS PCT: 2 %
Eosinophils Absolute: 0.4 10*3/uL (ref 0.0–0.5)
HCT: 47.2 % (ref 39.0–52.0)
HEMOGLOBIN: 14.7 g/dL (ref 13.0–17.0)
LYMPHS ABS: 1.6 10*3/uL (ref 0.7–4.0)
LYMPHS PCT: 9 %
MCH: 31.1 pg (ref 26.0–34.0)
MCHC: 31.1 g/dL (ref 30.0–36.0)
MCV: 99.8 fL (ref 80.0–100.0)
Metamyelocytes Relative: 2 %
Monocytes Absolute: 1.1 10*3/uL — ABNORMAL HIGH (ref 0.1–1.0)
Monocytes Relative: 6 %
NEUTROS ABS: 14.3 10*3/uL — AB (ref 1.7–7.7)
NRBC: 0 % (ref 0.0–0.2)
NRBC: 0 /100{WBCs}
Neutrophils Relative %: 81 %
PLATELETS: 518 10*3/uL — AB (ref 150–400)
RBC: 4.73 MIL/uL (ref 4.22–5.81)
RDW: 12.4 % (ref 11.5–15.5)
WBC: 17.7 10*3/uL — AB (ref 4.0–10.5)

## 2018-06-05 LAB — RAPID URINE DRUG SCREEN, HOSP PERFORMED
AMPHETAMINES: POSITIVE — AB
Barbiturates: NOT DETECTED
Benzodiazepines: POSITIVE — AB
Cocaine: POSITIVE — AB
OPIATES: POSITIVE — AB
TETRAHYDROCANNABINOL: POSITIVE — AB

## 2018-06-05 LAB — SALICYLATE LEVEL

## 2018-06-05 LAB — I-STAT TROPONIN, ED
TROPONIN I, POC: 0.21 ng/mL — AB (ref 0.00–0.08)
Troponin i, poc: 0.74 ng/mL (ref 0.00–0.08)

## 2018-06-05 LAB — I-STAT CHEM 8, ED
BUN: 14 mg/dL (ref 6–20)
CALCIUM ION: 1.08 mmol/L — AB (ref 1.15–1.40)
CHLORIDE: 104 mmol/L (ref 98–111)
Creatinine, Ser: 2 mg/dL — ABNORMAL HIGH (ref 0.61–1.24)
GLUCOSE: 295 mg/dL — AB (ref 70–99)
HCT: 45 % (ref 39.0–52.0)
Hemoglobin: 15.3 g/dL (ref 13.0–17.0)
POTASSIUM: 4.9 mmol/L (ref 3.5–5.1)
SODIUM: 138 mmol/L (ref 135–145)
TCO2: 20 mmol/L — ABNORMAL LOW (ref 22–32)

## 2018-06-05 LAB — POCT I-STAT 3, ART BLOOD GAS (G3+)
Acid-base deficit: 6 mmol/L — ABNORMAL HIGH (ref 0.0–2.0)
Bicarbonate: 22.4 mmol/L (ref 20.0–28.0)
O2 Saturation: 82 %
TCO2: 24 mmol/L (ref 22–32)
pCO2 arterial: 56 mmHg — ABNORMAL HIGH (ref 32.0–48.0)
pH, Arterial: 7.212 — ABNORMAL LOW (ref 7.350–7.450)
pO2, Arterial: 58 mmHg — ABNORMAL LOW (ref 83.0–108.0)

## 2018-06-05 LAB — MRSA PCR SCREENING: MRSA BY PCR: NEGATIVE

## 2018-06-05 LAB — ACETAMINOPHEN LEVEL

## 2018-06-05 LAB — ETHANOL

## 2018-06-05 LAB — TSH: TSH: 1.442 u[IU]/mL (ref 0.350–4.500)

## 2018-06-05 LAB — CBG MONITORING, ED: GLUCOSE-CAPILLARY: 267 mg/dL — AB (ref 70–99)

## 2018-06-05 MED ORDER — SODIUM CHLORIDE 0.9 % IV SOLN
1.5000 g | Freq: Four times a day (QID) | INTRAVENOUS | Status: DC
  Administered 2018-06-05 – 2018-06-06 (×3): 1.5 g via INTRAVENOUS
  Filled 2018-06-05 (×4): qty 1.5

## 2018-06-05 MED ORDER — NOREPINEPHRINE 16 MG/250ML-% IV SOLN
0.0000 ug/min | INTRAVENOUS | Status: DC
  Filled 2018-06-05: qty 250

## 2018-06-05 MED ORDER — MIDAZOLAM HCL 2 MG/2ML IJ SOLN: 2.0000 mg | INTRAMUSCULAR | Status: DC | PRN

## 2018-06-05 MED ORDER — LACTATED RINGERS IV BOLUS
1000.0000 mL | Freq: Once | INTRAVENOUS | Status: AC
  Administered 2018-06-05: 1000 mL via INTRAVENOUS

## 2018-06-05 MED ORDER — NOREPINEPHRINE BITARTRATE 1 MG/ML IV SOLN
0.0000 ug/min | INTRAVENOUS | Status: DC
  Administered 2018-06-05: 40 ug/min via INTRAVENOUS
  Administered 2018-06-06: 10 ug/min via INTRAVENOUS
  Filled 2018-06-05: qty 4

## 2018-06-05 MED ORDER — CALCIUM GLUCONATE-NACL 1-0.675 GM/50ML-% IV SOLN
1.0000 g | Freq: Once | INTRAVENOUS | Status: AC
  Administered 2018-06-05: 1000 mg via INTRAVENOUS
  Filled 2018-06-05: qty 50

## 2018-06-05 MED ORDER — SODIUM BICARBONATE 8.4 % IV SOLN
INTRAVENOUS | Status: AC
  Administered 2018-06-05: 50 meq
  Filled 2018-06-05: qty 50

## 2018-06-05 MED ORDER — FENTANYL CITRATE (PF) 100 MCG/2ML IJ SOLN: 100.0000 ug | INTRAMUSCULAR | Status: DC | PRN

## 2018-06-05 MED ORDER — HEPARIN SODIUM (PORCINE) 5000 UNIT/ML IJ SOLN
5000.0000 [IU] | Freq: Three times a day (TID) | INTRAMUSCULAR | Status: DC
  Administered 2018-06-05 – 2018-06-08 (×9): 5000 [IU] via SUBCUTANEOUS
  Filled 2018-06-05 (×9): qty 1

## 2018-06-05 MED ORDER — SODIUM CHLORIDE 0.9 % IV BOLUS
1000.0000 mL | Freq: Once | INTRAVENOUS | Status: AC
  Administered 2018-06-06: 1000 mL via INTRAVENOUS

## 2018-06-05 MED ORDER — SODIUM CHLORIDE 0.9 % IV SOLN
250.0000 mL | INTRAVENOUS | Status: DC
  Administered 2018-06-05 – 2018-06-06 (×2): 250 mL via INTRAVENOUS

## 2018-06-05 MED ORDER — STERILE WATER FOR INJECTION IV SOLN
INTRAVENOUS | Status: DC
  Administered 2018-06-05 – 2018-06-06 (×2): via INTRAVENOUS
  Filled 2018-06-05 (×2): qty 850

## 2018-06-05 NOTE — Progress Notes (Signed)
Pt's ABG results given to Dr. Criss Alvine 7.16 Co2 43.3 P02 196 Hc03 15.9 @ 18:40

## 2018-06-05 NOTE — Progress Notes (Signed)
eLink Physician-Brief Progress Note Patient Name: Alan Hess DOB: 1990/08/18 MRN: [id number]   Date of Service  06/19/2018  HPI/Events of Note    eICU Interventions  Camera: asking for Levophed order. MAP 45. Has 3 peripherals only. In synchrony  With vent. Hr 124.   Plan: - Levophed drip via peripheral line/pharmacy consult.  Need a central line if not better or requiring medium dose levophed. Avoid extravasation. Get ABG stat. On bicarb drip at 75 ml/hr.      Intervention Category Major Interventions: Hypotension - evaluation and management Minor Interventions: Communication with other healthcare providers and/or family  Ranee Gosselin 06/15/2018, 10:25 PM

## 2018-06-05 NOTE — H&P (Addendum)
PULMONARY / CRITICAL CARE MEDICINE   NAME:  Alan Hess, MRN:  [id number], DOB:  [birth year], LOS: 0 ADMISSION DATE:  06/14/2018, CONSULTATION DATE: 06/19/2018 REFERRING MD: Emergency room physician, CHIEF COMPLAINT: Cardiac arrest  BRIEF HISTORY:    28 year old male with history of polysubstance abuse coming in post cardiac arrest.   HISTORY OF PRESENT ILLNESS   Mr. Talk is a 28 year old male with history of ADD and anxiety polysubstance abuse and depression presented to the emergency room after cardiac arrest.  Patient was found cyanotic by his family members for unknown time when EMS arrived patient was in PEA started CPR for around 16-minute had one round of epinephrine and Narcan finally had a return of circulation.  Patient was intubated and brought into the emergency room.  We were called to assess the patient I cannot assess the patient in the emergency room patient was not in any sedation he is obtunded CT scan of the brain showing some brain edema consistent with anoxic brain injury.  There was no family around to get any history and most of the history was taken from the staff and from the chart.  Patient has positive drug screen for amphetamines benzos opiates cocaine and THC.   SIGNIFICANT PAST MEDICAL HISTORY   -Anxiety -Polysubstance abuse -ADD  SIGNIFICANT EVENTS:   STUDIES:   CT head 06/13/2018 Findings consistent with mild diffuse cerebral edema with diffuse sulcal effacement. Questionable loss of gray-white differentiation in the occipital lobes versus motion artifact. CULTURES:   ANTIBIOTICS:  None  LINES/TUBES:   ET tube 06/21/2018 CONSULTANTS:   SUBJECTIVE:    CONSTITUTIONAL: BP (!) 92/48   Pulse (!) 139   Temp (!) 96.1 F (35.6 C)   Resp (!) 28   Ht 5\' 10"  (1.778 m)   SpO2 90%   BMI 18.65 kg/m   No intake/output data recorded.     Vent Mode: PRVC FiO2 (%):  [50 %-100 %] 70 % Set Rate:  [24 bmp] 24 bmp Vt Set:  [580 mL] 580 mL PEEP:  [5  cmH20] 5 cmH20 Plateau Pressure:  [24 cmH20-26 cmH20] 24 cmH20  PHYSICAL EXAM: General: Acutely ill intubated obtunded Neuro: Pupils 3 mm sluggish with upward gaze not responding to pain not moving any of his extremities HEENT: Endotracheal tube in position Cardiovascular: Normal heart sound no added sounds or murmurs Lungs: Clear equal air sounds bilateral no crackles no wheezing Abdomen: Soft no tenderness no organomegaly Musculoskeletal: No lower limb edema Skin: No rash  RESOLVED PROBLEM LIST   ASSESSMENT AND PLAN   Assessment: -Post cardiac arrest -Acute hypoxemic respiratory failure require mechanical ventilation -Acute kidney injury -Severe metabolic acidosis/lactic acidosis -Anoxic brain injury -Polysubstance abuse   Plan: -Admit to the intensive care unit for further management -We will hold off on starting hypothermia protocol due to patient having unwitnessed cardiac arrest unknown downtime and already having signs of anoxic brain injury on his CT scan -Frequent neurological checks -IV fluid boluses -Adjust mechanical ventilation use lung protective strategy keep pulse ox above 94% -Sodium bicarb drip -EEG -Frequent neurological checks -Consult neurology in the morning for prognostication -Discussed plan of care with family when they arrive -Heparin subcu for DVT prophylaxis -PPI for GI prophylaxis  I have spent 40 minutes of critical care time this time was spent bedside or in the unit this time was exclusive any billable procedures patient is needing intensive care due to acute respiratory failure requiring mechanical ventilation post cardiac arrest  SUMMARY OF TODAY'S PLAN:  Best Practice / Goals of Care / Disposition.   DVT PROPHYLAXIS: SCD and heparin subcu SUP: PPI NUTRITION: N.p.o. MOBILITY: Bedrest GOALS OF CARE: Full code FAMILY DISCUSSIONS: No family at bedside DISPOSITION ICU admission  LABS  Glucose Recent Labs  Lab 06-12-18 1756   GLUCAP 267*    BMET Recent Labs  Lab 06/12/18 1759 12-Jun-2018 1807  NA 141 138  K 5.1 4.9  CL 105 104  CO2 15*  --   BUN 11 14  CREATININE 2.32* 2.00*  GLUCOSE 302* 295*    Liver Enzymes Recent Labs  Lab 06/12/18 1759  AST 1,035*  ALT 1,101*  ALKPHOS 93  BILITOT 1.2  ALBUMIN 3.3*    Electrolytes Recent Labs  Lab 12-Jun-2018 1759  CALCIUM 8.4*    CBC Recent Labs  Lab 06-12-2018 1759 June 12, 2018 1807  WBC 17.7*  --   HGB 14.7 15.3  HCT 47.2 45.0  PLT 518*  --     ABG Recent Labs  Lab 12-Jun-2018 1836 06-12-18 1954  PHART 7.164* 7.193*  PCO2ART 43.3 34.4  PO2ART 196.0* 57.0*    Coag's No results for input(s): APTT, INR in the last 168 hours.  Sepsis Markers No results for input(s): LATICACIDVEN, PROCALCITON, O2SATVEN in the last 168 hours.  Cardiac Enzymes No results for input(s): TROPONINI, PROBNP in the last 168 hours.  PAST MEDICAL HISTORY :   He  has a past medical history of ADD (attention deficit disorder), Anxiety, Depression, and Seasonal allergies.  PAST SURGICAL HISTORY:  He  has no past surgical history on file.  Allergies  Allergen Reactions  . Iohexol Hives and Itching     Code: HIVES, Desc: became itchy after injection, Onset Date: [id number]     No current facility-administered medications on file prior to encounter.    Current Outpatient Medications on File Prior to Encounter  Medication Sig  . ciprofloxacin (CIPRO) 500 MG tablet Take 1 tablet (500 mg total) by mouth 2 (two) times daily. (Patient not taking: Reported on 01/02/2018)  . HYDROcodone-acetaminophen (NORCO/VICODIN) 5-325 MG tablet Take 1-2 tablets by mouth every 6 (six) hours as needed for moderate pain or severe pain. (Patient not taking: Reported on 01/02/2018)  . metroNIDAZOLE (FLAGYL) 500 MG tablet Take 1 tablet (500 mg total) by mouth 2 (two) times daily. (Patient not taking: Reported on 01/02/2018)  . ondansetron (ZOFRAN) 4 MG tablet Take 1 tablet (4 mg total) by mouth  every 8 (eight) hours as needed for nausea or vomiting. (Patient not taking: Reported on 01/02/2018)    FAMILY HISTORY:   His family history includes Cancer in an other family member; Diabetes in an other family member. Could not be obtained due to patient altered mental status SOCIAL HISTORY:  He  reports that he has never smoked. His smokeless tobacco use includes chew and snuff. He reports current alcohol use. He reports current drug use. Drug: Cocaine. Could not be obtained due to patient altered mental status and this information was from his chart REVIEW OF SYSTEMS:    Could not be obtained due to patient altered mental status

## 2018-06-05 NOTE — ED Notes (Signed)
ICU provider at bedside

## 2018-06-05 NOTE — Progress Notes (Signed)
Sputum culture collected, sent to lab.  

## 2018-06-05 NOTE — Progress Notes (Signed)
eLink Physician-Brief Progress Note Patient Name: Alan Hess DOB: Jan 29, 1991 MRN: [id number]   Date of Service  06/20/2018  HPI/Events of Note  Bed side RN called. On Levo at 40, MAP improving, but still < 65. Family at bed side.  Ground team has not seen yet. ABG improving Metabolic acidosis.   eICU Interventions  1. notified Charlcie Cradle, NP to see him first. For central line, discussion with family about prognosis. 2. Has ARDS mostly from aspiration PNA, multilobar. Or non cardiogenic edema.  P/F ratio 58.  Increased PEEP to 10. On 80ml/ibw Vt now.      Intervention Category Major Interventions: Acid-Base disturbance - evaluation and management Minor Interventions: Communication with other healthcare providers and/or family  Ranee Gosselin 06/24/2018, 11:07 PM

## 2018-06-05 NOTE — ED Triage Notes (Signed)
Pt was last seen normal yesterday. Pt was sleeping in bed all day. CPR was started at 1651 lasted 10 mins, 1 epi given 4mg  of Narcan and ROSC at 1707.

## 2018-06-05 NOTE — Progress Notes (Signed)
CDS referral called in to [facility]. Reference #: [id number].

## 2018-06-05 NOTE — ED Notes (Signed)
Paged critical to [facility] @ [phone]

## 2018-06-05 NOTE — ED Provider Notes (Signed)
[facility] EMERGENCY DEPARTMENT Provider Note   CSN: [id number] Arrival date & time: 06/24/2018  1747     History   Chief Complaint Chief Complaint  Patient presents with  . Cardiac Arrest    HPI Alan Hess is a 28 y.o. male.  HPI  Patient is a 28 year old male with a past medical history of ADD, anxiety, depression, and unknown drug abuse who presents via EMS for further evaluation management after cardiac arrest.  Patient was reportedly found cyanotic by family members.  On arrival by EMS patient was noted to be cyanotic and CPR was initiated.  Initial rhythm was asystolic.  CPR was performed for approximately 16 minutes after which Roscoe was obtained.  Throughout this course of 60 minutes of CPR the patient received 1 mg of epinephrine and 4 mg of Narcan.  In addition the patient was intubated.  Past Medical History:  Diagnosis Date  . ADD (attention deficit disorder)   . Anxiety   . Depression   . Seasonal allergies     Patient Active Problem List   Diagnosis Date Noted  . Opiate misuse   . Acute stress disorder 12/07/2014  . 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 Attention deficit hyperactivity disorder (ADHD), combined type   . ADHD (attention deficit hyperactivity disorder) 10/07/2014  . PTSD (post-traumatic stress disorder) 10/07/2014  . Substance abuse (HCC) 10/07/2014  . Seasonal allergies   . ADD (attention deficit disorder)     History reviewed. No pertinent surgical history.      Home Medications    Prior to Admission medications   Medication Sig Start Date End Date Taking? Authorizing Provider  ciprofloxacin (CIPRO) 500 MG tablet Take 1 tablet (500 mg total) by mouth 2 (two) times daily. Patient not taking: Reported on 01/02/2018 12/08/16   Lavera Guise, MD  HYDROcodone-acetaminophen (NORCO/VICODIN) 5-325 MG tablet Take 1-2 tablets by mouth every 6 (six) hours as needed for moderate pain or severe pain. Patient not taking:  Reported on 01/02/2018 12/08/16   Lavera Guise, MD  metroNIDAZOLE (FLAGYL) 500 MG tablet Take 1 tablet (500 mg total) by mouth 2 (two) times daily. Patient not taking: Reported on 01/02/2018 12/08/16   Lavera Guise, MD  ondansetron (ZOFRAN) 4 MG tablet Take 1 tablet (4 mg total) by mouth every 8 (eight) hours as needed for nausea or vomiting. Patient not taking: Reported on 01/02/2018 12/08/16   Lavera Guise, MD    Family History Family History  Problem Relation Age of Onset  . Cancer Other   . Diabetes Other     Social History Social History   Tobacco Use  . Smoking status: Never Smoker  . Smokeless tobacco: Current User    Types: Chew, Snuff  Substance Use Topics  . Alcohol use: Yes  . Drug use: Yes    Types: Cocaine    Comment: last used meth and marijuana 2 days ago - 8/1     Allergies   Iohexol   Review of Systems Review of Systems  Unable to perform ROS: Patient unresponsive     Physical Exam Updated Vital Signs BP (!) 128/91   Pulse (!) 122   Temp (!) 93.6 F (34.2 C)   Resp (!) 24   Ht 5\' 10"  (1.778 m)   SpO2 91%   BMI 18.65 kg/m   Physical Exam Vitals signs and nursing note reviewed.  Constitutional:      Appearance: Normal appearance. He is normal weight.  HENT:     Head: Normocephalic and atraumatic.     Right Ear: External ear normal.     Left Ear: External ear normal.     Nose: Nose normal.  Eyes:     Conjunctiva/sclera: Conjunctivae normal.  Cardiovascular:     Rate and Rhythm: Tachycardia present.     Pulses: Normal pulses.  Pulmonary:     Breath sounds: Normal breath sounds.  Abdominal:     General: There is no distension.  Musculoskeletal:     Right lower leg: No edema.     Left lower leg: No edema.  Skin:    Capillary Refill: Capillary refill takes 2 to 3 seconds.  Neurological:     Mental Status: He is unresponsive.      ED Treatments / Results  Labs (all labs ordered are listed, but only abnormal results are displayed) Labs  Reviewed  CBC WITH DIFFERENTIAL/PLATELET - Abnormal; Notable for the following components:      Result Value   WBC 17.7 (*)    Platelets 518 (*)    Neutro Abs 14.3 (*)    Monocytes Absolute 1.1 (*)    Abs Immature Granulocytes 0.40 (*)    All other components within normal limits  COMPREHENSIVE METABOLIC PANEL - Abnormal; Notable for the following components:   CO2 15 (*)    Glucose, Bld 302 (*)    Creatinine, Ser 2.32 (*)    Calcium 8.4 (*)    Total Protein 5.8 (*)    Albumin 3.3 (*)    AST 1,035 (*)    ALT 1,101 (*)    GFR calc non Af Amer 38 (*)    GFR calc Af Amer 44 (*)    Anion gap 21 (*)    All other components within normal limits  RAPID URINE DRUG SCREEN, HOSP PERFORMED - Abnormal; Notable for the following components:   Opiates POSITIVE (*)    Cocaine POSITIVE (*)    Benzodiazepines POSITIVE (*)    Amphetamines POSITIVE (*)    Tetrahydrocannabinol POSITIVE (*)    All other components within normal limits  ACETAMINOPHEN LEVEL - Abnormal; Notable for the following components:   Acetaminophen (Tylenol), Serum <10 (*)    All other components within normal limits  URINALYSIS, ROUTINE W REFLEX MICROSCOPIC - Abnormal; Notable for the following components:   APPearance CLOUDY (*)    Glucose, UA >=500 (*)    Hgb urine dipstick MODERATE (*)    Protein, ur 100 (*)    Bacteria, UA MANY (*)    All other components within normal limits  I-STAT CHEM 8, ED - Abnormal; Notable for the following components:   Creatinine, Ser 2.00 (*)    Glucose, Bld 295 (*)    Calcium, Ion 1.08 (*)    TCO2 20 (*)    All other components within normal limits  I-STAT ARTERIAL BLOOD GAS, ED - Abnormal; Notable for the following components:   pH, Arterial 7.164 (*)    pO2, Arterial 196.0 (*)    Bicarbonate 15.9 (*)    TCO2 17 (*)    Acid-base deficit 13.0 (*)    All other components within normal limits  CBG MONITORING, ED - Abnormal; Notable for the following components:   Glucose-Capillary  267 (*)    All other components within normal limits  I-STAT TROPONIN, ED - Abnormal; Notable for the following components:   Troponin i, poc 0.21 (*)    All other components within normal limits  I-STAT ARTERIAL  BLOOD GAS, ED - Abnormal; Notable for the following components:   pH, Arterial 7.193 (*)    pO2, Arterial 57.0 (*)    Bicarbonate 13.8 (*)    TCO2 15 (*)    Acid-base deficit 14.0 (*)    All other components within normal limits  ETHANOL  SALICYLATE LEVEL  TSH  I-STAT CG4 LACTIC ACID, ED    EKG EKG Interpretation  Date/Time:  Thursday June 05 2018 17:51:44 EST Ventricular Rate:  100 PR Interval:    QRS Duration: 94 QT Interval:  393 QTC Calculation: 507 R Axis:   88 Text Interpretation:  Sinus tachycardia RSR' in V1 or V2, probably normal variant ST depr, consider ischemia, inferior leads Prolonged QT interval Confirmed by Pricilla LovelessGoldston, Scott [phone]([id number]) on 06/14/2018 5:58:27 PM   Radiology Ct Head Wo Contrast  Result Date: 06/04/2018 CLINICAL DATA:  Altered level of consciousness (LOC), unexplained. Post CPR. EXAM: CT HEAD WITHOUT CONTRAST TECHNIQUE: Contiguous axial images were obtained from the base of the skull through the vertex without intravenous contrast. COMPARISON:  Head CT 07/30/2014 FINDINGS: Brain: Mild diffuse sulcal effacement suggesting cerebral edema. Questionable loss gray-white differentiation in the occipital lobes versus motion artifact. No intracranial hemorrhage. No hydrocephalus, a ventricular size similar to prior exam. The basilar cisterns remains patent. No subdural collection. Vascular: No hyperdense vessel, slight decreased density of the tentorium favored to be secondary to cerebral edema. Skull: No fracture or focal lesion. Sinuses/Orbits: Fluid levels within the paranasal sinuses most prominent in the sphenoid, may be secondary to intubation. Mastoid air cells are clear. Other: None. IMPRESSION: Findings consistent with mild diffuse cerebral edema  with diffuse sulcal effacement. Questionable loss of gray-white differentiation in the occipital lobes versus motion artifact. These results were called by telephone at the time of interpretation on 06/25/2018 at 7:32 pm to Dr. Criss AlvineGoldston , who verbally acknowledged these results. Electronically Signed   By: Narda RutherfordMelanie  Sanford M.D.   On: 06/14/2018 19:33   Dg Chest Portable 1 View  Result Date: 06/24/2018 CLINICAL DATA:  ET tube placement.  Status post CPR. EXAM: PORTABLE CHEST 1 VIEW COMPARISON:  12/04/2008 FINDINGS: Endotracheal tube tip projects 3 cm above the carina. Cardiac silhouette is normal in size. No mediastinal or hilar masses or convincing adenopathy. There is hazy airspace opacity in the left perihilar and medial lung base distribution. Mild hazy airspace opacity is noted adjacent to the central lower lobe bronchovascular structures on the right. Remainder of the lungs is clear. Skeletal structures are grossly intact. IMPRESSION: 1. Well-positioned endotracheal tube. 2. Central lung opacities most evident on the left. Suspect asymmetric pulmonary edema. Consider pneumonia if there are consistent clinical findings. Electronically Signed   By: Amie Portlandavid  Ormond M.D.   On: 06/15/2018 18:14   Dg Abd Portable 1 View  Result Date: 06/21/2018 CLINICAL DATA:  Encounter for OG tube placement. EXAM: PORTABLE ABDOMEN - 1 VIEW COMPARISON:  None. FINDINGS: There is intestinal distension of the stomach, small bowel and RIGHT/transverse colon. The LEFT colon, sigmoid, and rectum are poorly visualized. Orogastric tube tip lies barely in the stomach, just past the gastroesophageal junction. The tube should be advanced several cm. IMPRESSION: Orogastric tube should be advanced several cm. Continued intestinal distension. See discussion above. Electronically Signed   By: Elsie StainJohn T Curnes M.D.   On: 07/03/2018 19:43    Procedures Procedures (including critical care time)  Medications Ordered in ED Medications    ampicillin-sulbactam (UNASYN) 1.5 g in sodium chloride 0.9 % 100 mL IVPB (0 g  Intravenous Stopped 06/28/2018 1950)  lactated ringers bolus 1,000 mL (has no administration in time range)  sodium bicarbonate 150 mEq in sterile water 1,000 mL infusion (has no administration in time range)  lactated ringers bolus 1,000 mL (0 mLs Intravenous Stopped 06/21/2018 1903)  lactated ringers bolus 1,000 mL (0 mLs Intravenous Stopped 06/29/2018 2021)     Initial Impression / Assessment and Plan / ED Course  I have reviewed the triage vital signs and the nursing notes.  Pertinent labs & imaging results that were available during my care of the patient were reviewed by me and considered in my medical decision making (see chart for details).     Patient is a 28 year old male who presents with above-stated history exam for further evaluation management after patient was found to be in cardiac arrest.  On presentation patient vital signs remarkable for tachycardia with hypotension with a map in the 50s is otherwise stable vital signs.  ET tube placement was confirmed by auscultating for bilateral breath sounds and color change and patient was placed on the ventilator.  Subsequent chest x-ray confirmed ET tube was in the correct location.  Given hypotension patient was given 2 L of IV fluids with resolution of his hypotension.  Patient did not require any vasopressors while the emergency department.  In addition Unasyn was given for concern of aspiration.  Initial CMP remarkable for NA 141, K5.1, CO2 15, glucose 302 creatinine 2.32, AST 1035, ALT 1101, AG 21.  CBC shows a WBC count of 17.7, hemoglobin 14.7, platelets 518.  Troponin 0 0.21.  ABG shows a pH of 7.164, PCO2 of 43.3, bicarb of 15.9.  CT head shows "Findings consistent with mild diffuse cerebral edema with diffuse sulcal effacement. Questionable loss of gray-white differentiation in the occipital lobes versus motion artifact."  Serum ethanol, acetaminophen, and  salicylates undetectable.  Repeat VBG shows a pH of 7.193, PCO2 of 34.4, bicarb of 13.8. UDS is positive for opiates, cocaine, benzos, amphetamines, and THC.  Patient admitted to ICU in guarded condition.  Final Clinical Impressions(s) / ED Diagnoses   Final diagnoses:  Cardiac arrest (HCC)  Lymphocytosis  AKI (acute kidney injury) (HCC)  Acidosis  Unresponsive  Elevated troponin  Polysubstance abuse (HCC)  Tachycardia  Transaminitis    ED Discharge Orders    None       Antoine Primas, MD 06/20/2018 2037    Pricilla Loveless, MD 06/06/18 [phone]

## 2018-06-05 NOTE — Progress Notes (Signed)
Pharmacy Antibiotic Note  Alan Hess is a 28 y.o. male admitted on 06/28/2018 with aspiration pneumonia.  Pharmacy has been consulted for Unasyn dosing.  Plan: Unasyn 1.5 gm IV q6h Will monitor renal function and cultures and sensitivities.  Height: 5\' 10"  (177.8 cm) IBW/kg (Calculated) : 73  Temp (24hrs), Avg:95 F (35 C), Min:95 F (35 C), Max:95 F (35 C)  Recent Labs  Lab 06/04/2018 1759 06/26/2018 1807  WBC 17.7*  --   CREATININE  --  2.00*    CrCl cannot be calculated (Unknown ideal weight.).    Allergies  Allergen Reactions  . Iohexol Hives and Itching     Code: HIVES, Desc: became itchy after injection, Onset Date: [id number]     Antimicrobials this admission: Unasyn 01/02 >>   Thank you for allowing pharmacy to be a part of this patient's care.   Jeanella Cara, PharmD, [facility] Clinical Pharmacist Please see AMION for all Pharmacists' Contact Phone Numbers 07/02/2018, 6:39 PM

## 2018-06-06 ENCOUNTER — Inpatient Hospital Stay (HOSPITAL_COMMUNITY): Payer: Medicaid Other

## 2018-06-06 DIAGNOSIS — R40243 Glasgow coma scale score 3-8, unspecified time: Secondary | ICD-10-CM

## 2018-06-06 DIAGNOSIS — G936 Cerebral edema: Secondary | ICD-10-CM | POA: Diagnosis not present

## 2018-06-06 DIAGNOSIS — I469 Cardiac arrest, cause unspecified: Secondary | ICD-10-CM

## 2018-06-06 DIAGNOSIS — T401X1A Poisoning by heroin, accidental (unintentional), initial encounter: Secondary | ICD-10-CM | POA: Diagnosis not present

## 2018-06-06 LAB — POCT I-STAT 3, ART BLOOD GAS (G3+)
ACID-BASE DEFICIT: 8 mmol/L — AB (ref 0.0–2.0)
ACID-BASE DEFICIT: 11 mmol/L — AB (ref 0.0–2.0)
Acid-base deficit: 8 mmol/L — ABNORMAL HIGH (ref 0.0–2.0)
Bicarbonate: 19.5 mmol/L — ABNORMAL LOW (ref 20.0–28.0)
Bicarbonate: 18.5 mmol/L — ABNORMAL LOW (ref 20.0–28.0)
Bicarbonate: 19.6 mmol/L — ABNORMAL LOW (ref 20.0–28.0)
O2 Saturation: 86 %
O2 Saturation: 81 %
O2 Saturation: 92 %
PH ART: 7.217 — AB (ref 7.350–7.450)
Patient temperature: 97.4
Patient temperature: 38.8
TCO2: 21 mmol/L — ABNORMAL LOW (ref 22–32)
TCO2: 20 mmol/L — ABNORMAL LOW (ref 22–32)
TCO2: 21 mmol/L — AB (ref 22–32)
pCO2 arterial: 52.5 mmHg — ABNORMAL HIGH (ref 32.0–48.0)
pCO2 arterial: 53.7 mmHg — ABNORMAL HIGH (ref 32.0–48.0)
pCO2 arterial: 49.2 mmHg — ABNORMAL HIGH (ref 32.0–48.0)
pH, Arterial: 7.193 — CL (ref 7.350–7.450)
pH, Arterial: 7.14 — CL (ref 7.350–7.450)
pO2, Arterial: 73 mmHg — ABNORMAL LOW (ref 83.0–108.0)
pO2, Arterial: 57 mmHg — ABNORMAL LOW (ref 83.0–108.0)
pO2, Arterial: 86 mmHg (ref 83.0–108.0)

## 2018-06-06 LAB — HEPATIC FUNCTION PANEL
ALBUMIN: 2.3 g/dL — AB (ref 3.5–5.0)
ALT: 614 U/L — ABNORMAL HIGH (ref 0–44)
AST: 636 U/L — ABNORMAL HIGH (ref 15–41)
Alkaline Phosphatase: 59 U/L (ref 38–126)
BILIRUBIN TOTAL: 0.8 mg/dL (ref 0.3–1.2)
Bilirubin, Direct: 0.2 mg/dL (ref 0.0–0.2)
Indirect Bilirubin: 0.6 mg/dL (ref 0.3–0.9)
Total Protein: 4.4 g/dL — ABNORMAL LOW (ref 6.5–8.1)

## 2018-06-06 LAB — BASIC METABOLIC PANEL
Anion gap: 15 (ref 5–15)
Anion gap: 12 (ref 5–15)
Anion gap: 13 (ref 5–15)
Anion gap: 13 (ref 5–15)
BUN: 20 mg/dL (ref 6–20)
BUN: 21 mg/dL — AB (ref 6–20)
BUN: 20 mg/dL (ref 6–20)
BUN: 17 mg/dL (ref 6–20)
CHLORIDE: 109 mmol/L (ref 98–111)
CO2: 17 mmol/L — AB (ref 22–32)
CO2: 18 mmol/L — ABNORMAL LOW (ref 22–32)
CO2: 16 mmol/L — ABNORMAL LOW (ref 22–32)
CO2: 15 mmol/L — ABNORMAL LOW (ref 22–32)
Calcium: 7.3 mg/dL — ABNORMAL LOW (ref 8.9–10.3)
Calcium: 6.6 mg/dL — ABNORMAL LOW (ref 8.9–10.3)
Calcium: 6.2 mg/dL — CL (ref 8.9–10.3)
Calcium: 6.7 mg/dL — ABNORMAL LOW (ref 8.9–10.3)
Chloride: 109 mmol/L (ref 98–111)
Chloride: 112 mmol/L — ABNORMAL HIGH (ref 98–111)
Chloride: 115 mmol/L — ABNORMAL HIGH (ref 98–111)
Creatinine, Ser: 2.15 mg/dL — ABNORMAL HIGH (ref 0.61–1.24)
Creatinine, Ser: 2.25 mg/dL — ABNORMAL HIGH (ref 0.61–1.24)
Creatinine, Ser: 2.19 mg/dL — ABNORMAL HIGH (ref 0.61–1.24)
Creatinine, Ser: 1.83 mg/dL — ABNORMAL HIGH (ref 0.61–1.24)
GFR calc Af Amer: 48 mL/min — ABNORMAL LOW (ref >60)
GFR calc Af Amer: 45 mL/min — ABNORMAL LOW (ref >60)
GFR calc Af Amer: 47 mL/min — ABNORMAL LOW (ref >60)
GFR calc Af Amer: 58 mL/min — ABNORMAL LOW (ref >60)
GFR calc non Af Amer: 41 mL/min — ABNORMAL LOW (ref >60)
GFR calc non Af Amer: 39 mL/min — ABNORMAL LOW (ref >60)
GFR calc non Af Amer: 40 mL/min — ABNORMAL LOW (ref >60)
GFR calc non Af Amer: 50 mL/min — ABNORMAL LOW (ref >60)
GLUCOSE: 149 mg/dL — AB (ref 70–99)
GLUCOSE: 174 mg/dL — AB (ref 70–99)
Glucose, Bld: 154 mg/dL — ABNORMAL HIGH (ref 70–99)
Glucose, Bld: 155 mg/dL — ABNORMAL HIGH (ref 70–99)
Potassium: 4 mmol/L (ref 3.5–5.1)
Potassium: 3.5 mmol/L (ref 3.5–5.1)
Potassium: 3.5 mmol/L (ref 3.5–5.1)
Potassium: 3.3 mmol/L — ABNORMAL LOW (ref 3.5–5.1)
Sodium: 141 mmol/L (ref 135–145)
Sodium: 139 mmol/L (ref 135–145)
Sodium: 141 mmol/L (ref 135–145)
Sodium: 143 mmol/L (ref 135–145)

## 2018-06-06 LAB — LACTIC ACID, PLASMA
Lactic Acid, Venous: 6.8 mmol/L (ref 0.5–1.9)
Lactic Acid, Venous: 7.3 mmol/L (ref 0.5–1.9)
Lactic Acid, Venous: 8.1 mmol/L (ref 0.5–1.9)
Lactic Acid, Venous: 6.6 mmol/L (ref 0.5–1.9)

## 2018-06-06 LAB — TROPONIN I
Troponin I: 3.65 ng/mL (ref <0.03)
Troponin I: 1.84 ng/mL (ref <0.03)
Troponin I: 1.6 ng/mL (ref <0.03)
Troponin I: 1.51 ng/mL (ref <0.03)
Troponin I: 1.43 ng/mL (ref <0.03)

## 2018-06-06 LAB — SODIUM: Sodium: 142 mmol/L (ref 135–145)

## 2018-06-06 LAB — CBC
HCT: 54.7 % — ABNORMAL HIGH (ref 39.0–52.0)
Hemoglobin: 18.1 g/dL — ABNORMAL HIGH (ref 13.0–17.0)
MCH: 30.4 pg (ref 26.0–34.0)
MCHC: 33.1 g/dL (ref 30.0–36.0)
MCV: 91.8 fL (ref 80.0–100.0)
PLATELETS: 350 10*3/uL (ref 150–400)
RBC: 5.96 MIL/uL — ABNORMAL HIGH (ref 4.22–5.81)
RDW: 12.5 % (ref 11.5–15.5)
WBC: 3.2 10*3/uL — ABNORMAL LOW (ref 4.0–10.5)
nRBC: 0 % (ref 0.0–0.2)

## 2018-06-06 LAB — ECHOCARDIOGRAM COMPLETE
Height: 70 in
Weight: 2296.31 oz

## 2018-06-06 LAB — HIV ANTIBODY (ROUTINE TESTING W REFLEX): HIV Screen 4th Generation wRfx: NONREACTIVE

## 2018-06-06 LAB — PHOSPHORUS: Phosphorus: 3.9 mg/dL (ref 2.5–4.6)

## 2018-06-06 LAB — MAGNESIUM: Magnesium: 1.6 mg/dL — ABNORMAL LOW (ref 1.7–2.4)

## 2018-06-06 MED ORDER — LACTATED RINGERS IV BOLUS
1000.0000 mL | Freq: Once | INTRAVENOUS | Status: AC
  Administered 2018-06-06: 1000 mL via INTRAVENOUS

## 2018-06-06 MED ORDER — CHLORHEXIDINE GLUCONATE 0.12% ORAL RINSE (MEDLINE KIT)
15.0000 mL | Freq: Two times a day (BID) | OROMUCOSAL | Status: DC
  Administered 2018-06-06 – 2018-06-09 (×8): 15 mL via OROMUCOSAL

## 2018-06-06 MED ORDER — FUROSEMIDE 10 MG/ML IJ SOLN
40.0000 mg | Freq: Once | INTRAMUSCULAR | Status: AC
  Administered 2018-06-06: 40 mg via INTRAVENOUS

## 2018-06-06 MED ORDER — SODIUM CHLORIDE 0.9 % IV SOLN
2.0000 g | INTRAVENOUS | Status: DC
  Administered 2018-06-06 – 2018-06-08 (×3): 2 g via INTRAVENOUS
  Filled 2018-06-06 (×3): qty 2

## 2018-06-06 MED ORDER — VANCOMYCIN HCL 10 G IV SOLR
1500.0000 mg | Freq: Once | INTRAVENOUS | Status: AC
  Administered 2018-06-06: 1500 mg via INTRAVENOUS
  Filled 2018-06-06: qty 1500

## 2018-06-06 MED ORDER — HYDROCORTISONE NA SUCCINATE PF 100 MG IJ SOLR
50.0000 mg | Freq: Four times a day (QID) | INTRAMUSCULAR | Status: DC
  Administered 2018-06-06 – 2018-06-08 (×11): 50 mg via INTRAVENOUS
  Filled 2018-06-06 (×11): qty 2

## 2018-06-06 MED ORDER — ACETAMINOPHEN 160 MG/5ML PO SOLN
500.0000 mg | Freq: Three times a day (TID) | ORAL | Status: AC | PRN
  Administered 2018-06-06 (×2): 500 mg
  Filled 2018-06-06 (×2): qty 20.3

## 2018-06-06 MED ORDER — MAGNESIUM SULFATE 2 GM/50ML IV SOLN
2.0000 g | Freq: Once | INTRAVENOUS | Status: AC
  Administered 2018-06-06: 2 g via INTRAVENOUS
  Filled 2018-06-06: qty 50

## 2018-06-06 MED ORDER — PANTOPRAZOLE SODIUM 40 MG IV SOLR
40.0000 mg | INTRAVENOUS | Status: DC
  Administered 2018-06-06 – 2018-06-08 (×3): 40 mg via INTRAVENOUS
  Filled 2018-06-06 (×3): qty 40

## 2018-06-06 MED ORDER — CALCIUM GLUCONATE-NACL 1-0.675 GM/50ML-% IV SOLN
1.0000 g | Freq: Once | INTRAVENOUS | Status: AC
  Administered 2018-06-06: 1000 mg via INTRAVENOUS
  Filled 2018-06-06: qty 50

## 2018-06-06 MED ORDER — VASOPRESSIN 20 UNIT/ML IV SOLN
0.0300 [IU]/min | INTRAVENOUS | Status: DC
  Administered 2018-06-06 – 2018-06-07 (×3): 0.03 [IU]/min via INTRAVENOUS
  Administered 2018-06-09: 0.01 [IU]/min via INTRAVENOUS
  Filled 2018-06-06 (×5): qty 2

## 2018-06-06 MED ORDER — ORAL CARE MOUTH RINSE
15.0000 mL | OROMUCOSAL | Status: DC
  Administered 2018-06-06 – 2018-06-09 (×34): 15 mL via OROMUCOSAL

## 2018-06-06 MED ORDER — VANCOMYCIN HCL IN DEXTROSE 1-5 GM/200ML-% IV SOLN
1000.0000 mg | INTRAVENOUS | Status: DC
  Administered 2018-06-07 – 2018-06-08 (×2): 1000 mg via INTRAVENOUS
  Filled 2018-06-06: qty 200

## 2018-06-06 MED ORDER — NOREPINEPHRINE 16 MG/250ML-% IV SOLN
0.0000 ug/min | INTRAVENOUS | Status: DC
  Administered 2018-06-06: 2 ug/min via INTRAVENOUS
  Administered 2018-06-06: 90 ug/min via INTRAVENOUS
  Administered 2018-06-06: 30 ug/min via INTRAVENOUS
  Administered 2018-06-06: 90 ug/min via INTRAVENOUS
  Administered 2018-06-06: 45 ug/min via INTRAVENOUS
  Administered 2018-06-07: 26 ug/min via INTRAVENOUS
  Administered 2018-06-08: 15 ug/min via INTRAVENOUS
  Administered 2018-06-09: 4 ug/min via INTRAVENOUS
  Filled 2018-06-06 (×8): qty 250

## 2018-06-06 MED ORDER — FUROSEMIDE 10 MG/ML IJ SOLN
INTRAMUSCULAR | Status: AC
  Filled 2018-06-06: qty 4

## 2018-06-06 MED ORDER — SODIUM CHLORIDE 3 % IV SOLN
INTRAVENOUS | Status: DC
  Administered 2018-06-06 (×3): 75 mL/h via INTRAVENOUS
  Administered 2018-06-07 (×3): 125 mL/h via INTRAVENOUS
  Filled 2018-06-06 (×19): qty 500

## 2018-06-06 NOTE — Progress Notes (Signed)
I responded to a page from the nurse to provide spiritual support for the patient's family. I visited the patient's room with his mother present. The patient's mother is overcome with emotion concerning her son's health status. I provided spiritual support through pastoral presence, by reading Scripture, and by leading in prayer. I shared that the Chaplain is available for additional support as needed or requested.    06/06/18 1339  Clinical Encounter Type  Visited With Patient and family together  Visit Type Follow-up;Spiritual support  Referral From Nurse  Consult/Referral To Chaplain  Spiritual Encounters  Spiritual Needs Prayer;Emotional  Stress Factors  Patient Stress Factors None identified  Family Stress Factors Exhausted    Chaplain Dr Melvyn Novas

## 2018-06-06 NOTE — Progress Notes (Signed)
  Echocardiogram 2D Echocardiogram has been performed.  Alan Hess  Alan Hess 06/06/2018, 4:42 PM

## 2018-06-06 NOTE — Plan of Care (Signed)
Pt is not progressing with care plan due to intubation & unresponsiveness.    Kristian Covey, RN

## 2018-06-06 NOTE — Progress Notes (Signed)
Nutrition Brief Note  Pt with hx of polysubstance abuse; admitted s/p cardiac arrest. Chart reviewed; spoke with RN; prognosis is very poor. No nutrition interventions warranted at this time.  Please consult as needed.   Maureen Chatters, RD, LDN Pager #: [phone] After-Hours Pager #: [phone]

## 2018-06-06 NOTE — Progress Notes (Addendum)
Pharmacy Antibiotic Note  Alan Hess is a 28 y.o. male admitted on 06/04/2018 with aspiration pneumonia, now with sepsis. Pharmacy has been consulted for Vancomycin and cefepime dosing. Patients WBC on admit 17.7, but down to 3.2 this AM. Patients Tmax today is 103.1, and SCr elevated at 2.25. Blood cultures currently remain negative.  Plan: Start Vancomycin 1500 mg IV x 1, then start vancomycin 1000 mg IV Q24h on 1/4 at 1130 (expected AUC 499 using Scr of 2.25) Start Cefepime 2g IV Q24h Stop Unasyn Will monitor renal function and cultures and sensitivities.   Height: 5\' 10"  (177.8 cm) Weight: 143 lb 8.3 oz (65.1 kg) IBW/kg (Calculated) : 73  Temp (24hrs), Avg:98.4 F (36.9 C), Min:92.1 F (33.4 C), Max:103.1 F (39.5 C)  Recent Labs  Lab 06/26/2018 1759 06/07/2018 1807 June 08, 2018 2125 06/06/18 0357 06/06/18 0455 06/06/18 0908  WBC 17.7*  --   --   --  3.2*  --   CREATININE 2.32* 2.00*  --  2.15*  --  2.25*  LATICACIDVEN  --   --  11.28*  --   --   --     Estimated Creatinine Clearance: 46.2 mL/min (A) (by C-G formula based on SCr of 2.25 mg/dL (H)).    Allergies  Allergen Reactions  . Iohexol Hives and Itching     Code: HIVES, Desc: became itchy after injection, Onset Date: [id number]    Microbiology: 01/02 Bcx: NG < 12 hr 01/02 Trach aspirate: sent 01/02 MRSA PCR: negative Ucx: sent  Antimicrobials this admission: Vancomycin 01/03 >> Cefepime 01/03 >> Unasyn 01/02 >> 1/2  Thank you for allowing pharmacy to be a part of this patient's care.  Lenord Carbo, PharmD PGY1 Pharmacy Resident Phone: [phone]  Please check AMION for all [facility] Pharmacy phone numbers 06/06/2018, 10:51 AM

## 2018-06-06 NOTE — Procedures (Signed)
Arterial Catheter Insertion Procedure Note Alan Hess [id number] 1990/10/03  Procedure: Insertion of Arterial Catheter  Indications: Blood pressure monitoring and Frequent blood sampling  Procedure Details Consent: Unable to obtain consent because of altered level of consciousness. Time Out: Verified patient identification, verified procedure, site/side was marked, verified correct patient position, special equipment/implants available, medications/allergies/relevent history reviewed, required imaging and test results available.  Performed  Maximum sterile technique was used including antiseptics, cap, gloves, gown, hand hygiene, mask and sheet. Skin prep: Chlorhexidine; local anesthetic administered 22 gauge catheter was inserted into left radial artery using the Seldinger technique. ULTRASOUND GUIDANCE USED: NO Evaluation Blood flow good; BP tracing good. Complications: No apparent complications.   Armando Gang 06/06/2018

## 2018-06-06 NOTE — Progress Notes (Signed)
During change of shift pt's pupils were assessed, right pupil size 5, fixed and left size 2 fixed. SBP in 70's, pt maxed on Levo and Vaso. O2 sat 80% on 100% Fio2. Critical Care MD called and on way to bedside. Will continue to monitor pt.

## 2018-06-06 NOTE — Progress Notes (Signed)
eLink Physician-Brief Progress Note Patient Name: Alan BurlySamuel L Cordle DOB: [birth year] MRN: [id number]   Date of Service  06/06/2018  HPI/Events of Note  MAP getting softer 68.  maxed out on Levophed.   eICU Interventions  Start Vaso 0.03 continuous. Stress dose steroids for septic shock/ARDS/asp PNA.  Too unstable to go for CT head or MRI, on 100% fio2 to see any worsening cerebral edema.  Discussed with bed side RN.      Intervention Category Major Interventions: Hypotension - evaluation and management;Shock - evaluation and management  Ranee GosselinKinila T Mohan 06/06/2018, 6:44 AM

## 2018-06-06 NOTE — Consult Note (Signed)
NEURO HOSPITALIST CONSULT NOTE   Requestig physician: Dr. Elwyn Reach  Reason for Consult: Suspected anoxic brain injury s/p cardiac arrest at home  History obtained from:  Chart and Sister  HPI:                                                                                                                                          Alan Hess is an 28 y.o. male presenting from home via EMS after being found down at home requiring resuscitation. Per the paient's sister, she found him sleeping at home with irregular respirations - "he would take a few breaths, then stop, then start breathing again", "it sounded like he was snoring". He appeared cyanotic to family members per report. Afterwards, his breathing stopped completely, so his mother started CPR with "chest compressions and mouth to mouth" per the patient's sister. The CPR was continued by family for about 15 minutes. On EMS arrival, the patient was noted to still be cyanotic and initial rhythm was asystolic. CPR was continued for another 16 minutes before ROSC at 1707. He was intubated by EMS.  On arrival to the ED, he had significantly dilated pupils with no response to painful stimuli. Initially hypotensive, he became hypertensive. His pupils became more normalized while in the ED.   EtOH level was < 10. Salicylate level < 7. Tox screen was positive for amphetamine, benzodiazepine, opiate, cocaine and THC.   ABG was with a Ph of 7.2, PCO2 of 56 and PO2 of 58 with acid base deficit of 6. Cr was 2. AST 1035 and ALT 1101. Troponin was 0.74. Lactate was 11.28. WBC 17.7.   CT head: Findings consistent with mild diffuse cerebral edema with diffuse sulcal effacement. Questionable loss of gray-white differentiation in the occipital lobes versus motion artifact.  The patient has a history of ADD, anxiety and depression.   Past Medical History:  Diagnosis Date  . ADD (attention deficit disorder)   . Anxiety   .  Depression   . Seasonal allergies     History reviewed. No pertinent surgical history.  Family History  Problem Relation Age of Onset  . Cancer Other   . Diabetes Other               Social History:  reports that he has never smoked. His smokeless tobacco use includes chew and snuff. He reports current alcohol use. He reports current drug use. Drug: Cocaine.  Allergies  Allergen Reactions  . Iohexol Hives and Itching     Code: HIVES, Desc: became itchy after injection, Onset Date: [id number]     MEDICATIONS:  Prior to Admission:  Medications Prior to Admission  Medication Sig Dispense Refill Last Dose  . ciprofloxacin (CIPRO) 500 MG tablet Take 1 tablet (500 mg total) by mouth 2 (two) times daily. (Patient not taking: Reported on 01/02/2018) 28 tablet 0 Completed Course at Unknown time  . HYDROcodone-acetaminophen (NORCO/VICODIN) 5-325 MG tablet Take 1-2 tablets by mouth every 6 (six) hours as needed for moderate pain or severe pain. (Patient not taking: Reported on 01/02/2018) 6 tablet 0 Completed Course at Unknown time  . metroNIDAZOLE (FLAGYL) 500 MG tablet Take 1 tablet (500 mg total) by mouth 2 (two) times daily. (Patient not taking: Reported on 01/02/2018) 14 tablet 0 Completed Course at Unknown time  . ondansetron (ZOFRAN) 4 MG tablet Take 1 tablet (4 mg total) by mouth every 8 (eight) hours as needed for nausea or vomiting. (Patient not taking: Reported on 01/02/2018) 20 tablet 0 Completed Course at Unknown time   Scheduled: . chlorhexidine gluconate (MEDLINE KIT)  15 mL Mouth Rinse BID  . heparin  5,000 Units Subcutaneous Q8H  . mouth rinse  15 mL Mouth Rinse 10 times per day   Continuous: . sodium chloride 10 mL/hr at 06/06/18 0400  . ampicillin-sulbactam (UNASYN) IV Stopped (06/06/18 0105)  . norepinephrine (LEVOPHED) Adult infusion    .  sodium bicarbonate  (isotonic) infusion in sterile water 75 mL/hr at 06/06/18 0400     ROS:                                                                                                                                       Unable to obtain due to coma.    Blood pressure 126/68, pulse (!) 142, temperature (!) 102.9 F (39.4 C), resp. rate (!) 22, height 5' 10"  (1.778 m), SpO2 90 %.   General Examination:                                                                                                       Physical Exam  HEENT-  [place]/AT    Lungs- Intubated Skin- Cold extremities in the context of cooling protocol for fever  Neurological Examination Mental Status: Comatose with no spontaneous movement. No response to any external stimuli.  Cranial Nerves: II: No blink to threat. Pupils 3 mm and unreactive.   III,IV, VI: No oculocephalic reflex.  V,VII: No corneal reflex bilaterally.  VIII: No response to voice IX,X: Intubated XI: Tone of nuchal musculature is flaccid.  XII: Intubated Motor/Sensory: Flaccid tone all  4 extremities.  No movement to any external stimuli, including sternal rub, arm pinch and noxious plantar stimulation bilaterally.  Deep Tendon Reflexes: 0 biceps, brachioradialis, patellae and achilles bilaterally. Toes mute.  Cerebellar/Gait: Unable to assess     Lab Results: Basic Metabolic Panel: Recent Labs  Lab 06/17/2018 1759 06/25/2018 1807  NA 141 138  K 5.1 4.9  CL 105 104  CO2 15*  --   GLUCOSE 302* 295*  BUN 11 14  CREATININE 2.32* 2.00*  CALCIUM 8.4*  --     CBC: Recent Labs  Lab 06/14/2018 1759 06/14/2018 1807  WBC 17.7*  --   NEUTROABS 14.3*  --   HGB 14.7 15.3  HCT 47.2 45.0  MCV 99.8  --   PLT 518*  --     Cardiac Enzymes: No results for input(s): CKTOTAL, CKMB, CKMBINDEX, TROPONINI in the last 168 hours.  Lipid Panel: No results for input(s): CHOL, TRIG, HDL, CHOLHDL, VLDL, LDLCALC in the last 168 hours.  Imaging: Dg Abd 1 View  Result Date:  07/01/2018 CLINICAL DATA:  Orogastric tube placement. EXAM: ABDOMEN - 1 VIEW COMPARISON:  Abdominal radiograph performed earlier today at 7:08 p.m. FINDINGS: The patient's enteric tube is noted coiling at the antrum of the stomach. The stomach is diffusely distended with air, slightly improved from the prior study. The remaining bowel is also distended with air. No free intra-abdominal air is identified, though evaluation for free air is limited on a single supine view. No acute osseous abnormalities are identified. IMPRESSION: Enteric tube noted coiling at the antrum of the stomach; the stomach is diffusely distended with air, slightly improved from the prior study. The remaining bowel is also distended with air. Electronically Signed   By: Garald Balding M.D.   On: 06/25/2018 23:04   Ct Head Wo Contrast  Result Date: 06/29/2018 CLINICAL DATA:  Altered level of consciousness (LOC), unexplained. Post CPR. EXAM: CT HEAD WITHOUT CONTRAST TECHNIQUE: Contiguous axial images were obtained from the base of the skull through the vertex without intravenous contrast. COMPARISON:  Head CT 07/30/2014 FINDINGS: Brain: Mild diffuse sulcal effacement suggesting cerebral edema. Questionable loss gray-white differentiation in the occipital lobes versus motion artifact. No intracranial hemorrhage. No hydrocephalus, a ventricular size similar to prior exam. The basilar cisterns remains patent. No subdural collection. Vascular: No hyperdense vessel, slight decreased density of the tentorium favored to be secondary to cerebral edema. Skull: No fracture or focal lesion. Sinuses/Orbits: Fluid levels within the paranasal sinuses most prominent in the sphenoid, may be secondary to intubation. Mastoid air cells are clear. Other: None. IMPRESSION: Findings consistent with mild diffuse cerebral edema with diffuse sulcal effacement. Questionable loss of gray-white differentiation in the occipital lobes versus motion artifact. These results  were called by telephone at the time of interpretation on 06/05/2018 at 7:32 pm to Dr. Regenia Skeeter , who verbally acknowledged these results. Electronically Signed   By: Keith Rake M.D.   On: 06/11/2018 19:33   Dg Chest Port 1 View  Result Date: 06/06/2018 CLINICAL DATA:  Encounter for central line placement. EXAM: PORTABLE CHEST 1 VIEW COMPARISON:  Radiograph yesterday at 2230 hour FINDINGS: Left internal jugular central venous catheter tip in the proximal SVC. No pneumothorax. Endotracheal tube tip 6 cm from the carina. Enteric tube in place, tip below the diaphragm not included in the field of view. The heart is normal in size. Do's bilateral perihilar opacities are similar to prior exam, slight improvement suspected in the right upper lung. No pleural  fluid. IMPRESSION: 1. Tip of the left central line in the proximal SVC. No pneumothorax. 2. Bilateral perihilar opacities are similar to prior exam. Differential considerations include pulmonary edema versus aspiration/pneumonia. Electronically Signed   By: Keith Rake M.D.   On: 06/06/2018 00:57   Dg Chest Port 1 View  Result Date: 07/03/2018 CLINICAL DATA:  Endotracheal tube and orogastric tube placement. EXAM: PORTABLE CHEST 1 VIEW COMPARISON:  Chest radiograph performed earlier today at 5:50 p.m. FINDINGS: The patient's endotracheal tube is seen ending 6-7 cm above the carina. The patient's enteric tube is seen extending below the diaphragm. Diffuse bilateral airspace opacification raises concern for multifocal pneumonia. No definite pleural effusion or pneumothorax is seen, though the left costophrenic angle is incompletely imaged on this study. The cardiomediastinal silhouette is normal in size. No acute osseous abnormalities are identified. IMPRESSION: 1. Endotracheal tube seen ending 6-7 cm above the carina. 2. Diffuse bilateral airspace opacification raises concern for multifocal pneumonia. Electronically Signed   By: Garald Balding M.D.   On:  06/29/2018 23:03   Dg Chest Portable 1 View  Result Date: 06/04/2018 CLINICAL DATA:  ET tube placement.  Status post CPR. EXAM: PORTABLE CHEST 1 VIEW COMPARISON:  12/04/2008 FINDINGS: Endotracheal tube tip projects 3 cm above the carina. Cardiac silhouette is normal in size. No mediastinal or hilar masses or convincing adenopathy. There is hazy airspace opacity in the left perihilar and medial lung base distribution. Mild hazy airspace opacity is noted adjacent to the central lower lobe bronchovascular structures on the right. Remainder of the lungs is clear. Skeletal structures are grossly intact. IMPRESSION: 1. Well-positioned endotracheal tube. 2. Central lung opacities most evident on the left. Suspect asymmetric pulmonary edema. Consider pneumonia if there are consistent clinical findings. Electronically Signed   By: Lajean Manes M.D.   On: 06/16/2018 18:14   Dg Abd Portable 1 View  Result Date: 06/06/2018 CLINICAL DATA:  Encounter for OG tube placement. EXAM: PORTABLE ABDOMEN - 1 VIEW COMPARISON:  None. FINDINGS: There is intestinal distension of the stomach, small bowel and RIGHT/transverse colon. The LEFT colon, sigmoid, and rectum are poorly visualized. Orogastric tube tip lies barely in the stomach, just past the gastroesophageal junction. The tube should be advanced several cm. IMPRESSION: Orogastric tube should be advanced several cm. Continued intestinal distension. See discussion above. Electronically Signed   By: Staci Righter M.D.   On: 06/21/2018 19:43    Assessment: 28 year old male presenting to the hospital following cardiac arrest. 1. Given prolonged down time, cerebral edema on CT, no volitional or reflex responses of trunk or limbs to external stimuli and no brainstem reflexes, the prognosis may be poor, but will need to be reassessed 72 hours after presentation while temperature is normalized and while continuing to be off all sedation.  2. No clinical evidence for seizure  activity on exam. No reported seizure activity in the field.  3. Cardiac and respiratory arrest may have been due to a toxic level of illicit substance ingestion, given multiple positives on urine toxicology, including opiates, cocaine and amphetamine  Recommendations: 1. Continue supportive care 2. EEG 3. MRI brain 4. Repeat neurological exam in 72 hours.   40 minutes spent in the emergent neurological evaluation and management of this critically ill patient.  Electronically signed: Dr. Kerney Elbe 06/06/2018, 3:49 AM

## 2018-06-06 NOTE — Progress Notes (Signed)
eLink Physician-Brief Progress Note Patient Name: Alan Hess DOB: 1990/08/26 MRN: [id number]   Date of Service  06/06/2018  HPI/Events of Note  Asking for tylenol was hypothermic in ED. Bear hugger. Elevated LFT from shock liver.  eICU Interventions  Tylenol liquid via tube ordered prn. Follow AST/ALT.      Intervention Category Minor Interventions: Routine modifications to care plan (e.g. PRN medications for pain, fever)  Ranee Gosselin 06/06/2018, 1:19 AM

## 2018-06-06 NOTE — Procedures (Signed)
Central Venous Catheter Insertion Procedure Note Alan Hess [id number] 13-Sep-1990  Procedure: Insertion of Central Venous Catheter Indications: Assessment of intravascular volume, Drug and/or fluid administration and Frequent blood sampling  Procedure Details Consent: Risks of procedure as well as the alternatives and risks of each were explained to the (patient/caregiver).  Consent for procedure obtained. Time Out: Verified patient identification, verified procedure, site/side was marked, verified correct patient position, special equipment/implants available, medications/allergies/relevent history reviewed, required imaging and test results available.  Performed  Maximum sterile technique was used including antiseptics, cap, gloves, gown, hand hygiene, mask and sheet. Skin prep: Chlorhexidine; local anesthetic administered A antimicrobial bonded/coated triple lumen catheter was placed in the left internal jugular vein using the Seldinger technique.  Evaluation Blood flow good Complications: No apparent complications Patient did tolerate procedure well. Chest X-ray ordered to verify placement.  CXR: pending.  Procedure performed under direct ultrasound guidance for real time vessel cannulation.      Alan Hess, [place] [facility] Pager: [phone]  or [phone] 06/06/2018, 12:07 AM

## 2018-06-06 NOTE — Progress Notes (Signed)
eLink Physician-Brief Progress Note Patient Name: TOWAN RAMSDELL DOB: [birth year] MRN: [id number]   Date of Service  06/06/2018  HPI/Events of Note  Troponin jumped to 3. Camera: Sinus tachy. MAP 76 on Levophed. Discussed with bed side RN. Low GCS of 3 still. Making 30 ml/hr urine.   eICU Interventions  Follow Troponin at 8.15 Routine Cardiology consult for now.  EKG once. lokks like elevated troponin from cardiogenic demand ischemia/CPR at home.  ABG stable.follwoing ARDS protocol.      Intervention Category Intermediate Interventions: Diagnostic test evaluation  Ranee Gosselin 06/06/2018, 6:16 AM

## 2018-06-06 NOTE — Progress Notes (Signed)
CRITICAL VALUE ALERT  Critical Value:  Lactic Acid 6.6  Date & Time Notied:  Today, now   Provider Notified: Elink  Orders Received/Actions taken: pending

## 2018-06-06 NOTE — Progress Notes (Signed)
Assisted with ET tube change from 6.o to 7.5. Secured at 24 ATL. No current respiratory distress.

## 2018-06-06 NOTE — Progress Notes (Signed)
CRITICAL VALUE ALERT  Critical Value:  Lactic Acid 7.3  Date & Time Notied:  06/06/18 1956  Provider Notified: MD aware, at bedside   Orders Received/Actions taken: LR bolus ordered

## 2018-06-06 NOTE — Progress Notes (Signed)
Inpatient Diabetes Program Recommendations  AACE/ADA: New Consensus Statement on Inpatient Glycemic Control (2015)  Target Ranges:  Prepandial:   less than 140 mg/dL      Peak postprandial:   less than 180 mg/dL (1-2 hours)      Critically ill patients:  140 - 180 mg/dL   Lab Results  Component Value Date   GLUCAP 267 (H) 06-24-18   HGBA1C  01/26/2010    5.4 (NOTE)                                                                       According to the ADA Clinical Practice Recommendations for 2011, when HbA1c is used as a screening test:   >=6.5%   Diagnostic of Diabetes Mellitus           (if abnormal result  is confirmed)  5.7-6.4%   Increased risk of developing Diabetes Mellitus  References:Diagnosis and Classification of Diabetes Mellitus,Diabetes Care,2011,34(Suppl 1):S62-S69 and Standards of Medical Care in         Diabetes - 2011,Diabetes Care,2011,34  (Suppl 1):S11-S61.    Review of Glycemic Control Results for TREYMON, PIRAINO (MRN [id number]) as of 06/06/2018 09:50  Ref. Range 24-Jun-2018 17:56  Glucose-Capillary Latest Ref Range: 70 - 99 mg/dL 032 (H)   Diabetes history: No DM hx Outpatient Diabetes medications: none Current orders for Inpatient glycemic control: None  Inpatient Diabetes Program Recommendations:    Noted administration of Solumedrol 50 mg Q6H, thus anticipate CBGs to trends to increase.  Although no DM history, may want to consider adding Novolog 1-3 units Q4H under the ICU protocol.   Thanks, Lujean Rave, MSN, RNC-OB Diabetes Coordinator [phone] (8a-5p)

## 2018-06-06 NOTE — Progress Notes (Signed)
Confirmation call placed to [facility] to ensure referral had been made. Referral # [id number]. Updated on neuro status and worsening conditions.

## 2018-06-06 NOTE — Progress Notes (Signed)
   06/06/18 0900  Clinical Encounter Type  Visited With Patient and family together;Health care provider  Visit Type Follow-up;Spiritual support;Critical Care  Referral From Nurse;Chaplain  Consult/Referral To Chaplain  Spiritual Encounters  Spiritual Needs Sacred text;Prayer  Responded to call from chaplain Trula Ore for family support. Mother and sister is at bedside. Family has been informed of the critical nature of patient. Mother is hoping for full recovery in spite of what doctors are telling her. Chaplain read sacred text and prayed with family at bedside. Also provided songs of comfort at bedside. Will forward to [employer]. Provided spiritual and emotional support.

## 2018-06-06 NOTE — Plan of Care (Signed)
Family discussion held with Alan Hess, mother. 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 Ms. [name] became upset by the news and requested that the team to continue to treat the patient. Chaplain support was offered.  Plan: Continue current medical management.

## 2018-06-06 NOTE — Progress Notes (Signed)
Spoke w/ Dr. Otelia Limes, on-call Neurologist, about pt's Na level of 143. Order-set goal is 150-160. Verbal order given to increase Hypertonic solution to 122mL/hr.

## 2018-06-06 NOTE — Progress Notes (Signed)
CRITICAL VALUE ALERT  Critical Value:  Trop 3.65  Date & Time Notied:  Today, now  Provider Notified: elink RN  Orders Received/Actions taken: pending

## 2018-06-06 NOTE — Progress Notes (Signed)
NAME:  Alan Hess, MRN:  [id number], DOB:  03/20/1991, LOS: 1 ADMISSION DATE:  06/20/2018, CONSULTATION DATE:  06/18/2018 REFERRING MD: ED provider, CHIEF COMPLAINT: Cardiac arrest  Brief History   28 year old male with history of polysubstance abuse coming in post cardiac arrest.  History of present illness   Alan Hess is a 28 year old male with PMHx of ADD, anxiety, polysubstance abuse and depression presented to the emergency room after cardiac arrest.  Patient was found cyanotic by his family members for unknown time, apparently mother and sister tried CPR with mouth-to-mouth breathing for 10 to 15 minutes until EMS arrives, when EMS arrived patient was in PEA started CPR for around 16-minute had one round of epinephrine and Narcan finally had a return of circulation.  Patient was intubated and brought into the emergency room.  Patient remained obtunded and unresponsive without any sedation.  He was found to have a positive UDS for amphetamine, benzo, opioids, cocaine and marijuana. PCCM was consulted for further management.  Past Medical History  -Anxiety -Polysubstance abuse -ADD  Significant Hospital Events   -Intubated 1/2  Consults:  Neurology.  Procedures:  ET tube. 06/30/2018 Central line.06/25/2018 Arterial line. 06/04/2018  Significant Diagnostic Tests:  CT head 07/01/2018 Findings consistent with mild diffuse cerebral edema with diffuse sulcal effacement. Questionable loss of gray-white differentiation in the occipital lobes versus motion artifact.  CT Head.06/06/18.  Shows severe global anoxic injury with extensive infarcts in the supra and infratentorial brain.  There was elevated intracranial pressure with generalized subarachnoid space effacement consistent with pseudo-subarachnoid sign.  Micro Data:  Blood Cx.06/20/2018>> no growth in 12 hours Respiratory Cx. 06/04/2018.  Antimicrobials:  Unasyn 1/2>>2/2 Cefepime.1/3>> Vancomycin.1/3>>  Interim history/subjective:    Patient developed worsening hypoxia and hypotension requiring maximum of vent settings with increased in levo to 75 and vasopressin. Right eye dilated with no reflex.  Objective   Blood pressure (!) 112/91, pulse (!) 132, temperature 99.3 F (37.4 C), resp. rate 20, height 5\' 10"  (1.778 m), weight 65.1 kg, SpO2 (!) 88 %.    Vent Mode: PRVC FiO2 (%):  [50 %-100 %] 100 % Set Rate:  [24 bmp-30 bmp] 30 bmp Vt Set:  [480 mL-580 mL] 480 mL PEEP:  [5 cmH20-12 cmH20] 12 cmH20 Plateau Pressure:  [20 cmH20-36 cmH20] 28 cmH20   Intake/Output Summary (Last 24 hours) at 06/06/2018 1023 Last data filed at 06/06/2018 1000 Gross per 24 hour  Intake 6844.43 ml  Output 1115 ml  Net 5729.43 ml   Filed Weights   06/06/18 0402  Weight: 65.1 kg    Examination: General: Critically ill appearing young adult, intubated and unresponsive. HENT: Right pupil dilated with no reflex, left normal-appearing with positive light reflex.  ETT in place. Lungs: Clear to anterior auscultation bilaterally.  No crackles or wheezing. Cardiovascular: Tachycardic with regular rhythm, no murmur. Abdomen: Soft, nondistended, bowel sounds positive. Extremities: No edema, no cyanosis, pulses intact and symmetrical. Neuro: Unresponsive, not following any commands. GU: Foley in place.  Resolved Hospital Problem list     Assessment & Plan:  Anoxic brain injury s/p cardiac arrest.  Patient arrested at home, most likely secondary to polysubstance abuse.  Made unresponsive without any sedation.  Becoming hypotensive requiring two higher dose of pressors.  He was given 2 boluses. There was some concern for herniation this morning due to fixed dilated right pupil, no posturing.  CT head was obtained which shows severe anoxic brain injury with extensive supra and infratentorial infarct involving brainstem and  cerebral edema with increased intracranial pressure. -Continue vent support. -Started him on 3% hypertonic saline at 75  mL/h. -Not a candidate for mannitol due to AKI. -Continue frequent neuro checks. -Continue Levophed-we can maximize up to 200. -Continue vasopressin. -BMP every 8 hourly as patient is on hypertonic saline. -Broaden up antibiotics to cefepime and Vanco, because of his critical condition, infection is less likely at this time. -Continue trending lactic acid and troponin. -Continue stress dose steroids. Patient's prognosis remains poor.  Will need a repeat neurologic exam in 48 to 72 hours.  Acute hypoxic respiratory failure requiring mechanical ventilation. Most likely secondary to cardiac arrest and anoxic brain injury. Currently he is requiring maximum vent settings.  Patient was desaturating this morning which improved with exchange of ETT. ABG continue to have metabolic acidosis with wide A-a gradient. Chest x-ray with bilateral infiltrate. -Continue full support vent.  AKI with metabolic acidosis.  Most likely secondary to hypoxia.  Lactic acid in the beginning was above 11, not trending down with current number of 6.8.  Creatinine at 2.15 with baseline below 1. Patient did receive multiple boluses.  He was started on bicarb infusion in ED. -Discontinue bicarb infusion as pH is above 7. -Continue monitoring renal functions.  Hypomagnesemia.  Magnesium at 1.6. -Repeat electrolyte and keep monitoring.  Transaminitis.  Most likely due to shock liver secondary to hypoxemia with cardiac arrest.  AST and ALT trending down this morning. -Keep monitoring CMP daily.  Best practice:  Diet: NPO Pain/Anxiety/Delirium protocol (if indicated): NA VAP protocol (if indicated):  DVT prophylaxis: Heparin and SCDs GI prophylaxis: Protonix Glucose control:  Mobility:  Code Status: Full Family Communication: Family was updated this morning. Disposition:   Labs   CBC: Recent Labs  Lab 06/17/2018 1759 06/06/2018 1807 06/06/18 0455  WBC 17.7*  --  3.2*  NEUTROABS 14.3*  --   --   HGB 14.7  15.3 18.1*  HCT 47.2 45.0 54.7*  MCV 99.8  --  91.8  PLT 518*  --  350    Basic Metabolic Panel: Recent Labs  Lab 06/16/2018 1759 06/11/2018 1807 06/06/18 0357 06/06/18 0908  NA 141 138 141 139  K 5.1 4.9 4.0 3.5  CL 105 104 109 109  CO2 15*  --  17* 18*  GLUCOSE 302* 295* 154* 155*  BUN 11 14 20  21*  CREATININE 2.32* 2.00* 2.15* 2.25*  CALCIUM 8.4*  --  7.3* 6.6*  MG  --   --  1.6*  --   PHOS  --   --  3.9  --    GFR: Estimated Creatinine Clearance: 46.2 mL/min (A) (by C-G formula based on SCr of 2.25 mg/dL (H)). Recent Labs  Lab 07/04/2018 1759 06/28/2018 2125 06/06/18 0455  WBC 17.7*  --  3.2*  LATICACIDVEN  --  11.28*  --     Liver Function Tests: Recent Labs  Lab 06/17/2018 1759 06/06/18 0357  AST 1,035* 636*  ALT 1,101* 614*  ALKPHOS 93 59  BILITOT 1.2 0.8  PROT 5.8* 4.4*  ALBUMIN 3.3* 2.3*   No results for input(s): LIPASE, AMYLASE in the last 168 hours. No results for input(s): AMMONIA in the last 168 hours.  ABG    Component Value Date/Time   PHART 7.140 (LL) 06/06/2018 0733   PCO2ART 53.7 (H) 06/06/2018 0733   PO2ART 57.0 (L) 06/06/2018 0733   HCO3 18.5 (L) 06/06/2018 0733   TCO2 20 (L) 06/06/2018 0733   ACIDBASEDEF 11.0 (H) 06/06/2018 4401  O2SAT 81.0 06/06/2018 0733     Coagulation Profile: No results for input(s): INR, PROTIME in the last 168 hours.  Cardiac Enzymes: Recent Labs  Lab 06/06/18 0357  TROPONINI 3.65*    HbA1C: Hgb A1c MFr Bld  Date/Time Value Ref Range Status  01/26/2010 06:40 AM  <5.7 % Final   5.4 (NOTE)                                                                       According to the ADA Clinical Practice Recommendations for 2011, when HbA1c is used as a screening test:   >=6.5%   Diagnostic of Diabetes Mellitus           (if abnormal result  is confirmed)  5.7-6.4%   Increased risk of developing Diabetes Mellitus  References:Diagnosis and Classification of Diabetes Mellitus,Diabetes Care,2011,34(Suppl 1):S62-S69  and Standards of Medical Care in         Diabetes - 2011,Diabetes Care,2011,34  (Suppl 1):S11-S61.    CBG: Recent Labs  Lab 2019/03/01 1756  GLUCAP 267*    Review of Systems:     Past Medical History  He,  has a past medical history of ADD (attention deficit disorder), Anxiety, Depression, and Seasonal allergies.   Surgical History   History reviewed. No pertinent surgical history.   Social History   reports that he has never smoked. His smokeless tobacco use includes chew and snuff. He reports current alcohol use. He reports current drug use. Drug: Cocaine.   Family History   His family history includes Cancer in an other family member; Diabetes in an other family member.   Allergies Allergies  Allergen Reactions  . Iohexol Hives and Itching     Code: HIVES, Desc: became itchy after injection, Onset Date: [id number]      Home Medications  Prior to Admission medications   Medication Sig Start Date End Date Taking? Authorizing Provider  ciprofloxacin (CIPRO) 500 MG tablet Take 1 tablet (500 mg total) by mouth 2 (two) times daily. Patient not taking: Reported on 01/02/2018 12/08/16   Lavera GuiseLiu, Dana Duo, MD  HYDROcodone-acetaminophen (NORCO/VICODIN) 5-325 MG tablet Take 1-2 tablets by mouth every 6 (six) hours as needed for moderate pain or severe pain. Patient not taking: Reported on 01/02/2018 12/08/16   Lavera GuiseLiu, Dana Duo, MD  metroNIDAZOLE (FLAGYL) 500 MG tablet Take 1 tablet (500 mg total) by mouth 2 (two) times daily. Patient not taking: Reported on 01/02/2018 12/08/16   Lavera GuiseLiu, Dana Duo, MD  ondansetron (ZOFRAN) 4 MG tablet Take 1 tablet (4 mg total) by mouth every 8 (eight) hours as needed for nausea or vomiting. Patient not taking: Reported on 01/02/2018 12/08/16   Lavera GuiseLiu, Dana Duo, MD     Critical care time:     Arnetha CourserSumayya Oluwatoni Rotunno MD PGY3 Pager [phone]

## 2018-06-06 NOTE — Progress Notes (Signed)
Pt continues to require large amounts of air in ET tube cuff, but still with cuff leak. ET tube has been repositioned several times since admission, but leak continues. RT will continue to monitor.

## 2018-06-06 NOTE — Progress Notes (Signed)
Neurology paged about Na 143 during shift change, page was not returned. PM nurse made aware.

## 2018-06-06 NOTE — Progress Notes (Addendum)
[facility]. Elink RN said to call MD directly for orders. Attempted to call twice w/ busy line each time. Will continue to attempt to call.   Results: PH- 7.193 PCO2- 52.5 PO2- 73.0 Bicarb- 19.5 O2 saturation- 86.0  *Called immediately after note was made- MD answered.

## 2018-06-06 NOTE — Progress Notes (Signed)
  Chaplain responded to request by family for prayer.  Mother Dewayne Hatch), boyfriend Jonny Ruiz) and sister Victorino Dike, 66) returned from leaving the unit briefly.  Mother is in acute emotional distress, with ongoing bouts of crying and pleading with son and in prayer for patient to get better. Sister is also teary, but appearing to try to support and comfort mother.  Boyfriend has been in the picture for about 3 months, appears distressed and committed to being present for patient's mother.  Sister noted that she and patient have one other sister by a different mother, but they have not yet advised this half-sister of the present medical crisis.  Chaplain provided prayer, emotional support and hospitality to the family.    Please contact as needed for ongoing spiritual support.   [phone]   06/06/18 0000  Clinical Encounter Type  Visited With Patient and family together  Visit Type Initial;Critical Care  Referral From Family  Consult/Referral To Chaplain  Spiritual Encounters  Spiritual Needs Prayer  Stress Factors  Patient Stress Factors Health changes  Family Stress Factors Major life changes;Loss of control

## 2018-06-07 ENCOUNTER — Inpatient Hospital Stay (HOSPITAL_COMMUNITY): Payer: Medicaid Other

## 2018-06-07 ENCOUNTER — Other Ambulatory Visit (HOSPITAL_COMMUNITY): Payer: Self-pay

## 2018-06-07 DIAGNOSIS — R579 Shock, unspecified: Secondary | ICD-10-CM

## 2018-06-07 DIAGNOSIS — G931 Anoxic brain damage, not elsewhere classified: Secondary | ICD-10-CM

## 2018-06-07 LAB — CBC
HCT: 47.3 % (ref 39.0–52.0)
Hemoglobin: 16.1 g/dL (ref 13.0–17.0)
MCH: 31.1 pg (ref 26.0–34.0)
MCHC: 34 g/dL (ref 30.0–36.0)
MCV: 91.3 fL (ref 80.0–100.0)
Platelets: 236 10*3/uL (ref 150–400)
RBC: 5.18 MIL/uL (ref 4.22–5.81)
RDW: 13 % (ref 11.5–15.5)
WBC: 21.3 10*3/uL — ABNORMAL HIGH (ref 4.0–10.5)
nRBC: 0 % (ref 0.0–0.2)

## 2018-06-07 LAB — BASIC METABOLIC PANEL
ANION GAP: 13 (ref 5–15)
Anion gap: 8 (ref 5–15)
Anion gap: 11 (ref 5–15)
BUN: 15 mg/dL (ref 6–20)
BUN: 18 mg/dL (ref 6–20)
BUN: 15 mg/dL (ref 6–20)
CHLORIDE: 122 mmol/L — AB (ref 98–111)
CO2: 19 mmol/L — ABNORMAL LOW (ref 22–32)
CO2: 18 mmol/L — ABNORMAL LOW (ref 22–32)
CO2: 14 mmol/L — ABNORMAL LOW (ref 22–32)
Calcium: 7 mg/dL — ABNORMAL LOW (ref 8.9–10.3)
Calcium: 7.3 mg/dL — ABNORMAL LOW (ref 8.9–10.3)
Calcium: 7.7 mg/dL — ABNORMAL LOW (ref 8.9–10.3)
Chloride: 119 mmol/L — ABNORMAL HIGH (ref 98–111)
Chloride: 126 mmol/L — ABNORMAL HIGH (ref 98–111)
Creatinine, Ser: 1.7 mg/dL — ABNORMAL HIGH (ref 0.61–1.24)
Creatinine, Ser: 1.59 mg/dL — ABNORMAL HIGH (ref 0.61–1.24)
Creatinine, Ser: 1.43 mg/dL — ABNORMAL HIGH (ref 0.61–1.24)
GFR calc Af Amer: >60 mL/min (ref >60)
GFR calc non Af Amer: 55 mL/min — ABNORMAL LOW (ref >60)
GFR calc non Af Amer: 59 mL/min — ABNORMAL LOW (ref >60)
GLUCOSE: 137 mg/dL — AB (ref 70–99)
GLUCOSE: 115 mg/dL — AB (ref 70–99)
Glucose, Bld: 122 mg/dL — ABNORMAL HIGH (ref 70–99)
Potassium: 3.8 mmol/L (ref 3.5–5.1)
Potassium: 3.9 mmol/L (ref 3.5–5.1)
Potassium: 3.7 mmol/L (ref 3.5–5.1)
SODIUM: 146 mmol/L — AB (ref 135–145)
Sodium: 151 mmol/L — ABNORMAL HIGH (ref 135–145)
Sodium: 153 mmol/L — ABNORMAL HIGH (ref 135–145)

## 2018-06-07 LAB — HEPATIC FUNCTION PANEL
ALT: 353 U/L — ABNORMAL HIGH (ref 0–44)
AST: 102 U/L — ABNORMAL HIGH (ref 15–41)
Albumin: 1.9 g/dL — ABNORMAL LOW (ref 3.5–5.0)
Alkaline Phosphatase: 48 U/L (ref 38–126)
BILIRUBIN INDIRECT: 0.6 mg/dL (ref 0.3–0.9)
Bilirubin, Direct: 0.2 mg/dL (ref 0.0–0.2)
Total Bilirubin: 0.8 mg/dL (ref 0.3–1.2)
Total Protein: 4.4 g/dL — ABNORMAL LOW (ref 6.5–8.1)

## 2018-06-07 LAB — SODIUM
SODIUM: 151 mmol/L — AB (ref 135–145)
SODIUM: 156 mmol/L — AB (ref 135–145)

## 2018-06-07 LAB — LACTIC ACID, PLASMA
Lactic Acid, Venous: 8.8 mmol/L (ref 0.5–1.9)
Lactic Acid, Venous: 8.8 mmol/L (ref 0.5–1.9)

## 2018-06-07 LAB — MAGNESIUM: MAGNESIUM: 1.7 mg/dL (ref 1.7–2.4)

## 2018-06-07 NOTE — Progress Notes (Signed)
NAME:  Alan Hess, MRN:  [id number], DOB:  1991/04/03, LOS: 2 ADMISSION DATE:  07/04/2018, CONSULTATION DATE:  06/13/2018 REFERRING MD: ED provider, CHIEF COMPLAINT: Cardiac arrest  Brief History   28 year old male with history of polysubstance abuse coming in post cardiac arrest.  History of present illness   [name] is a 28 year old male with PMHx of ADD, anxiety, polysubstance abuse and depression presented to the emergency room after cardiac arrest.  Patient was found cyanotic by his family members for unknown time, apparently mother and sister tried CPR with mouth-to-mouth breathing for 10 to 15 minutes until EMS arrives, when EMS arrived patient was in PEA started CPR for around 16-minute had one round of epinephrine and Narcan finally had a return of circulation.  Patient was intubated and brought into the emergency room.  Patient remained obtunded and unresponsive without any sedation.  He was found to have a positive UDS for amphetamine, benzo, opioids, cocaine and marijuana. PCCM was consulted for further management.  Past Medical History  -Anxiety -Polysubstance abuse -ADD  Significant Hospital Events   -Intubated 1/2  Consults:  Neurology.  Procedures:  ET tube. 06/21/2018 Central line.06/10/2018 Arterial line. 06/04/2018  Significant Diagnostic Tests:  CT head 06/11/2018 Findings consistent with mild diffuse cerebral edema with diffuse sulcal effacement. Questionable loss of gray-white differentiation in the occipital lobes versus motion artifact.  CT Head.06/06/18.  Shows severe global anoxic injury with extensive infarcts in the supra and infratentorial brain.  There was elevated intracranial pressure with generalized subarachnoid space effacement consistent with pseudo-subarachnoid sign.  TTE 06/06/18 EF15-20%. Diffuse hypokinesis  CXR 06/07/18 Worsening bilateral interstitial and airspace opacities   Micro Data:  Blood Cx.06/23/2018>> NGTD Respiratory Cx. 07/02/2018>>  NGTD  Antimicrobials:  Unasyn 1/2>>2/2 Cefepime.1/3>> Vancomycin.1/3>>  Interim history/subjective:  Tmax 101.8, currently afebrile. Pressor requirement reduced with IVFs. Family discussion yesterday - patient with severe anoxic brain injury family wants patient remain full code and continue current medical management. Please refer to plan of care for full details.  Objective   Blood pressure (!) 139/109, pulse (!) 113, temperature 97.7 F (36.5 C), resp. rate (!) 9, height 5\' 10"  (1.778 m), weight 66.8 kg, SpO2 100 %.    Vent Mode: PRVC FiO2 (%):  [90 %-100 %] 90 % Set Rate:  [30 bmp] 30 bmp Vt Set:  [480 mL] 480 mL PEEP:  [12 cmH20] 12 cmH20 Plateau Pressure:  [24 cmH20-28 cmH20] 27 cmH20   Intake/Output Summary (Last 24 hours) at 06/07/2018 0703 Last data filed at 06/07/2018 0700 Gross per 24 hour  Intake 5795.59 ml  Output 2700 ml  Net 3095.59 ml   Filed Weights   06/06/18 0402 06/07/18 0500  Weight: 65.1 kg 66.8 kg    Examination: General: Critically ill appearing male, laying in bed, intubated Lungs: Clear to auscultation bilaterally. No rhonchi, wheezing or crackles. Cardiovascular: Tachycardia, no MGR Abdomen: BS+, soft, non-tender Extremities: No edema, pedal pulses palpable Neuro: Unresponsive, not following commands GU: Foley in place  Resolved Hospital Problem list     Assessment & Plan:  S/p cardiac arrest with severe anoxic brain injury with extensive infarct Patient arrested at home, most likely secondary to polysubstance abuse. Troponin peaked 3.65. Unresponsive without any sedation. Repeat CT head with cerebral edema. Repeat CT head obtained for worsening neuro exam on 1/3 which shows severe anoxic brain injury with extensive supra and infratentorial infarct involving brainstem and cerebral edema with increased intracranial pressure. Dismal prognosis with low likelihood of neurological recovery. Will  continue goals of care discussions when able. -Continue  ventilator support -Neurology following. Appreciate recs -Continue 3% hypertonic saline with goal Na 150-155 -BMP q6h -Frequent neuro checks  Undifferentiated Shock -S/p IVF resusitation -Continue Levophed and Vasopressin -Cefepime and Vancomycin -Continue trending lactic acid and troponin -Continue stress dose steroids -Trend LA -Follow up cultures   Acute hypoxic respiratory failure requiring mechanical ventilation. ARDS Secondary to cardiac arrest and anoxic brain injury.  S/p ETT exchange on 1/3 from 6.5 to 7.5 ETT to improve Peak/Plateau pressures. CXR today with persistent bilateral infiltrates -Continue full support vent with low tidal volumes -Wean protocol however no plan for SBT due to neuro status  Respiratory and metabolic acidosis Permissible hypercapnea -BMP daily, monitor UOP  Hypomagnesemia.  Magnesium at 1.6. -Repeat electrolyte and keep monitoring.  Transaminitis.  Most likely due to shock liver secondary to hypoxemia with cardiac arrest.  AST and ALT improving -Keep monitoring CMP daily.  Best practice:  Diet: NPO Pain/Anxiety/Delirium protocol (if indicated): NA VAP protocol (if indicated):  DVT prophylaxis: Heparin and SCDs GI prophylaxis: Protonix Glucose control:  Mobility: Bedrest Code Status: Continue Full code Family Communication: Updated mother at bedside Disposition: Remain in ICU  Labs   CBC: Recent Labs  Lab 06/25/2018 1759 07/02/2018 1807 06/06/18 0455 06/07/18 0535  WBC 17.7*  --  3.2* 21.3*  NEUTROABS 14.3*  --   --   --   HGB 14.7 15.3 18.1* 16.1  HCT 47.2 45.0 54.7* 47.3  MCV 99.8  --  91.8 91.3  PLT 518*  --  350 236    Basic Metabolic Panel: Recent Labs  Lab 06/06/18 0357 06/06/18 0908 06/06/18 1027 06/06/18 1400 06/06/18 1638 06/07/18 0035 06/07/18 0535  NA 141 139 141 142 143 146* 151*  K 4.0 3.5 3.5  --  3.3* 3.8  --   CL 109 109 112*  --  115* 119*  --   CO2 17* 18* 16*  --  15* 19*  --   GLUCOSE 154* 155*  149*  --  174* 137*  --   BUN 20 21* 20  --  17 15  --   CREATININE 2.15* 2.25* 2.19*  --  1.83* 1.70*  --   CALCIUM 7.3* 6.6* 6.2*  --  6.7* 7.0*  --   MG 1.6*  --   --   --   --   --   --   PHOS 3.9  --   --   --   --   --   --    GFR: Estimated Creatinine Clearance: 62.8 mL/min (A) (by C-G formula based on SCr of 1.7 mg/dL (H)). Recent Labs  Lab 06/19/2018 1759  06/06/18 0455 06/06/18 1027 06/06/18 1400 06/06/18 1740 06/06/18 2100 06/07/18 0535  WBC 17.7*  --  3.2*  --   --   --   --  21.3*  LATICACIDVEN  --    < >  --  6.8* 7.3* 8.1* 6.6*  --    < > = values in this interval not displayed.    Liver Function Tests: Recent Labs  Lab 06/14/2018 1759 06/06/18 0357 06/07/18 0535  AST 1,035* 636* 102*  ALT 1,101* 614* 353*  ALKPHOS 93 59 48  BILITOT 1.2 0.8 0.8  PROT 5.8* 4.4* 4.4*  ALBUMIN 3.3* 2.3* 1.9*   No results for input(s): LIPASE, AMYLASE in the last 168 hours. No results for input(s): AMMONIA in the last 168 hours.  ABG    Component Value  Date/Time   PHART 7.217 (L) 06/06/2018 1513   PCO2ART 49.2 (H) 06/06/2018 1513   PO2ART 86.0 06/06/2018 1513   HCO3 19.6 (L) 06/06/2018 1513   TCO2 21 (L) 06/06/2018 1513   ACIDBASEDEF 8.0 (H) 06/06/2018 1513   O2SAT 92.0 06/06/2018 1513     Coagulation Profile: No results for input(s): INR, PROTIME in the last 168 hours.  Cardiac Enzymes: Recent Labs  Lab 06/06/18 0357 06/06/18 1027 06/06/18 1400 06/06/18 1538 06/06/18 2130  TROPONINI 3.65* 1.84* 1.60* 1.51* 1.43*    HbA1C: Hgb A1c MFr Bld  Date/Time Value Ref Range Status  01/26/2010 06:40 AM  <5.7 % Final   5.4 (NOTE)                                                                       According to the ADA Clinical Practice Recommendations for 2011, when HbA1c is used as a screening test:   >=6.5%   Diagnostic of Diabetes Mellitus           (if abnormal result  is confirmed)  5.7-6.4%   Increased risk of developing Diabetes Mellitus  References:Diagnosis  and Classification of Diabetes Mellitus,Diabetes Care,2011,34(Suppl 1):S62-S69 and Standards of Medical Care in         Diabetes - 2011,Diabetes Care,2011,34  (Suppl 1):S11-S61.    CBG: Recent Labs  Lab 06/11/2018 1756  GLUCAP 267*    Review of Systems:     Past Medical History  He,  has a past medical history of ADD (attention deficit disorder), Anxiety, Depression, and Seasonal allergies.   Surgical History   History reviewed. No pertinent surgical history.   Social History   reports that he has never smoked. His smokeless tobacco use includes chew and snuff. He reports current alcohol use. He reports current drug use. Drug: Cocaine.   Family History   His family history includes Cancer in an other family member; Diabetes in an other family member.   Allergies Allergies  Allergen Reactions  . Iohexol Hives and Itching     Code: HIVES, Desc: became itchy after injection, Onset Date: [id number]      Home Medications  Prior to Admission medications   Medication Sig Start Date End Date Taking? Authorizing Provider  ciprofloxacin (CIPRO) 500 MG tablet Take 1 tablet (500 mg total) by mouth 2 (two) times daily. Patient not taking: Reported on 01/02/2018 12/08/16   Lavera GuiseLiu, Dana Duo, MD  HYDROcodone-acetaminophen (NORCO/VICODIN) 5-325 MG tablet Take 1-2 tablets by mouth every 6 (six) hours as needed for moderate pain or severe pain. Patient not taking: Reported on 01/02/2018 12/08/16   Lavera GuiseLiu, Dana Duo, MD  metroNIDAZOLE (FLAGYL) 500 MG tablet Take 1 tablet (500 mg total) by mouth 2 (two) times daily. Patient not taking: Reported on 01/02/2018 12/08/16   Lavera GuiseLiu, Dana Duo, MD  ondansetron (ZOFRAN) 4 MG tablet Take 1 tablet (4 mg total) by mouth every 8 (eight) hours as needed for nausea or vomiting. Patient not taking: Reported on 01/02/2018 12/08/16   Lavera GuiseLiu, Dana Duo, MD     Critical care time:  The patient is critically ill with multiple organ systems failure and requires high complexity decision making  for assessment and support, frequent evaluation and titration of therapies, application of  advanced monitoring technologies and extensive interpretation of multiple databases.   Critical Care Time devoted to patient care services described in this note is  35 Minutes. This time reflects time of care of this signee Dr. Mechele Collin. This critical care time does not reflect procedure time, or teaching time or supervisory time of PA/NP/Med student/Med Resident etc but could involve care discussion time.  Mechele Collin, M.D. [facility] Pulmonary/Critical Care Medicine. Pager: [phone]. After hours pager: [phone].

## 2018-06-07 NOTE — Progress Notes (Signed)
Dr. Wilford Corner notified of MRI completion. Orders to check Na levels at 1800 and to notify CCM for management of 3% saline with goal Na 145-155.

## 2018-06-07 NOTE — Progress Notes (Signed)
1750 Na 153, MD Icard made aware. Will not restart 3% at this time. Send repeat Na at 0000 1/5 as planned and re-evaluate. Norva Karvonen, RN

## 2018-06-07 NOTE — Progress Notes (Signed)
Neurology Progress Note   S:// Patient seen and examined at bedside this morning. Mother at bedside. Patient had a bone pupil on the right yesterday.  Taken for repeat CT scan that showed extensive cerebral edema along with multifocal infarcts as well as a pseudo-subarachnoid pattern consistent with diffuse cerebral edema and herniation.  O:// Current vital signs: BP (!) 139/109   Pulse (!) 113   Temp 97.7 F (36.5 C)   Resp (!) 9   Ht 5' 10"  (1.778 m)   Wt 66.8 kg   SpO2 100%   BMI 21.13 kg/m  Vital signs in last 24 hours: Temp:  [97.7 F (36.5 C)-101.8 F (38.8 C)] 97.7 F (36.5 C) (01/04 0700) Pulse Rate:  [113-141] 113 (01/04 0700) Resp:  [0-30] 9 (01/04 0700) BP: (101-166)/(68-125) 139/109 (01/04 0700) SpO2:  [88 %-100 %] 100 % (01/04 0744) Arterial Line BP: (89-142)/(64-104) 136/96 (01/04 0700) FiO2 (%):  [70 %-100 %] 70 % (01/04 0744) Weight:  [66.8 kg] 66.8 kg (01/04 0500) General: Patient is intubated, no sedation. HEENT: Normocephalic atraumatic Lungs: Clear to auscultation-vented Cardiovascular: S1-S2 heard regular rate rhythm Extremities: Warm well perfused with no edema Abdomen: Soft nondistended nontender Neurological exam Patient is intubated, on no sedation. No response to verbal or noxious stimulation. Does not open eyes Nonverbal Cranial nerves: Pupils are 5 mm, nonreactive bilaterally, no corneal reflexes, no oculocephalic reflex, no cough no gag, breathing with the ventilator. Motor exam: No spontaneous movement.  No movement to deep noxious stimulation in any of the extremities. Sensory exam: As above Coordination and gait cannot be tested Toes are mute  Medications  Current Facility-Administered Medications:  .  0.9 %  sodium chloride infusion, 250 mL, Intravenous, Continuous, Mohan, Kinila T, MD, Last Rate: 10 mL/hr at 06/07/18 0700 .  ceFEPIme (MAXIPIME) 2 g in sodium chloride 0.9 % 100 mL IVPB, 2 g, Intravenous, Q24H, Britt Boozer,  RPH, Last Rate: 200 mL/hr at 06/06/18 1329, 2 g at 06/06/18 1329 .  chlorhexidine gluconate (MEDLINE KIT) (PERIDEX) 0.12 % solution 15 mL, 15 mL, Mouth Rinse, BID, Aljishi, Virgina Norfolk, MD, 15 mL at 06/07/18 0734 .  fentaNYL (SUBLIMAZE) injection 100 mcg, 100 mcg, Intravenous, Q15 min PRN, Desai, Rahul P, PA-C .  fentaNYL (SUBLIMAZE) injection 100 mcg, 100 mcg, Intravenous, Q2H PRN, Desai, Rahul P, PA-C .  heparin injection 5,000 Units, 5,000 Units, Subcutaneous, Q8H, Desai, Rahul P, PA-C, 5,000 Units at 06/07/18 0528 .  hydrocortisone sodium succinate (SOLU-CORTEF) 100 MG injection 50 mg, 50 mg, Intravenous, Q6H, Cecilie Lowers T, MD, 50 mg at 06/07/18 6294 .  MEDLINE mouth rinse, 15 mL, Mouth Rinse, 10 times per day, Aldean Jewett, MD, 15 mL at 06/07/18 0528 .  midazolam (VERSED) injection 2 mg, 2 mg, Intravenous, Q15 min PRN, Desai, Rahul P, PA-C .  midazolam (VERSED) injection 2 mg, 2 mg, Intravenous, Q2H PRN, Desai, Rahul P, PA-C .  norepinephrine (LEVOPHED) 16 mg in D5W 233m premix infusion, 0-150 mcg/min, Intravenous, Titrated, EMargaretha Seeds MD, Last Rate: 16.88 mL/hr at 06/07/18 0700, 18 mcg/min at 06/07/18 0700 .  pantoprazole (PROTONIX) injection 40 mg, 40 mg, Intravenous, Q24H, Amin, Sumayya, MD, 40 mg at 06/06/18 1503 .  sodium chloride (hypertonic) 3 % solution, , Intravenous, Continuous, LKerney Elbe MD, Last Rate: 125 mL/hr at 06/07/18 0700 .  vancomycin (VANCOCIN) IVPB 1000 mg/200 mL premix, 1,000 mg, Intravenous, Q24H, FBritt Boozer RPH .  vasopressin (PITRESSIN) 40 Units in sodium chloride 0.9 % 250 mL (0.16  Units/mL) infusion, 0.03 Units/min, Intravenous, Continuous, Mohan, Kinila T, MD, Last Rate: 11.25 mL/hr at 06/07/18 0700, 0.03 Units/min at 06/07/18 0700 Labs CBC    Component Value Date/Time   WBC 21.3 (H) 06/07/2018 0535   RBC 5.18 06/07/2018 0535   HGB 16.1 06/07/2018 0535   HCT 47.3 06/07/2018 0535   PLT 236 06/07/2018 0535   MCV 91.3 06/07/2018 0535   MCH  31.1 06/07/2018 0535   MCHC 34.0 06/07/2018 0535   RDW 13.0 06/07/2018 0535   LYMPHSABS 1.6 06/28/2018 1759   MONOABS 1.1 (H) 06/22/2018 1759   EOSABS 0.4 06/28/2018 1759   BASOSABS 0.0 06/07/2018 1759    CMP     Component Value Date/Time   NA 151 (H) 06/07/2018 0535   K 3.8 06/07/2018 0035   CL 119 (H) 06/07/2018 0035   CO2 19 (L) 06/07/2018 0035   GLUCOSE 137 (H) 06/07/2018 0035   BUN 15 06/07/2018 0035   CREATININE 1.70 (H) 06/07/2018 0035   CALCIUM 7.0 (L) 06/07/2018 0035   PROT 4.4 (L) 06/07/2018 0535   ALBUMIN 1.9 (L) 06/07/2018 0535   AST 102 (H) 06/07/2018 0535   ALT 353 (H) 06/07/2018 0535   ALKPHOS 48 06/07/2018 0535   BILITOT 0.8 06/07/2018 0535   GFRNONAA 55 (L) 06/07/2018 0035   GFRAA >60 06/07/2018 0035    Imaging I have reviewed images in epic and the results pertinent to this consultation are: Initial CT scan concerning for anoxic brain injury with diffuse cerebral edema. Repeat CT scan yesterday demonstrating severe global anoxic injury with extensive infarcts in the supra and infratentorial brain with elevated intracranial pressure that is evident by generalized subarachnoid space effacement and a pseudo-subarachnoid pattern.  Assessment:  28 year old man presented to the hospital following cardiac arrest. Urinary toxicology screen positive for amphetamines, benzodiazepines, opiates, cocaine and THC. Clinical exam today reveals no evidence of brainstem function. CT scan yesterday demonstrates severe global anoxic injury with extensive infarcts in the supra and infratentorial brain with elevated intracranial pressure. Given the presentation with cardiac arrest, polysubstance abuse, absence of brainstem reflexes and CT scan findings, I would predict that he would progress to brain death if he has not already. Family-mother is very emotional at this time and unable to grasp details of the exam findings and imaging findings at this time. I have spoken with her  in detail and attempted to provide her with as much information as I could based on the exam and imaging.  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  Recommendations: I would recommend doing an MRI of the brain as an ancillary test to evaluate for the extent of the global anoxia as well as infarcts. If the continuing exam shows absent brainstem reflexes, I think he would have progressed to brain death at that time. If he has any trickle of brainstem reflexes, going forward, prognostically, any chances of neurologically meaningful recovery are slim if that. I have communicated my plan to the critical care attending, Dr. Loanne Drilling, on the unit. I will follow-up the imaging.  -- Amie Portland, MD [facility] Pager: [phone] If 7pm to 7am, please call on call as listed on AMION.  CRITICAL CARE ATTESTATION Performed by: Amie Portland, MD Total critical care time: 40 minutes Critical care time was exclusive of separately billable procedures and treating other patients and/or supervising APPs/Residents/Students Critical care was necessary to treat or prevent imminent or life-threatening deterioration due to anoxic brain injury  This patient  is critically ill and at significant risk for neurological worsening and/or death and care requires constant monitoring. Critical care was time spent personally by me on the following activities: development of treatment plan with patient and/or surrogate as well as nursing, discussions with consultants, evaluation of patient's response to treatment, examination of patient, obtaining history from patient or surrogate, ordering and performing treatments and interventions, ordering and review of laboratory studies, ordering and review of radiographic studies, pulse oximetry, re-evaluation of patient's condition, participation in multidisciplinary rounds and medical decision making of high  complexity in the care of this patient.

## 2018-06-07 NOTE — Progress Notes (Addendum)
Paged on-call Neurologist about pt's Na+ 146 with a goal 150-155.  Waiting for response.  Will continue to monitor pt.    Verbal orders to increase Hypertonic solution to 115mL/hr.

## 2018-06-08 ENCOUNTER — Inpatient Hospital Stay (HOSPITAL_COMMUNITY): Payer: Medicaid Other

## 2018-06-08 DIAGNOSIS — R4189 Other symptoms and signs involving cognitive functions and awareness: Secondary | ICD-10-CM

## 2018-06-08 DIAGNOSIS — G9382 Brain death: Secondary | ICD-10-CM

## 2018-06-08 DIAGNOSIS — R6521 Severe sepsis with septic shock: Secondary | ICD-10-CM

## 2018-06-08 DIAGNOSIS — A419 Sepsis, unspecified organism: Secondary | ICD-10-CM

## 2018-06-08 LAB — POCT I-STAT 3, ART BLOOD GAS (G3+)
ACID-BASE EXCESS: 1 mmol/L (ref 0.0–2.0)
Acid-base deficit: 1 mmol/L (ref 0.0–2.0)
Acid-base deficit: 2 mmol/L (ref 0.0–2.0)
BICARBONATE: 20.3 mmol/L (ref 20.0–28.0)
Bicarbonate: 24 mmol/L (ref 20.0–28.0)
Bicarbonate: 26.3 mmol/L (ref 20.0–28.0)
O2 Saturation: 100 %
O2 Saturation: 100 %
O2 Saturation: 100 %
Patient temperature: 36.9
Patient temperature: 37
Patient temperature: 37
TCO2: 25 mmol/L (ref 22–32)
TCO2: 28 mmol/L (ref 22–32)
TCO2: 21 mmol/L — ABNORMAL LOW (ref 22–32)
pCO2 arterial: 29.7 mmHg — ABNORMAL LOW (ref 32.0–48.0)
pCO2 arterial: 56.3 mmHg — ABNORMAL HIGH (ref 32.0–48.0)
pCO2 arterial: 23.7 mmHg — ABNORMAL LOW (ref 32.0–48.0)
pH, Arterial: 7.277 — ABNORMAL LOW (ref 7.350–7.450)
pH, Arterial: 7.514 — ABNORMAL HIGH (ref 7.350–7.450)
pH, Arterial: 7.539 — ABNORMAL HIGH (ref 7.350–7.450)
pO2, Arterial: 470 mmHg — ABNORMAL HIGH (ref 83.0–108.0)
pO2, Arterial: 498 mmHg — ABNORMAL HIGH (ref 83.0–108.0)
pO2, Arterial: 143 mmHg — ABNORMAL HIGH (ref 83.0–108.0)

## 2018-06-08 LAB — PROTIME-INR
INR: 1.39
Prothrombin Time: 16.9 seconds — ABNORMAL HIGH (ref 11.4–15.2)

## 2018-06-08 LAB — URINALYSIS, COMPLETE (UACMP) WITH MICROSCOPIC
Glucose, UA: NEGATIVE mg/dL
Ketones, ur: 20 mg/dL — AB
Leukocytes, UA: NEGATIVE
Nitrite: NEGATIVE
Protein, ur: 100 mg/dL — AB
Specific Gravity, Urine: 1.04 — ABNORMAL HIGH (ref 1.005–1.030)
pH: 6 (ref 5.0–8.0)

## 2018-06-08 LAB — CBC
HEMATOCRIT: 35.9 % — AB (ref 39.0–52.0)
Hemoglobin: 12.4 g/dL — ABNORMAL LOW (ref 13.0–17.0)
MCH: 31.6 pg (ref 26.0–34.0)
MCHC: 34.5 g/dL (ref 30.0–36.0)
MCV: 91.3 fL (ref 80.0–100.0)
Platelets: 190 10*3/uL (ref 150–400)
RBC: 3.93 MIL/uL — ABNORMAL LOW (ref 4.22–5.81)
RDW: 13.5 % (ref 11.5–15.5)
WBC: 27.5 10*3/uL — ABNORMAL HIGH (ref 4.0–10.5)
nRBC: 0.1 % (ref 0.0–0.2)

## 2018-06-08 LAB — COMPREHENSIVE METABOLIC PANEL
ALT: 165 U/L — ABNORMAL HIGH (ref 0–44)
AST: 33 U/L (ref 15–41)
Albumin: 1.8 g/dL — ABNORMAL LOW (ref 3.5–5.0)
Alkaline Phosphatase: 65 U/L (ref 38–126)
Anion gap: 13 (ref 5–15)
BUN: 23 mg/dL — ABNORMAL HIGH (ref 6–20)
CALCIUM: 8.3 mg/dL — AB (ref 8.9–10.3)
CO2: 17 mmol/L — ABNORMAL LOW (ref 22–32)
Chloride: 123 mmol/L — ABNORMAL HIGH (ref 98–111)
Creatinine, Ser: 1.23 mg/dL (ref 0.61–1.24)
GFR calc Af Amer: >60 mL/min (ref >60)
GFR calc non Af Amer: >60 mL/min (ref >60)
Glucose, Bld: 141 mg/dL — ABNORMAL HIGH (ref 70–99)
Potassium: 3.1 mmol/L — ABNORMAL LOW (ref 3.5–5.1)
Sodium: 153 mmol/L — ABNORMAL HIGH (ref 135–145)
TOTAL PROTEIN: 4.5 g/dL — AB (ref 6.5–8.1)
Total Bilirubin: 1.7 mg/dL — ABNORMAL HIGH (ref 0.3–1.2)

## 2018-06-08 LAB — CULTURE, RESPIRATORY W GRAM STAIN: Culture: NORMAL

## 2018-06-08 LAB — HEMOGLOBIN A1C
Hgb A1c MFr Bld: 5.3 % (ref 4.8–5.6)
Mean Plasma Glucose: 105.41 mg/dL

## 2018-06-08 LAB — GLUCOSE, CAPILLARY: Glucose-Capillary: 130 mg/dL — ABNORMAL HIGH (ref 70–99)

## 2018-06-08 LAB — BASIC METABOLIC PANEL
Anion gap: 10 (ref 5–15)
BUN: 19 mg/dL (ref 6–20)
CO2: 17 mmol/L — ABNORMAL LOW (ref 22–32)
Calcium: 8 mg/dL — ABNORMAL LOW (ref 8.9–10.3)
Chloride: 126 mmol/L — ABNORMAL HIGH (ref 98–111)
Creatinine, Ser: 1.4 mg/dL — ABNORMAL HIGH (ref 0.61–1.24)
GFR calc Af Amer: >60 mL/min (ref >60)
GFR calc non Af Amer: >60 mL/min (ref >60)
Glucose, Bld: 113 mg/dL — ABNORMAL HIGH (ref 70–99)
POTASSIUM: 3.8 mmol/L (ref 3.5–5.1)
Sodium: 153 mmol/L — ABNORMAL HIGH (ref 135–145)

## 2018-06-08 LAB — LIPASE, BLOOD: Lipase: 22 U/L (ref 11–51)

## 2018-06-08 LAB — LACTATE DEHYDROGENASE: LDH: 230 U/L — ABNORMAL HIGH (ref 98–192)

## 2018-06-08 LAB — FIBRINOGEN: Fibrinogen: 765 mg/dL — ABNORMAL HIGH (ref 210–475)

## 2018-06-08 LAB — CK TOTAL AND CKMB (NOT AT ARMC)
CK TOTAL: 113 U/L (ref 49–397)
CK, MB: 2.3 ng/mL (ref 0.5–5.0)
Relative Index: 2 (ref 0.0–2.5)

## 2018-06-08 LAB — BILIRUBIN, DIRECT: Bilirubin, Direct: 0.3 mg/dL — ABNORMAL HIGH (ref 0.0–0.2)

## 2018-06-08 LAB — MAGNESIUM: Magnesium: 2 mg/dL (ref 1.7–2.4)

## 2018-06-08 LAB — TROPONIN I: Troponin I: 0.17 ng/mL (ref <0.03)

## 2018-06-08 LAB — AMYLASE: Amylase: 29 U/L (ref 28–100)

## 2018-06-08 LAB — PHOSPHORUS: Phosphorus: <1 mg/dL — CL (ref 2.5–4.6)

## 2018-06-08 LAB — SODIUM
SODIUM: 154 mmol/L — AB (ref 135–145)
Sodium: 154 mmol/L — ABNORMAL HIGH (ref 135–145)

## 2018-06-08 MED ORDER — SODIUM CHLORIDE 0.45 % IV SOLN
INTRAVENOUS | Status: DC
  Administered 2018-06-08 – 2018-06-09 (×5): via INTRAVENOUS

## 2018-06-08 MED ORDER — VANCOMYCIN HCL 10 G IV SOLR
1250.0000 mg | INTRAVENOUS | Status: DC
  Administered 2018-06-09: 1250 mg via INTRAVENOUS
  Filled 2018-06-08 (×2): qty 1250

## 2018-06-08 MED ORDER — SODIUM CHLORIDE 0.9 % IV SOLN
1000.0000 mg | Freq: Once | INTRAVENOUS | Status: AC
  Administered 2018-06-08: 1000 mg via INTRAVENOUS
  Filled 2018-06-08: qty 8

## 2018-06-08 MED ORDER — TECHNETIUM TC 99M EXAMETAZIME IV KIT
20.5000 | PACK | Freq: Once | INTRAVENOUS | Status: AC | PRN
  Administered 2018-06-08: 20.5 via INTRAVENOUS

## 2018-06-08 MED ORDER — MAGNESIUM SULFATE IN D5W 1-5 GM/100ML-% IV SOLN
1.0000 g | Freq: Once | INTRAVENOUS | Status: AC
  Administered 2018-06-09: 1 g via INTRAVENOUS
  Filled 2018-06-08: qty 100

## 2018-06-08 MED ORDER — POTASSIUM PHOSPHATES 15 MMOLE/5ML IV SOLN
30.0000 mmol | Freq: Once | INTRAVENOUS | Status: AC
  Administered 2018-06-09: 30 mmol via INTRAVENOUS
  Filled 2018-06-08: qty 10

## 2018-06-08 MED ORDER — SODIUM CHLORIDE 0.9 % IV SOLN
2.0000 g | Freq: Two times a day (BID) | INTRAVENOUS | Status: DC
  Administered 2018-06-08 – 2018-06-09 (×2): 2 g via INTRAVENOUS
  Filled 2018-06-08 (×4): qty 2

## 2018-06-08 NOTE — Progress Notes (Signed)
Patient had brain perfusion scan by nuclear medicine today which shows no perfusion and it was consistent with brain death. We did an apnea test at 15:21 PM which shows an increase in CO2 of 26.7, it was also consistent with brain death. Patient was pronounced brain dead at that point.  Later had a family meeting with multiple family members.  Dr. Jerrell Belfast from neurology explained the imaging and testing to them and pronounce his brain death.  Dr. Everardo All and other team members were also present at that time.  Family wants to pursue organ donation.

## 2018-06-08 NOTE — Progress Notes (Signed)
The patient was transported for a perfusion test and back without incident.

## 2018-06-08 NOTE — Progress Notes (Signed)
Sputum culture collected, sent to lab.  

## 2018-06-08 NOTE — Progress Notes (Signed)
eLink Physician-Brief Progress Note Patient Name: Alan Hess DOB: Dec 24, 1990 MRN: [id number]   Date of Service  2018-06-10  HPI/Events of Note  K+ = 3.1, Mg++ = 2.0, PO4--- < 1.0 and Creatinine = 1.23.   eICU Interventions  Will order: 1. Replace K+, Mg++ and PO4-- 2. Repeat BMP and PO4--- level at 7 AM.     Intervention Category Major Interventions: Electrolyte abnormality - evaluation and management  Fredericka Bottcher Eugene 06-10-18, 11:53 PM

## 2018-06-08 NOTE — Progress Notes (Addendum)
Neurology Progress Note   S:// Seen and examined. No change in exam No breaths over the vent No brainstem function. Has been off of sedation  O:// Current vital signs: BP 108/74 (BP Location: Right Arm)   Pulse 98   Temp 99.3 F (37.4 C) (Core)   Resp (!) 30   Ht _0  (1.778 m)   Wt 63.7 kg   SpO2 99%   BMI 20.15 kg/m  Vital signs in last 24 hours: Temp:  [96.6 F (35.9 C)-99.5 F (37.5 C)] 99.3 F (37.4 C) (01/05 0700) Pulse Rate:  [93-109] 98 (01/05 0700) Resp:  [12-30] 30 (01/05 0700) BP: (86-154)/(63-110) 108/74 (01/05 0700) SpO2:  [98 %-100 %] 99 % (01/05 0732) Arterial Line BP: (96-163)/(59-109) 112/67 (01/05 0700) FiO2 (%):  [30 %-60 %] 30 % (01/05 0732) Weight:  [63.7 kg] 63.7 kg (01/05 0300) Gen: Intubated, no sedation HEENT - NCAT CVS- s1s2 heard, rrr Resp: vented Abd: benign NEUROLOGICAL No spontaneous movement. No response to voice No response to nox stim Pupils 59m fixed, dilated. Corneal reflex absent OCR absent  Breathing with the vent No withdrawal to nox stim No DTRs  Medications  Current Facility-Administered Medications:  .  0.9 %  sodium chloride infusion, 250 mL, Intravenous, Continuous, Mohan, Kinila T, MD, Last Rate: 20 mL/hr at 06/15/2018 0700 .  ceFEPIme (MAXIPIME) 2 g in sodium chloride 0.9 % 100 mL IVPB, 2 g, Intravenous, Q24H, FBritt Boozer [facility] Stopped at 06/07/18 1159 .  chlorhexidine gluconate (MEDLINE KIT) (PERIDEX) 0.12 % solution 15 mL, 15 mL, Mouth Rinse, BID, Aljishi, WVirgina Norfolk MD, 15 mL at 06/04/2018 0727 .  fentaNYL (SUBLIMAZE) injection 100 mcg, 100 mcg, Intravenous, Q15 min PRN, Desai, Rahul P, PA-C .  fentaNYL (SUBLIMAZE) injection 100 mcg, 100 mcg, Intravenous, Q2H PRN, Desai, Rahul P, PA-C .  heparin injection 5,000 Units, 5,000 Units, Subcutaneous, Q8H, Desai, Rahul P, PA-C, 5,000 Units at 07/01/2018 0617 .  hydrocortisone sodium succinate (SOLU-CORTEF) 100 MG injection 50 mg, 50 mg, Intravenous, Q6H, MCecilie Lowers T, MD, 50 mg at 06/06/2018 0618 .  MEDLINE mouth rinse, 15 mL, Mouth Rinse, 10 times per day, AAldean Jewett MD, 15 mL at 06/29/2018 0618 .  midazolam (VERSED) injection 2 mg, 2 mg, Intravenous, Q15 min PRN, Desai, Rahul P, PA-C .  midazolam (VERSED) injection 2 mg, 2 mg, Intravenous, Q2H PRN, Desai, Rahul P, PA-C .  norepinephrine (LEVOPHED) 16 mg in D5W 2534mpremix infusion, 0-150 mcg/min, Intravenous, Titrated, ElMargaretha SeedsMD, Last Rate: 16.88 mL/hr at 06/06/2018 0700, 18 mcg/min at 06/18/2018 0700 .  pantoprazole (PROTONIX) injection 40 mg, 40 mg, Intravenous, Q24H, Amin, SuSoundra PilonMD, 40 mg at 06/07/18 1412 .  sodium chloride (hypertonic) 3 % solution, , Intravenous, Continuous, LiKerney ElbeMD, Stopped at 06/07/18 1405 .  vancomycin (VANCOCIN) IVPB 1000 mg/200 mL premix, 1,000 mg, Intravenous, Q24H, FaBritt BoozerRPUnicoi [facility] HospitalStopped at 06/07/18 1355 .  vasopressin (PITRESSIN) 40 Units in sodium chloride 0.9 % 250 mL (0.16 Units/mL) infusion, 0.03 Units/min, Intravenous, Continuous, Mohan, Kinila T, MD, Last Rate: 11.25 mL/hr at 07/03/2018 0700, 0.03 Units/min at 07/01/2018 0700 Labs CBC    Component Value Date/Time   WBC 21.3 (H) 06/07/2018 0535   RBC 5.18 06/07/2018 0535   HGB 16.1 06/07/2018 0535   HCT 47.3 06/07/2018 0535   PLT 236 06/07/2018 0535   MCV 91.3 06/07/2018 0535   MCH 31.1 06/07/2018 0535   MCHC 34.0 06/07/2018 0535   RDW 13.0 06/07/2018 0535  LYMPHSABS 1.6 06/14/2018 1759   MONOABS 1.1 (H) 06/24/2018 1759   EOSABS 0.4 07/02/2018 1759   BASOSABS 0.0 06/06/2018 1759    CMP     Component Value Date/Time   NA 154 (H) 06/17/2018 0615   K 3.8 06/26/2018 0028   CL 126 (H) 06/28/2018 0028   CO2 17 (L) 06/06/2018 0028   GLUCOSE 113 (H) 06/21/2018 0028   BUN 19 06/15/2018 0028   CREATININE 1.40 (H) 06/24/2018 0028   CALCIUM 8.0 (L) 06/04/2018 0028   PROT 4.4 (L) 06/07/2018 0535   ALBUMIN 1.9 (L) 06/07/2018 0535   AST 102 (H) 06/07/2018 0535   ALT 353 (H)  06/07/2018 0535   ALKPHOS 48 06/07/2018 0535   BILITOT 0.8 06/07/2018 0535   GFRNONAA >60 06/29/2018 0028   GFRAA >60 06/13/2018 0028    Imaging I have reviewed images in epic and the results pertinent to this consultation are: MRI brain with severe anoxic brain damage evident by diffuse cerebral edema, reduced diffusion, mass effect with efacement of basilar cistern and 27m cerebellar tonsillar herniation. Diffuse vascular congestion over convexities  Assessment:  25/M who presented s/p cardiac arrest with Utox positive for amphetamines, benzos, opiates, cocaine and THC, now with clinical exam consistent with brain death due to absence of brainstem reflexes as well as MRI brain showing changes suggestive of severe anoxic brain injury and cerebellar tonsillar herniation. Recommended a nuclear medicine brain scan that showed no uptake/evidence of flow-confirming clinical suspicion of brain death.  Recommendations: Clinical exam as well as nuclear medicine scan consistent with brain death. After my clinical exam and prior to receiving the results of the nuclear medicine scan, I attempted to speak with the mother at the bedside.  She was sleeping in the chair bed in the room and asked me to come back at a later point.  I checked with the nurse to see if the mother was back-she was not back to the room yet. I have spoken and relayed my assessment to the primary-PCCM attending Dr. ELoanne Drilling who will be performing an apnea test. It is expected that we will speak with the patient's mother and inform her of the clinical brain death exam as well as the ancillary testing confirming brain death. I will update the note as things progress.  -- AAmie Portland MD [facility] Pager: [phone] 7pm to 7am, please call on call as listed on AMION.  CRITICAL CARE ATTESTATION Performed by: AAmie Portland MD Total critical care time: 30 minutes Critical care time was exclusive of separately  billable procedures and treating other patients and/or supervising APPs/Residents/Students Critical care was necessary to treat or prevent imminent or life-threatening deterioration due to severe anoxic brain injury, brain herniation. This patient is critically ill and at significant risk for neurological worsening and/or death and care requires constant monitoring. Critical care was time spent personally by me on the following activities: development of treatment plan with patient and/or surrogate as well as nursing, discussions with consultants, evaluation of patient's response to treatment, examination of patient, obtaining history from patient or surrogate, ordering and performing treatments and interventions, ordering and review of laboratory studies, ordering and review of radiographic studies, pulse oximetry, re-evaluation of patient's condition, participation in multidisciplinary rounds and medical decision making of high complexity in the care of this patient.     Addendum 1750 hrs Had a family meeting.  Explained to the family the clinical exam, imaging findings including the MRI of the nuclear brain scan with no  flow to the brain and exam consistent with brain death. Family indicated that he had wanted to be an organ donor.  [place] [facility] was available to talk to the family afterwards. The family was very tearful and appreciative of the care provided. Extubation per primary team Neurology will be available as needed -- Amie Portland, MD [facility] Pager: [phone] If 7pm to 7am, please call on call as listed on AMION.

## 2018-06-08 NOTE — Progress Notes (Signed)
Pharmacy Antibiotic Note  Alan Hess is a 28 y.o. male admitted on 06/27/2018 with aspiration pneumonia, now with sepsis. Pharmacy has been consulted for Vancomycin and cefepime dosing. Patients WBC up to 27.5. Patients Tmax today is 99.5, and SCr improved at 1.4. Blood cultures currently remain negative.  Plan: Increase vancomycin slightly to 1250 mg q 24 hrs. Increase cefepime to 2g q 12 hrs. Will monitor renal function and cultures and sensitivities. F/u plans for care.   Height: 5\' 10"  (177.8 cm) Weight: 140 lb 6.9 oz (63.7 kg) IBW/kg (Calculated) : 73  Temp (24hrs), Avg:98 F (36.7 C), Min:96.6 F (35.9 C), Max:99.5 F (37.5 C)  Recent Labs  Lab 2018-06-13 1759  06/06/18 0455  06/06/18 1400 06/06/18 1638 06/06/18 1740 06/06/18 2100 06/07/18 0035 06/07/18 0535 06/07/18 0738 06/07/18 0817 06/07/18 1144 06/07/18 1750 06/21/2018 0028 07/04/2018 1100  WBC 17.7*  --  3.2*  --   --   --   --   --   --  21.3*  --   --   --   --   --  27.5*  CREATININE 2.32*   < >  --    < >  --  1.83*  --   --  1.70*  --   --  1.59*  --  1.43* 1.40*  --   LATICACIDVEN  --    < >  --    < > 7.3*  --  8.1* 6.6*  --   --  8.8*  --  8.8*  --   --   --    < > = values in this interval not displayed.    Estimated Creatinine Clearance: 72.7 mL/min (A) (by C-G formula based on SCr of 1.4 mg/dL (H)).    Allergies  Allergen Reactions  . Iohexol Hives and Itching     Code: HIVES, Desc: became itchy after injection, Onset Date: [id number]    Microbiology: 01/02 Bcx: NG < 12 hr 01/02 Trach aspirate: sent 01/02 MRSA PCR: negative Ucx: sent  Antimicrobials this admission: Vancomycin 01/03 >> Cefepime 01/03 >> Unasyn 01/02 >> 1/2  Thank you for allowing pharmacy to be a part of this patient's care.  Jenetta DownerJessica Sheyna Pettibone, Pharm D, BCPS, [facility] Clinical Pharmacist Phone [phone]  06/13/2018 2:18 PM

## 2018-06-08 NOTE — Progress Notes (Signed)
Bronchoscopy for organ donation was performed by Dr. Everardo All at 6:30 PM. It was normal, without any structural abnormalities or abnormal secretions.

## 2018-06-08 NOTE — Progress Notes (Signed)
NAME:  Alan Hess, MRN:  [id number], DOB:  [birth year], LOS: 3 ADMISSION DATE:  06/10/2018, CONSULTATION DATE:  06/12/2018 REFERRING MD: ED provider, CHIEF COMPLAINT: Cardiac arrest  Brief History   28 year old male with history of polysubstance abuse coming in post cardiac arrest.  History of present illness   [name] is a 28 year old male with PMHx of ADD, anxiety, polysubstance abuse and depression presented to the emergency room after cardiac arrest.  Patient was found cyanotic by his family members for unknown time, apparently mother and sister tried CPR with mouth-to-mouth breathing for 10 to 15 minutes until EMS arrives, when EMS arrived patient was in PEA started CPR for around 16-minute had one round of epinephrine and Narcan finally had a return of circulation.  Patient was intubated and brought into the emergency room.  Patient remained obtunded and unresponsive without any sedation.  He was found to have a positive UDS for amphetamine, benzo, opioids, cocaine and marijuana. PCCM was consulted for further management.  Past Medical History  -Anxiety -Polysubstance abuse -ADD  Significant Hospital Events   -Intubated 1/2  Consults:  Neurology.  Procedures:  ET tube. 06/17/2018 Central line.06/04/2018 Arterial line. 06/13/2018  Significant Diagnostic Tests:  CT head 06/17/2018 Findings consistent with mild diffuse cerebral edema with diffuse sulcal effacement. Questionable loss of gray-white differentiation in the occipital lobes versus motion artifact.  CT Head.06/06/18.  Shows severe global anoxic injury with extensive infarcts in the supra and infratentorial brain.  There was elevated intracranial pressure with generalized subarachnoid space effacement consistent with pseudo-subarachnoid sign.  TTE 06/06/18 EF15-20%. Diffuse hypokinesis  CXR 06/07/18 Worsening bilateral interstitial and airspace opacities  MRI Brain 1/4.  Shows severe anoxic brain injury with brain  herniation.  Nuclear brain vascular scan. 1/5.  Absence of perfusion compatible with clinical diagnosis of brain death.   Micro Data:  Blood Cx.06/12/2018>> NGTD Respiratory Cx. 06/04/2018>> NGTD  Antimicrobials:  Unasyn 1/2>>2/2 Cefepime.1/3>> Vancomycin.1/3>>  Interim history/subjective:  Remained completely unresponsive with dilated and fixed pupils and no corneal reflexes.  Remained afebrile over the last 24 hours.  MRI brain done yesterday and a nuclear perfusion scan done today are consistent with brain death.  Will need a family meeting.  Objective   Blood pressure 112/74, pulse 92, temperature 98.4 F (36.9 C), resp. rate (!) 0, height 5\' 10"  (1.778 m), weight 63.7 kg, SpO2 99 %.    Vent Mode: PRVC FiO2 (%):  [30 %-50 %] 30 % Set Rate:  [30 bmp] 30 bmp Vt Set:  [480 mL] 480 mL PEEP:  [12 cmH20] 12 cmH20 Plateau Pressure:  [24 cmH20-25 cmH20] 25 cmH20   Intake/Output Summary (Last 24 hours) at 06/30/2018 1540 Last data filed at 06/21/2018 1400 Gross per 24 hour  Intake 1372.67 ml  Output 920 ml  Net 452.67 ml   Filed Weights   06/06/18 0402 06/07/18 0500 2018-07-05 0300  Weight: 65.1 kg 66.8 kg 63.7 kg    Examination: General: Critically ill appearing male, laying in bed, intubated Lungs: Clear to auscultation bilaterally. No rhonchi, wheezing or crackles. 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 Troponin peaked 3.65. Unresponsive without any sedation. Repeat CT head with cerebral edema. Repeat CT head obtained  for worsening neuro exam on 1/3 which shows severe anoxic brain injury with extensive supra and infratentorial infarct involving brainstem  and cerebral edema with increased intracranial pressure. Dismal prognosis with low likelihood of neurological recovery.  His MRI and perfusion nuclear brain scan are consistent with brain death. -Apnea test was performed which shows elevation in CO2 by 26.7. -Continue ventilator support-till will have a family meeting. -Neurology following. Appreciate recs -BMP q6h -Frequent neuro checks  Undifferentiated Shock -S/p IVF resusitation -Continue Levophed and Vasopressin -Cefepime and Vancomycin -Continue trending lactic acid and troponin -Continue stress dose steroids -Follow up cultures -remain negative  Acute hypoxic respiratory failure requiring mechanical ventilation. ARDS Secondary to cardiac arrest and anoxic brain injury.  S/p ETT exchange on 1/3 from 6.5 to 7.5 ETT to improve Peak/Plateau pressures. CXR today with persistent bilateral infiltrates -Continue full support vent with low tidal volumes -Wean protocol however no plan for SBT due to neuro status  Respiratory and metabolic acidosis Permissible hypercapnea -BMP daily, monitor UOP  Hypomagnesemia.  Magnesium at 1.6. -Repeat electrolyte and keep monitoring.  Transaminitis.  Most likely due to shock liver secondary to hypoxemia with cardiac arrest.  AST and ALT improving -Keep monitoring CMP daily.  Patient is clinically brain dead.  Family meeting later today for further disposition.  Best practice:  Diet: NPO Pain/Anxiety/Delirium protocol (if indicated): NA VAP protocol (if indicated):  DVT prophylaxis: Heparin and SCDs GI prophylaxis: Protonix Glucose control:  Mobility: Bedrest Code Status: Continue Full code Family Communication: Updated mother at bedside Disposition: Remain in ICU  Labs   CBC: Recent Labs  Lab 07/02/2018 1759 06/25/2018 1807 06/06/18 0455 06/07/18 0535 Jul 07, 2018 1100  WBC 17.7*  --  3.2* 21.3* 27.5*  NEUTROABS 14.3*  --   --   --   --   HGB 14.7 15.3 18.1* 16.1 12.4*  HCT 47.2  45.0 54.7* 47.3 35.9*  MCV 99.8  --  91.8 91.3 91.3  PLT 518*  --  350 236 190    Basic Metabolic Panel: Recent Labs  Lab 06/06/18 0357  06/06/18 1638 06/07/18 0035  06/07/18 0817 06/07/18 1200 06/07/18 1750 Jul 07, 2018 0028 Jul 07, 2018 0615 07/07/2018 1015  NA 141   < > 143 146*   < > 151* 156* 153* 153* 154* 154*  K 4.0   < > 3.3* 3.8  --  3.9  --  3.7 3.8  --   --   CL 109   < > 115* 119*  --  122*  --  126* 126*  --   --   CO2 17*   < > 15* 19*  --  18*  --  14* 17*  --   --   GLUCOSE 154*   < > 174* 137*  --  115*  --  122* 113*  --   --   BUN 20   < > 17 15  --  15  --  18 19  --   --   CREATININE 2.15*   < > 1.83* 1.70*  --  1.59*  --  1.43* 1.40*  --   --   CALCIUM 7.3*   < > 6.7* 7.0*  --  7.3*  --  7.7* 8.0*  --   --   MG 1.6*  --   --   --   --  1.7  --   --   --   --   --   PHOS 3.9  --   --   --   --   --   --   --   --   --   --    < > =  values in this interval not displayed.   GFR: Estimated Creatinine Clearance: 72.7 mL/min (A) (by C-G formula based on SCr of 1.4 mg/dL (H)). Recent Labs  Lab 06/20/2018 1759  06/06/18 0455  06/06/18 1740 06/06/18 2100 06/07/18 0535 06/07/18 0738 06/07/18 1144 06/12/2018 1100  WBC 17.7*  --  3.2*  --   --   --  21.3*  --   --  27.5*  LATICACIDVEN  --    < >  --    < > 8.1* 6.6*  --  8.8* 8.8*  --    < > = values in this interval not displayed.    Liver Function Tests: Recent Labs  Lab 07/02/2018 1759 06/06/18 0357 06/07/18 0535  AST 1,035* 636* 102*  ALT 1,101* 614* 353*  ALKPHOS 93 59 48  BILITOT 1.2 0.8 0.8  PROT 5.8* 4.4* 4.4*  ALBUMIN 3.3* 2.3* 1.9*   No results for input(s): LIPASE, AMYLASE in the last 168 hours. No results for input(s): AMMONIA in the last 168 hours.  ABG    Component Value Date/Time   PHART 7.277 (L) 06/19/2018 1521   PCO2ART 56.3 (H) 06/20/2018 1521   PO2ART 470.0 (H) 06/18/2018 1521   HCO3 26.3 06/22/2018 1521   TCO2 28 06/16/2018 1521   ACIDBASEDEF 1.0 06/23/2018 1521   O2SAT 100.0  06/20/2018 1521     Coagulation Profile: No results for input(s): INR, PROTIME in the last 168 hours.  Cardiac Enzymes: Recent Labs  Lab 06/06/18 0357 06/06/18 1027 06/06/18 1400 06/06/18 1538 06/06/18 2130  TROPONINI 3.65* 1.84* 1.60* 1.51* 1.43*    HbA1C: Hgb A1c MFr Bld  Date/Time Value Ref Range Status  01/26/2010 06:40 AM  <5.7 % Final   5.4 (NOTE)                                                                       According to the ADA Clinical Practice Recommendations for 2011, when HbA1c is used as a screening test:   >=6.5%   Diagnostic of Diabetes Mellitus           (if abnormal result  is confirmed)  5.7-6.4%   Increased risk of developing Diabetes Mellitus  References:Diagnosis and Classification of Diabetes Mellitus,Diabetes Care,2011,34(Suppl 1):S62-S69 and Standards of Medical Care in         Diabetes - 2011,Diabetes Care,2011,34  (Suppl 1):S11-S61.    CBG: Recent Labs  Lab 06/29/2018 1756  GLUCAP 267*    Review of Systems:     Past Medical History  He,  has a past medical history of ADD (attention deficit disorder), Anxiety, Depression, and Seasonal allergies.   Surgical History   History reviewed. No pertinent surgical history.   Social History   reports that he has never smoked. His smokeless tobacco use includes chew and snuff. He reports current alcohol use. He reports current drug use. Drug: Cocaine.   Family History   His family history includes Cancer in an other family member; Diabetes in an other family member.   Allergies Allergies  Allergen Reactions  . Iohexol Hives and Itching     Code: HIVES, Desc: became itchy after injection, Onset Date: [id number]      Home Medications  Prior to Admission medications  Medication Sig Start Date End Date Taking? Authorizing Provider  ciprofloxacin (CIPRO) 500 MG tablet Take 1 tablet (500 mg total) by mouth 2 (two) times daily. Patient not taking: Reported on 01/02/2018 12/08/16   Lavera GuiseLiu, Dana Duo, MD   HYDROcodone-acetaminophen (NORCO/VICODIN) 5-325 MG tablet Take 1-2 tablets by mouth every 6 (six) hours as needed for moderate pain or severe pain. Patient not taking: Reported on 01/02/2018 12/08/16   Lavera GuiseLiu, Dana Duo, MD  metroNIDAZOLE (FLAGYL) 500 MG tablet Take 1 tablet (500 mg total) by mouth 2 (two) times daily. Patient not taking: Reported on 01/02/2018 12/08/16   Lavera GuiseLiu, Dana Duo, MD  ondansetron (ZOFRAN) 4 MG tablet Take 1 tablet (4 mg total) by mouth every 8 (eight) hours as needed for nausea or vomiting. Patient not taking: Reported on 01/02/2018 12/08/16   Lavera GuiseLiu, Dana Duo, MD     Critical care time:  The patient is critically ill with multiple organ systems failure and requires high complexity decision making for assessment and support, frequent evaluation and titration of therapies, application of advanced monitoring technologies and extensive interpretation of multiple databases.   Critical Care Time devoted to patient care services described in this note is  35 Minutes. This time reflects time of care of this signee Dr. Mechele CollinJane Ellison. This critical care time does not reflect procedure time, or teaching time or supervisory time of PA/NP/Med student/Med Resident etc but could involve care discussion time.  Mechele CollinJane Ellison, M.D. [facility]. Pager: [phone]. After hours pager: [phone].

## 2018-06-08 NOTE — Progress Notes (Signed)
Apnea test performed with Dr. Mechele Collin and the patient's RN at the patient's bedside. The patient was placed on 8lpm o2 down the ETT for 8 minutes. The patient had an increase in CO2 from 29.7 to 56.3.

## 2018-06-08 NOTE — Procedures (Signed)
Bronchoscopy Procedure Note Prudencio BurlySamuel L Cushman [id number] 09-Aug-1990  Procedure: Bronchoscopy Indications: Evaluation for Organ Donation  Procedure Details Consent: Risks of procedure as well as the alternatives and risks of each were explained to the (patient/caregiver).  Consent for procedure obtained. Time Out: Verified patient identification, verified procedure, site/side was marked, verified correct patient position, special equipment/implants available, medications/allergies/relevent history reviewed, required imaging and test results available.  Performed  In preparation for procedure, bronchoscope lubricated. Sedation: None  Airway entered and the following bronchi were examined: RUL, RML, RLL, LUL and LLL. Minimal non-purulent clear secretions present that were easily suctioned. Procedures performed: Diagnostic bronchoscopy Bronchoscope removed.    Evaluation Hemodynamic Status: BP stable throughout; O2 sats: stable throughout Patient's Current Condition: stable Specimens:  None Complications: No apparent complications Patient did tolerate procedure well.   Jenavive Lamboy Mechele CollinJane Ziona Wickens 07/01/2018

## 2018-06-09 ENCOUNTER — Other Ambulatory Visit (HOSPITAL_COMMUNITY): Payer: Medicaid Other

## 2018-06-09 DIAGNOSIS — Z529 Donor of unspecified organ or tissue: Secondary | ICD-10-CM

## 2018-06-09 DIAGNOSIS — Z5289 Donor of other specified organs or tissues: Secondary | ICD-10-CM

## 2018-06-09 LAB — POCT I-STAT 3, ART BLOOD GAS (G3+)
Acid-Base Excess: 2 mmol/L (ref 0.0–2.0)
Acid-Base Excess: 1 mmol/L (ref 0.0–2.0)
Acid-base deficit: 1 mmol/L (ref 0.0–2.0)
Acid-base deficit: 1 mmol/L (ref 0.0–2.0)
Acid-base deficit: 2 mmol/L (ref 0.0–2.0)
Acid-base deficit: 2 mmol/L (ref 0.0–2.0)
Acid-base deficit: 8 mmol/L — ABNORMAL HIGH (ref 0.0–2.0)
Acid-base deficit: 1 mmol/L (ref 0.0–2.0)
Acid-base deficit: 2 mmol/L (ref 0.0–2.0)
Bicarbonate: 20.6 mmol/L (ref 20.0–28.0)
Bicarbonate: 20.2 mmol/L (ref 20.0–28.0)
Bicarbonate: 21.1 mmol/L (ref 20.0–28.0)
Bicarbonate: 24.3 mmol/L (ref 20.0–28.0)
Bicarbonate: 21.2 mmol/L (ref 20.0–28.0)
Bicarbonate: 15.5 mmol/L — ABNORMAL LOW (ref 20.0–28.0)
Bicarbonate: 24.1 mmol/L (ref 20.0–28.0)
Bicarbonate: 22.3 mmol/L (ref 20.0–28.0)
Bicarbonate: 21.8 mmol/L (ref 20.0–28.0)
O2 SAT: 100 %
O2 Saturation: 100 %
O2 Saturation: 99 %
O2 Saturation: 100 %
O2 Saturation: 98 %
O2 Saturation: 99 %
O2 Saturation: 100 %
O2 Saturation: 99 %
O2 Saturation: 99 %
PCO2 ART: 21.7 mmHg — AB (ref 32.0–48.0)
PCO2 ART: 21.9 mmHg — AB (ref 32.0–48.0)
PH ART: 7.428 (ref 7.350–7.450)
PH ART: 7.57 — AB (ref 7.350–7.450)
PH ART: 7.586 — AB (ref 7.350–7.450)
PO2 ART: 133 mmHg — AB (ref 83.0–108.0)
PO2 ART: 116 mmHg — AB (ref 83.0–108.0)
PO2 ART: 134 mmHg — AB (ref 83.0–108.0)
Patient temperature: 35.3
Patient temperature: 35.2
Patient temperature: 35.2
Patient temperature: 36.1
Patient temperature: 37.2
Patient temperature: 37.3
Patient temperature: 38.4
Patient temperature: 36.7
TCO2: 21 mmol/L — ABNORMAL LOW (ref 22–32)
TCO2: 21 mmol/L — ABNORMAL LOW (ref 22–32)
TCO2: 22 mmol/L (ref 22–32)
TCO2: 25 mmol/L (ref 22–32)
TCO2: 22 mmol/L (ref 22–32)
TCO2: 16 mmol/L — ABNORMAL LOW (ref 22–32)
TCO2: 25 mmol/L (ref 22–32)
TCO2: 23 mmol/L (ref 22–32)
TCO2: 23 mmol/L (ref 22–32)
pCO2 arterial: 21.3 mmHg — ABNORMAL LOW (ref 32.0–48.0)
pCO2 arterial: 27.6 mmHg — ABNORMAL LOW (ref 32.0–48.0)
pCO2 arterial: 31.6 mmHg — ABNORMAL LOW (ref 32.0–48.0)
pCO2 arterial: 23.6 mmHg — ABNORMAL LOW (ref 32.0–48.0)
pCO2 arterial: 27.3 mmHg — ABNORMAL LOW (ref 32.0–48.0)
pCO2 arterial: 31.5 mmHg — ABNORMAL LOW (ref 32.0–48.0)
pCO2 arterial: 30.3 mmHg — ABNORMAL LOW (ref 32.0–48.0)
pH, Arterial: 7.586 — ABNORMAL HIGH (ref 7.350–7.450)
pH, Arterial: 7.55 — ABNORMAL HIGH (ref 7.350–7.450)
pH, Arterial: 7.436 (ref 7.350–7.450)
pH, Arterial: 7.551 — ABNORMAL HIGH (ref 7.350–7.450)
pH, Arterial: 7.463 — ABNORMAL HIGH (ref 7.350–7.450)
pH, Arterial: 7.464 — ABNORMAL HIGH (ref 7.350–7.450)
pO2, Arterial: 130 mmHg — ABNORMAL HIGH (ref 83.0–108.0)
pO2, Arterial: 106 mmHg (ref 83.0–108.0)
pO2, Arterial: 361 mmHg — ABNORMAL HIGH (ref 83.0–108.0)
pO2, Arterial: 107 mmHg (ref 83.0–108.0)
pO2, Arterial: 125 mmHg — ABNORMAL HIGH (ref 83.0–108.0)
pO2, Arterial: 457 mmHg — ABNORMAL HIGH (ref 83.0–108.0)

## 2018-06-09 LAB — COMPREHENSIVE METABOLIC PANEL
ALT: 116 U/L — AB (ref 0–44)
AST: 26 U/L (ref 15–41)
Albumin: 1.6 g/dL — ABNORMAL LOW (ref 3.5–5.0)
Alkaline Phosphatase: 69 U/L (ref 38–126)
Anion gap: 10 (ref 5–15)
BUN: 19 mg/dL (ref 6–20)
CO2: 20 mmol/L — ABNORMAL LOW (ref 22–32)
Calcium: 7.4 mg/dL — ABNORMAL LOW (ref 8.9–10.3)
Chloride: 124 mmol/L — ABNORMAL HIGH (ref 98–111)
Creatinine, Ser: 1.22 mg/dL (ref 0.61–1.24)
GFR calc Af Amer: >60 mL/min (ref >60)
GFR calc non Af Amer: >60 mL/min (ref >60)
Glucose, Bld: 128 mg/dL — ABNORMAL HIGH (ref 70–99)
Potassium: 2.9 mmol/L — ABNORMAL LOW (ref 3.5–5.1)
Sodium: 154 mmol/L — ABNORMAL HIGH (ref 135–145)
Total Bilirubin: 1.7 mg/dL — ABNORMAL HIGH (ref 0.3–1.2)
Total Protein: 4.1 g/dL — ABNORMAL LOW (ref 6.5–8.1)

## 2018-06-09 LAB — DIFFERENTIAL
BLASTS: 0 %
Band Neutrophils: 9 %
Basophils Absolute: 0 10*3/uL (ref 0.0–0.1)
Basophils Relative: 0 %
Eosinophils Absolute: 0 10*3/uL (ref 0.0–0.5)
Eosinophils Relative: 0 %
Lymphocytes Relative: 4 %
Lymphs Abs: 0.8 10*3/uL (ref 0.7–4.0)
MONOS PCT: 2 %
Metamyelocytes Relative: 1 %
Monocytes Absolute: 0.4 10*3/uL (ref 0.1–1.0)
Myelocytes: 1 %
Neutro Abs: 18.7 10*3/uL — ABNORMAL HIGH (ref 1.7–7.7)
Neutrophils Relative %: 83 %
Other: 0 %
Promyelocytes Relative: 0 %
nRBC: 0 /100 WBC

## 2018-06-09 LAB — ABO/RH
ABO/RH(D): A POS
ABO/RH(D): A POS
PT AG Type: POSITIVE

## 2018-06-09 LAB — CBC
HCT: 30.3 % — ABNORMAL LOW (ref 39.0–52.0)
Hemoglobin: 10.1 g/dL — ABNORMAL LOW (ref 13.0–17.0)
MCH: 30 pg (ref 26.0–34.0)
MCHC: 33.3 g/dL (ref 30.0–36.0)
MCV: 89.9 fL (ref 80.0–100.0)
NRBC: 0.2 % (ref 0.0–0.2)
Platelets: 123 10*3/uL — ABNORMAL LOW (ref 150–400)
RBC: 3.37 MIL/uL — ABNORMAL LOW (ref 4.22–5.81)
RDW: 13.2 % (ref 11.5–15.5)
WBC: 19.9 10*3/uL — AB (ref 4.0–10.5)

## 2018-06-09 LAB — BASIC METABOLIC PANEL
Anion gap: 5 (ref 5–15)
BUN: 18 mg/dL (ref 6–20)
CO2: 20 mmol/L — ABNORMAL LOW (ref 22–32)
Calcium: 7.9 mg/dL — ABNORMAL LOW (ref 8.9–10.3)
Chloride: 129 mmol/L — ABNORMAL HIGH (ref 98–111)
Creatinine, Ser: 1.02 mg/dL (ref 0.61–1.24)
GFR calc Af Amer: >60 mL/min (ref >60)
GFR calc non Af Amer: >60 mL/min (ref >60)
Glucose, Bld: 152 mg/dL — ABNORMAL HIGH (ref 70–99)
Potassium: 2.9 mmol/L — ABNORMAL LOW (ref 3.5–5.1)
Sodium: 154 mmol/L — ABNORMAL HIGH (ref 135–145)

## 2018-06-09 LAB — URINALYSIS, ROUTINE W REFLEX MICROSCOPIC
Bilirubin Urine: NEGATIVE
Glucose, UA: NEGATIVE mg/dL
Ketones, ur: 5 mg/dL — AB
Leukocytes, UA: NEGATIVE
Nitrite: NEGATIVE
Protein, ur: 100 mg/dL — AB
SPECIFIC GRAVITY, URINE: 1.033 — AB (ref 1.005–1.030)
pH: 6 (ref 5.0–8.0)

## 2018-06-09 LAB — PHOSPHORUS: Phosphorus: 2 mg/dL — ABNORMAL LOW (ref 2.5–4.6)

## 2018-06-09 LAB — GLUCOSE, CAPILLARY
GLUCOSE-CAPILLARY: 138 mg/dL — AB (ref 70–99)
GLUCOSE-CAPILLARY: 203 mg/dL — AB (ref 70–99)

## 2018-06-09 MED ORDER — PHENYLEPHRINE HCL-NACL 40-0.9 MG/250ML-% IV SOLN
30.0000 ug/min | INTRAVENOUS | Status: DC
  Administered 2018-06-09: 30 ug/min via INTRAVENOUS
  Filled 2018-06-09: qty 250

## 2018-06-09 MED ORDER — SODIUM CHLORIDE 0.45 % IV BOLUS
500.0000 mL | Freq: Once | INTRAVENOUS | Status: AC
  Administered 2018-06-09: 500 mL via INTRAVENOUS

## 2018-06-09 MED ORDER — SODIUM CHLORIDE 0.9 % IV SOLN
10.0000 ug/h | INTRAVENOUS | Status: DC
  Administered 2018-06-09: 20 ug/h via INTRAVENOUS
  Administered 2018-06-09 (×2): 10 ug/h via INTRAVENOUS
  Filled 2018-06-09 (×2): qty 10

## 2018-06-09 MED ORDER — LEVOTHYROXINE SODIUM 100 MCG/5ML IV SOLN
20.0000 ug | Freq: Once | INTRAVENOUS | Status: AC
  Administered 2018-06-09: 20 ug via INTRAVENOUS
  Filled 2018-06-09: qty 5

## 2018-06-09 MED ORDER — SODIUM CHLORIDE 0.9 % IV SOLN
1000.0000 mg | Freq: Once | INTRAVENOUS | Status: AC
  Administered 2018-06-09: 1000 mg via INTRAVENOUS
  Filled 2018-06-09: qty 8

## 2018-06-09 MED ORDER — SODIUM CHLORIDE 0.9 % IV SOLN
INTRAVENOUS | Status: DC | PRN
  Administered 2018-06-09: 16:00:00 via INTRA_ARTERIAL

## 2018-06-09 MED ORDER — DEXTROSE 50 % IV SOLN
50.0000 mL | Freq: Once | INTRAVENOUS | Status: AC
  Administered 2018-06-09: 50 mL via INTRAVENOUS
  Filled 2018-06-09: qty 50

## 2018-06-09 MED ORDER — POTASSIUM CHLORIDE 10 MEQ/50ML IV SOLN
10.0000 meq | INTRAVENOUS | Status: AC
  Administered 2018-06-09 (×4): 10 meq via INTRAVENOUS
  Filled 2018-06-09 (×4): qty 50

## 2018-06-09 MED ORDER — DOPAMINE-DEXTROSE 3.2-5 MG/ML-% IV SOLN
2.0000 ug/kg/min | INTRAVENOUS | Status: DC
  Administered 2018-06-09 (×2): 2 ug/kg/min via INTRAVENOUS
  Filled 2018-06-09: qty 250

## 2018-06-09 MED ORDER — PERFLUTREN LIPID MICROSPHERE
1.0000 mL | INTRAVENOUS | Status: AC | PRN
  Administered 2018-06-09: 3 mL via INTRAVENOUS
  Filled 2018-06-09: qty 10

## 2018-06-09 MED ORDER — PERFLUTREN LIPID MICROSPHERE
INTRAVENOUS | Status: AC
  Administered 2018-06-09: 3 mL via INTRAVENOUS
  Filled 2018-06-09: qty 10

## 2018-06-09 MED ORDER — INSULIN ASPART 100 UNIT/ML ~~LOC~~ SOLN
20.0000 [IU] | Freq: Once | SUBCUTANEOUS | Status: AC
  Administered 2018-06-09: 20 [IU] via SUBCUTANEOUS

## 2018-06-09 NOTE — Progress Notes (Signed)
[facility] requested chaplain come and be with the patient and family as he has been matched up to be donor for at least 3 recipients of organs.  Chaplain was asked to provide scripture and prayer with family and staff.  Family had [employer] song Go Rest High on that [place]. Family saying see you later and preparing for patient to be transported to [facility] immediately. Offered compassion and support for family and for the staff.  One nurse particularly was personally struggling. Phebe Colla, Chaplain   06/20/2018 1800  Clinical Encounter Type  Visited With Patient and family together;Other (Comment) ([facility])  Visit Type Spiritual support;Other (Comment) (patient going to [facility] to donate organs to others)  Referral From Other (Comment) ( Donor Services)  Consult/Referral To Chaplain  Spiritual Encounters  Spiritual Needs Sacred text;Ritual;Other (Comment) (Celebration of his life and preparing for next step)  Stress Factors  Patient Stress Factors None identified  Family Stress Factors Exhausted

## 2018-06-09 NOTE — Procedures (Signed)
Arterial Catheter Insertion Procedure Note TYRAE SIEMION [id number] 1990/12/14  Procedure: Insertion of Arterial Catheter  Indications: Blood pressure monitoring and Frequent blood sampling  Procedure Details Consent: Unable to obtain consent because of altered level of consciousness. Time Out: Verified patient identification, verified procedure, site/side was marked, verified correct patient position, special equipment/implants available, medications/allergies/relevent history reviewed, required imaging and test results available.  Performed  Maximum sterile technique was used including antiseptics, cap, gloves, gown, hand hygiene, mask and sheet. Skin prep: Chlorhexidine; local anesthetic administered 22 gauge catheter was inserted into right radial artery using the Seldinger technique. ULTRASOUND GUIDANCE USED: NO Evaluation Blood flow good; BP tracing good. Complications: No apparent complications.   Armando Gang 

## 2018-06-09 NOTE — Progress Notes (Signed)
Recruitment done per CDS protocol. PSV 0, Peep 25, FiO2 100% x 40 sec. Pt tolerated recruitment, and was then placed back on previous settings. ABG to follow

## 2018-06-10 LAB — TYPE AND SCREEN
ABO/RH(D): A POS
Antibody Screen: NEGATIVE
PT AG Type: POSITIVE
Unit division: 0
Unit division: 0
Unit division: 0
Unit division: 0

## 2018-06-10 LAB — BPAM RBC
BLOOD PRODUCT EXPIRATION DATE: 202001282359
Blood Product Expiration Date: 202001312359
Blood Product Expiration Date: 202001312359
Blood Product Expiration Date: 202001312359
ISSUE DATE / TIME: 202001031444
ISSUE DATE / TIME: 202001031444
ISSUE DATE / TIME: 202001031328
UNIT TYPE AND RH: 6200
Unit Type and Rh: 6200
Unit Type and Rh: 6200
Unit Type and Rh: 6200

## 2018-06-10 LAB — CULTURE, RESPIRATORY W GRAM STAIN
Gram Stain: NONE SEEN
Special Requests: NORMAL

## 2018-06-10 LAB — URINE CULTURE
CULTURE: NO GROWTH
Special Requests: NORMAL

## 2018-06-10 LAB — CULTURE, RESPIRATORY

## 2018-06-11 LAB — CULTURE, BLOOD (ROUTINE X 2)
CULTURE: NO GROWTH
Culture: NO GROWTH

## 2018-06-13 LAB — CULTURE, BLOOD (ROUTINE X 2)
Culture: NO GROWTH
Special Requests: ADEQUATE

## 2018-06-14 LAB — CULTURE, BLOOD (ROUTINE X 2)
Culture: NO GROWTH
SPECIAL REQUESTS: ADEQUATE

## 2018-07-05 NOTE — Death Summary Note (Signed)
DEATH SUMMARY   Patient Details  Name: Alan Hess MRN: [id number] DOB: September 27, 1990  Admission/Discharge Information   Admit Date:  01-Jul-2018  Date of Death:  07/04/18  Time of Death:  15:21  Length of Stay: 4  Referring Physician: Patient, No Pcp Per   Reason(s) for Hospitalization  Post-Cardiac Arrest  Diagnoses  Preliminary cause of death:  Secondary Diagnoses (including complications and co-morbidities):  Active Problems:   Polysubstance abuse (HCC)   Cardiac arrest [facility])   Brief Hospital Course (including significant findings, care, treatment, and services provided and events leading to death)  Alan Hess is a 28 y.o. year old male with  ADD, anxiety/depression and hx of IVDA admitted to ICU post-cardiac arrest. Per mother, he was using heroin and sleeping when he was found cyanotic. Unclear how long patient was unresponsive and cyanotic. Family performed CPR for 10-15 minutes before EMS arrived and continued CPR for 16 minutes for PEA arrest. Hewas intubated and brought to [facility].  In the ICU, patient remained obtunded and unresponsive without sedation. UDS positive for amphetamine, benzo, opioids, cocaine and marijuana. CT head imaging consistent with severe anoxic injury and cerebral edema. Nuclear perfusion scan with no perfusion to brain. Apnea test performed and positive for brain death. Family meeting held with critical care and neurology team to discuss results of imaging. Family expressed wish to pursue organ donation.  Patient pronounced with brain death at 15:21. 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  Pertinent Labs and Studies  Significant Diagnostic Studies Dg Abd 1 View  Result Date: 06/06/2018 CLINICAL DATA:  OG tube placement EXAM: ABDOMEN - 1 VIEW COMPARISON:  2018/07/01 FINDINGS: Enteric tube terminates in the mid gastric body. Mildly prominent small bowel in the left mid abdomen,  mildly improved. Gastric distention is also improved. Visualized osseous structures are within normal limits. IMPRESSION: Enteric tube terminates in the mid gastric body. Electronically Signed   By: Charline Bills M.D.   On: 06/06/2018 08:48   Dg Abd 1 View  Result Date: 2018-07-01 CLINICAL DATA:  Orogastric tube placement. EXAM: ABDOMEN - 1 VIEW COMPARISON:  Abdominal radiograph performed earlier today at 7:08 p.m. FINDINGS: The patient's enteric tube is noted coiling at the antrum of the stomach. The stomach is diffusely distended with air, slightly improved from the prior study. The remaining bowel is also distended with air. No free intra-abdominal air is identified, though evaluation for free air is limited on a single supine view. No acute osseous abnormalities are identified. IMPRESSION: Enteric tube noted coiling at the antrum of the stomach; the stomach is diffusely distended with air, slightly improved from the prior study. The remaining bowel is also distended with air. Electronically Signed   By: Roanna Raider M.D.   On: 01-Jul-2018 23:04   Ct Head Wo Contrast  Result Date: 06/06/2018 CLINICAL DATA:  Encephalopathy EXAM: CT HEAD WITHOUT CONTRAST TECHNIQUE: Contiguous axial images were obtained from the base of the skull through the vertex without intravenous contrast. COMPARISON:  Yesterday FINDINGS: Brain: Symmetric low-density now seen within the cerebellum, deep gray nuclei, and patchy bilateral cortex. Even when accounting for streak artifact there is likely involvement of the brainstem. There is diffuse sulcal and cisternal effacement. The fourth ventricle is effaced without hydrocephalus. Subarachnoid high-density most consistent with pseudo subarachnoid sign. Vascular: As above Skull: No acute finding. Sinuses/Orbits: Intubation with sinus fluid levels. IMPRESSION: Severe global anoxic injury with extensive infarct in the supra and  infratentorial brain. There is elevated intracranial  pressure with generalized subarachnoid space effacement. Electronically Signed   By: Marnee Spring M.D.   On: 06/06/2018 10:05   Ct Head Wo Contrast  Result Date: Jun 28, 2018 CLINICAL DATA:  Altered level of consciousness (LOC), unexplained. Post CPR. EXAM: CT HEAD WITHOUT CONTRAST TECHNIQUE: Contiguous axial images were obtained from the base of the skull through the vertex without intravenous contrast. COMPARISON:  Head CT 07/30/2014 FINDINGS: Brain: Mild diffuse sulcal effacement suggesting cerebral edema. Questionable loss gray-white differentiation in the occipital lobes versus motion artifact. No intracranial hemorrhage. No hydrocephalus, a ventricular size similar to prior exam. The basilar cisterns remains patent. No subdural collection. Vascular: No hyperdense vessel, slight decreased density of the tentorium favored to be secondary to cerebral edema. Skull: No fracture or focal lesion. Sinuses/Orbits: Fluid levels within the paranasal sinuses most prominent in the sphenoid, may be secondary to intubation. Mastoid air cells are clear. Other: None. IMPRESSION: Findings consistent with mild diffuse cerebral edema with diffuse sulcal effacement. Questionable loss of gray-white differentiation in the occipital lobes versus motion artifact. These results were called by telephone at the time of interpretation on 06-28-18 at 7:32 pm to Dr. Criss Alvine , who verbally acknowledged these results. Electronically Signed   By: Narda Rutherford M.D.   On: 06/28/18 19:33   Mr Brain Wo Contrast  Result Date: 06/07/2018 CLINICAL DATA:  28 y/o M; found down, post 10-15 minutes CPR, EMS noted post PE cardiac arrest with approximately 16 minutes resuscitation to ROSC. Evaluate for hypoxic ischemic injury. EXAM: MRI HEAD WITHOUT CONTRAST TECHNIQUE: Multiplanar, multiecho pulse sequences of the brain and surrounding structures were obtained without intravenous contrast. COMPARISON:  06/06/2017 CT head FINDINGS: Brain:  Diffuse severe cerebral edema and reduced diffusion compatible with severe hypoxic ischemic injury. Mass effect with effacement of basilar cisterns and 19 mm cerebellar tonsillar herniation. Diffuse curvilinear low SWI signal within sulci over the convexity is likely vascular congestion. No findings of brain parenchymal hemorrhage. No hydrocephalus. Vascular: Normal flow voids. Skull and upper cervical spine: Normal marrow signal. Sinuses/Orbits: Bilateral mastoid air cell opacification and small maxillary sinus fluid levels, likely due to intubation. Other: None. IMPRESSION: Severe diffuse hypoxic ischemic injury of the brain, cerebral swelling, and downward herniation. No hemorrhage. These results will be called to the ordering clinician or representative by the Radiologist Assistant, and communication documented in the PACS or zVision Dashboard. Electronically Signed   By: Mitzi Hansen M.D.   On: 06/07/2018 16:51   Nm Brain W Vasc Flow Min 4v  Addendum Date: 06/19/2018   ADDENDUM REPORT: 06/06/2018 14:10 ADDENDUM: The following is a correction to the impression: 1. Absence of perfusion to the brain with no delayed cerebral or cerebellar uptake of the radiopharmaceutical. Imaging findings are compatible with the clinical diagnosis of brain death. Electronically Signed   By: Signa Kell M.D.   On: 06/04/2018 14:10   Result Date: 06/25/2018 CLINICAL DATA:  Hypoxic ischemic injury of the brain. Diffuse cerebral edema on recent MRI and CT. Clinical suspicion for brain death. EXAM: NM BRAIN SCAN WITH FLOW - 4+ VIEW TECHNIQUE: Radionuclide angiogram and static images of the brain were obtained after intravenous injection of radiopharmaceutical. RADIOPHARMACEUTICALS:  20.5 millicuries of technetium 99 M Ceretec COMPARISON:  Brain MRI 06/07/2018 and brain CT 06/06/2018. FINDINGS: On the flow phase portion of the examination there is no perfusion identified to the brain. Delayed, static planar images of  the brain were also obtained in the anterior, right and  left lateral orientations. No cerebral or cerebellar radiotracer uptake noted on the delayed images. IMPRESSION: 1. Absence of perfusion to the brain with no delayed cerebral or cerebellar uptake of the radiopharmaceutical. Imaging findings corroborate the clinical suspicion of brain death. Electronically Signed: By: Signa Kell M.D. On: 06/21/2018 13:29   Dg Chest Port 1 View  Result Date: 06/16/2018 CLINICAL DATA:  Organ donation. EXAM: PORTABLE CHEST 1 VIEW COMPARISON:  06/13/2018 FINDINGS: Endotracheal tube is stable and at the level of the clavicle heads. Left jugular central line in the SVC region. Nasogastric tube is coiled in left upper abdomen and appears to be in the stomach body region. Hazy densities in the hilar regions and lower lungs have minimally changed. No large areas of lung consolidation. Heart size is normal. Negative for pneumothorax. IMPRESSION: Stable chest radiograph findings. Haziness in the perihilar regions and lower lungs have minimally changed. Findings are suggestive for atelectasis, mild edema or mild airspace disease. Electronically Signed   By: Richarda Overlie M.D.   On: 06/10/2018 12:10   Dg Chest Port 1 View  Result Date: 06/15/2018 CLINICAL DATA:  Organ donation EXAM: PORTABLE CHEST 1 VIEW COMPARISON:  Two days ago FINDINGS: Endotracheal tube tip at the clavicular heads. An orogastric tube is coiled in the stomach. There is left IJ line with tip at the upper cavoatrial junction. Artifact from EKG leads. Hazy appearance of the bilateral chest that is similar to slightly improved. No Kerley lines, effusion, or pneumothorax. Normal heart size. IMPRESSION: 1. Unremarkable hardware positioning. 2. Hazy lung opacities that may be atelectasis, sequela of aspiration, or noncardiogenic edema. Electronically Signed   By: Marnee Spring M.D.   On: 06/23/2018 04:09   Dg Chest Port 1 View  Result Date: 06/07/2018 CLINICAL DATA:   28 year old male with hypoxia and severe anoxic brain injury status post recent overdose EXAM: PORTABLE CHEST 1 VIEW COMPARISON:  Prior chest x-ray 06/06/2018 FINDINGS: Patient remains intubated. The tip of the endotracheal tube is 6.3 cm above the carina. External defibrillator pads project over the chest. A left IJ approach central line is present. The tip overlies the mid SVC. A nasogastric tube is present. The tip lies inferior to the diaphragm, presumably within the stomach. Improved aeration with decreased interstitial and airspace opacities bilaterally. No pneumothorax. No pleural effusion. IMPRESSION: 1. External defibrillator pads now project over the left chest. 2. Otherwise, stable and satisfactory support apparatus. 3. Slight interval improvement in patchy bilateral airspace opacities. Electronically Signed   By: Malachy Moan M.D.   On: 06/07/2018 07:55   Dg Chest Port 1 View  Result Date: 06/06/2018 CLINICAL DATA:  Evaluate ET tube placement. EXAM: PORTABLE CHEST 1 VIEW COMPARISON:  06/06/2018 FINDINGS: ET tube tip is above the carina. There is a left sided IJ catheter with tip at the cavoatrial junction. The enteric tube tip is below the GE junction. Normal heart size. Bilateral interstitial and airspace densities are again noted. When compared with the previous exam these are slightly improved in the interval. IMPRESSION: 1. Mild improvement in aeration to both lungs. 2. Satisfactory position of support apparatus. Electronically Signed   By: Signa Kell M.D.   On: 06/06/2018 08:36   Dg Chest Port 1 View  Result Date: 06/06/2018 CLINICAL DATA:  Hypoxia, history cardiac arrest, question aspiration EXAM: PORTABLE CHEST 1 VIEW COMPARISON:  Portable exam 0755 hours compared to 0012 hours FINDINGS: Tip of endotracheal tube projects 7.5 cm above carina. Nasogastric tube extends into stomach. LEFT jugular central  venous catheter with tip projecting over proximal SVC. Normal heart size and  mediastinal contours. BILATERAL space infiltrates which could represent pneumonia, aspiration or less likely edema, little changed from previous exam. No pleural effusion or pneumothorax. Osseous structures unremarkable. IMPRESSION: Persistent BILATERAL airspace infiltrates question pneumonia versus aspiration, less likely edema. Electronically Signed   By: Ulyses Southward M.D.   On: 06/06/2018 08:15   Dg Chest Port 1 View  Result Date: 06/06/2018 CLINICAL DATA:  Encounter for central line placement. EXAM: PORTABLE CHEST 1 VIEW COMPARISON:  Radiograph yesterday at 2230 hour FINDINGS: Left internal jugular central venous catheter tip in the proximal SVC. No pneumothorax. Endotracheal tube tip 6 cm from the carina. Enteric tube in place, tip below the diaphragm not included in the field of view. The heart is normal in size. Do's bilateral perihilar opacities are similar to prior exam, slight improvement suspected in the right upper lung. No pleural fluid. IMPRESSION: 1. Tip of the left central line in the proximal SVC. No pneumothorax. 2. Bilateral perihilar opacities are similar to prior exam. Differential considerations include pulmonary edema versus aspiration/pneumonia. Electronically Signed   By: Narda Rutherford M.D.   On: 06/06/2018 00:57   Dg Chest Port 1 View  Result Date: 06/26/2018 CLINICAL DATA:  Endotracheal tube and orogastric tube placement. EXAM: PORTABLE CHEST 1 VIEW COMPARISON:  Chest radiograph performed earlier today at 5:50 p.m. FINDINGS: The patient's endotracheal tube is seen ending 6-7 cm above the carina. The patient's enteric tube is seen extending below the diaphragm. Diffuse bilateral airspace opacification raises concern for multifocal pneumonia. No definite pleural effusion or pneumothorax is seen, though the left costophrenic angle is incompletely imaged on this study. The cardiomediastinal silhouette is normal in size. No acute osseous abnormalities are identified. IMPRESSION: 1.  Endotracheal tube seen ending 6-7 cm above the carina. 2. Diffuse bilateral airspace opacification raises concern for multifocal pneumonia. Electronically Signed   By: Roanna Raider M.D.   On: 06/21/2018 23:03   Dg Chest Portable 1 View  Result Date: 06/07/2018 CLINICAL DATA:  ET tube placement.  Status post CPR. EXAM: PORTABLE CHEST 1 VIEW COMPARISON:  12/04/2008 FINDINGS: Endotracheal tube tip projects 3 cm above the carina. Cardiac silhouette is normal in size. No mediastinal or hilar masses or convincing adenopathy. There is hazy airspace opacity in the left perihilar and medial lung base distribution. Mild hazy airspace opacity is noted adjacent to the central lower lobe bronchovascular structures on the right. Remainder of the lungs is clear. Skeletal structures are grossly intact. IMPRESSION: 1. Well-positioned endotracheal tube. 2. Central lung opacities most evident on the left. Suspect asymmetric pulmonary edema. Consider pneumonia if there are consistent clinical findings. Electronically Signed   By: Amie Portland M.D.   On: 06/28/2018 18:14   Dg Abd Portable 1 View  Result Date: 06/06/2018 CLINICAL DATA:  Encounter for OG tube placement. EXAM: PORTABLE ABDOMEN - 1 VIEW COMPARISON:  None. FINDINGS: There is intestinal distension of the stomach, small bowel and RIGHT/transverse colon. The LEFT colon, sigmoid, and rectum are poorly visualized. Orogastric tube tip lies barely in the stomach, just past the gastroesophageal junction. The tube should be advanced several cm. IMPRESSION: Orogastric tube should be advanced several cm. Continued intestinal distension. See discussion above. Electronically Signed   By: Elsie Stain M.D.   On: 06/21/2018 19:43    Microbiology Recent Results (from the past 240 hour(s))  Culture, blood (routine x 2)     Status: None (Preliminary result)   Collection  Time: 07-05-18 12:45 AM  Result Value Ref Range Status   Specimen Description SITE NOT SPECIFIED  Final    Special Requests   Final    BOTTLES DRAWN AEROBIC ONLY Blood Culture results may not be optimal due to an inadequate volume of blood received in culture bottles Performed at [facility], [address] [place], [place] [address]    Culture NO GROWTH 3 DAYS  Final   Report Status PENDING  Incomplete  Culture, respiratory (tracheal aspirate)     Status: None   Collection Time: 05-Jul-2018 10:15 PM  Result Value Ref Range Status   Specimen Description TRACHEAL ASPIRATE  Final   Special Requests NONE  Final   Gram Stain   Final    MODERATE WBC PRESENT, PREDOMINANTLY PMN RARE GRAM POSITIVE COCCI    Culture   Final    RARE Consistent with normal respiratory flora. Performed at [facility], [address], [place], [place] [address]    Report Status 06/14/2018 FINAL  Final  MRSA PCR Screening     Status: None   Collection Time: 2018/07/05 10:32 PM  Result Value Ref Range Status   MRSA by PCR NEGATIVE NEGATIVE Final    Comment:        The GeneXpert MRSA Assay (FDA approved for NASAL specimens only), is one component of a comprehensive MRSA colonization surveillance program. It is not intended to diagnose MRSA infection nor to guide or monitor treatment for MRSA infections. Performed at [facility], [address], [place], [place] [address]   Culture, blood (routine x 2)     Status: None (Preliminary result)   Collection Time: 2018-07-05 11:27 PM  Result Value Ref Range Status   Specimen Description BLOOD LEFT HAND  Final   Special Requests   Final    BOTTLES DRAWN AEROBIC ONLY Blood Culture results may not be optimal due to an inadequate volume of blood received in culture bottles Performed at [facility], [address], [place], [place] [address]    Culture NO GROWTH 3 DAYS  Final   Report Status PENDING  Incomplete  Culture, blood (routine x 2) with sensitivity     Status: None (Preliminary result)   Collection Time: 06/04/2018  9:04 PM  Result Value  Ref Range Status   Specimen Description BLOOD RIGHT HAND  Final   Special Requests   Final    BOTTLES DRAWN AEROBIC AND ANAEROBIC Blood Culture adequate volume   Culture NO GROWTH < 24 HOURS  Final   Report Status PENDING  Incomplete  Culture, respiratory (non-expectorated)     Status: None (Preliminary result)   Collection Time: 06/13/2018  9:24 PM  Result Value Ref Range Status   Specimen Description TRACHEAL ASPIRATE  Final   Special Requests Normal  Final   Gram Stain NO WBC SEEN RARE YEAST   Final   Culture   Final    CULTURE REINCUBATED FOR BETTER GROWTH Performed at [facility], [address], [place], [place] [address]    Report Status PENDING  Incomplete  Culture, blood (Routine X 2) w Reflex to ID Panel     Status: None (Preliminary result)   Collection Time: 06/22/2018  7:57 AM  Result Value Ref Range Status   Specimen Description BLOOD LEFT HAND  Final   Special Requests   Final    BOTTLES DRAWN AEROBIC ONLY Blood Culture adequate volume Performed at [facility], [address] [place], [place] [id number]    Culture NO GROWTH < 12 HOURS  Final   Report Status PENDING  Incomplete    Lab Basic Metabolic Panel: Recent Labs  Lab 06/06/18 0357  06/07/18 0817  06/07/18 1750 06/23/2018 0028 06/14/2018 0615 06/26/2018 1015 06/29/2018 2113 Feb 21, 2019 0649 Feb 21, 2019 1400  NA 141   < > 151*   < > 153* 153* 154* 154* 153* 154* 154*  K 4.0   < > 3.9  --  3.7 3.8  --   --  3.1* 2.9* 2.9*  CL 109   < > 122*  --  126* 126*  --   --  123* 129* 124*  CO2 17*   < > 18*  --  14* 17*  --   --  17* 20* 20*  GLUCOSE 154*   < > 115*  --  122* 113*  --   --  141* 152* 128*  BUN 20   < > 15  --  18 19  --   --  23* 18 19  CREATININE 2.15*   < > 1.59*  --  1.43* 1.40*  --   --  1.23 1.02 1.22  CALCIUM 7.3*   < > 7.3*  --  7.7* 8.0*  --   --  8.3* 7.9* 7.4*  MG 1.6*  --  1.7  --   --   --   --   --  2.0  --   --   PHOS 3.9  --   --   --   --   --   --   --  <1.0* 2.0*  --    < >  = values in this interval not displayed.   Liver Function Tests: Recent Labs  Lab 06/13/2018 1759 06/06/18 0357 06/07/18 0535 06/14/2018 2113 Feb 21, 2019 1400  AST 1,035* 636* 102* 33 26  ALT 1,101* 614* 353* 165* 116*  ALKPHOS 93 59 48 65 69  BILITOT 1.2 0.8 0.8 1.7* 1.7*  PROT 5.8* 4.4* 4.4* 4.5* 4.1*  ALBUMIN 3.3* 2.3* 1.9* 1.8* 1.6*   Recent Labs  Lab 06/28/2018 2113  LIPASE 22  AMYLASE 29   No results for input(s): AMMONIA in the last 168 hours. CBC: Recent Labs  Lab 06/28/2018 1759 06/20/2018 1807 06/06/18 0455 06/07/18 0535 06/16/2018 1100 06/30/2018 2341  WBC 17.7*  --  3.2* 21.3* 27.5* 19.9*  NEUTROABS 14.3*  --   --   --   --  18.7*  HGB 14.7 15.3 18.1* 16.1 12.4* 10.1*  HCT 47.2 45.0 54.7* 47.3 35.9* 30.3*  MCV 99.8  --  91.8 91.3 91.3 89.9  PLT 518*  --  350 236 190 123*   Cardiac Enzymes: Recent Labs  Lab 06/06/18 1027 06/06/18 1400 06/06/18 1538 06/06/18 2130 06/07/2018 2113  CKTOTAL  --   --   --   --  113  CKMB  --   --   --   --  2.3  TROPONINI 1.84* 1.60* 1.51* 1.43* 0.17*   Sepsis Labs: Recent Labs  Lab 06/06/18 0455  06/06/18 1740 06/06/18 2100 06/07/18 0535 06/07/18 0738 06/07/18 1144 06/13/2018 1100 06/10/2018 2341  WBC 3.2*  --   --   --  21.3*  --   --  27.5* 19.9*  LATICACIDVEN  --    < > 8.1* 6.6*  --  8.8* 8.8*  --   --    < > = values in this interval not displayed.  Procedures/Operations  Bronchoscopy 06/27/2018   Jacon Whetzel Mechele CollinJane May Manrique 06/11/2018, 8:58 PM

## 2018-07-05 DEATH — deceased
